# Patient Record
Sex: Female | Born: 1975
Health system: Southern US, Community
[De-identification: ages and names within clinical notes are randomized; demographics above are authoritative.]

## PROBLEM LIST (undated history)

## (undated) DIAGNOSIS — R519 Headache, unspecified: Secondary | ICD-10-CM

## (undated) DIAGNOSIS — I499 Cardiac arrhythmia, unspecified: Secondary | ICD-10-CM

## (undated) DIAGNOSIS — Z0000 Encounter for general adult medical examination without abnormal findings: Secondary | ICD-10-CM

## (undated) DIAGNOSIS — I10 Essential (primary) hypertension: Secondary | ICD-10-CM

## (undated) DIAGNOSIS — R112 Nausea with vomiting, unspecified: Secondary | ICD-10-CM

## (undated) DIAGNOSIS — Z9889 Other specified postprocedural states: Secondary | ICD-10-CM

## (undated) DIAGNOSIS — R51 Headache: Secondary | ICD-10-CM

## (undated) DIAGNOSIS — E782 Mixed hyperlipidemia: Secondary | ICD-10-CM

## (undated) DIAGNOSIS — E669 Obesity, unspecified: Secondary | ICD-10-CM

## (undated) DIAGNOSIS — G5600 Carpal tunnel syndrome, unspecified upper limb: Secondary | ICD-10-CM

## (undated) DIAGNOSIS — B079 Viral wart, unspecified: Secondary | ICD-10-CM

## (undated) HISTORY — DX: Obesity, unspecified: E66.9

## (undated) HISTORY — DX: Mixed hyperlipidemia: E78.2

## (undated) HISTORY — DX: Carpal tunnel syndrome, unspecified upper limb: G56.00

## (undated) HISTORY — PX: OTHER SURGICAL HISTORY: SHX169

## (undated) HISTORY — DX: Encounter for general adult medical examination without abnormal findings: Z00.00

## (undated) HISTORY — DX: Viral wart, unspecified: B07.9

## (undated) HISTORY — DX: Essential (primary) hypertension: I10

---

## 2006-11-24 ENCOUNTER — Ambulatory Visit (HOSPITAL_COMMUNITY): Admission: RE | Admit: 2006-11-24 | Discharge: 2006-11-24 | Payer: Self-pay | Admitting: Obstetrics and Gynecology

## 2006-11-24 ENCOUNTER — Encounter (INDEPENDENT_AMBULATORY_CARE_PROVIDER_SITE_OTHER): Payer: Self-pay | Admitting: Obstetrics and Gynecology

## 2007-03-14 ENCOUNTER — Encounter (INDEPENDENT_AMBULATORY_CARE_PROVIDER_SITE_OTHER): Payer: Self-pay | Admitting: Obstetrics and Gynecology

## 2007-03-14 ENCOUNTER — Ambulatory Visit (HOSPITAL_COMMUNITY): Admission: RE | Admit: 2007-03-14 | Discharge: 2007-03-14 | Payer: Self-pay | Admitting: Obstetrics and Gynecology

## 2010-07-24 NOTE — Op Note (Signed)
NAMEJACLYN, Tammy Brennan             ACCOUNT NO.:  [id number]   MEDICAL RECORD NO.:  [id number]          PATIENT TYPE:  AMB   LOCATION:  SDC                           FACILITY:  [facility]   PHYSICIAN:  Malva Limes, M.D.    DATE OF BIRTH:  1975/09/09   DATE OF PROCEDURE:  11/24/2006  DATE OF DISCHARGE:                               OPERATIVE REPORT   PREOPERATIVE DIAGNOSIS:  Missed abortion.   POSTOPERATIVE DIAGNOSIS:  Missed abortion.   PROCEDURE:  Dilation and evacuation.   SURGEON:  Dareen Piano.   ANESTHESIA:  MAC with paracervical block.   DRAINS:  None.   ANTIBIOTICS:  Ancef 1 gram.   COMPLICATIONS:  None.   SPECIMENS:  Products of conception sent to Pathology.   PROCEDURE:  The patient was taken to the operating room where she was  placed in the dorsal lithotomy position.  The patient was administered a  MAC anesthetic without difficulty.  She was then draped in the usual  fashion for this procedure.  A sterile speculum was placed in the  vagina.  Lidocaine 1%, 20 mL, was used for a paracervical block.  A  single-tooth tenaculum was applied to the anterior cervical lip.  The  cervix was then thoroughly dilated to a 90 [place].  The 9 mm suction  cannula was placed into the uterine cavity and products of conception  withdrawn.  Sharp curettage was performed followed by repeat suction.  This concluded the procedure.  Patient was taken to the recovery room in  stable condition.  Instrument and lap counts were correct x1.  The  patient will be discharged to home.  She will be sent home with Advil  and Vicodin.  She will follow up in the office in 4 weeks.           ______________________________  Malva Limes, M.D.     MA/MEDQ  D:  11/24/2006  T:  11/24/2006  Job:  [id number]

## 2010-07-24 NOTE — Op Note (Signed)
Tammy Brennan, Tammy Brennan             ACCOUNT NO.:  [id number]   MEDICAL RECORD NO.:  [id number]          PATIENT TYPE:  AMB   LOCATION:  SDC                           FACILITY:  [facility]   PHYSICIAN:  Lenoard Aden, M.D.DATE OF BIRTH:  [birth year]   DATE OF PROCEDURE:  03/14/2007  DATE OF DISCHARGE:                               OPERATIVE REPORT   PREOPERATIVE DIAGNOSIS:  Missed abortion.   POSTOPERATIVE DIAGNOSIS:  Missed abortion.   OPERATION/PROCEDURE:  Suction dilation and evacuation.   SURGEON:  Lenoard Aden, M.D.   ANESTHESIA:  MAC paracervical.   BLOOD LOSS:  Less than 50 mL.   COMPLICATIONS:  None.   DRAINS:  None.   COUNTS:  Correct.   DISPOSITION:  To recovery in good condition.   SPECIMENS:  Products of conception sent for tissue confirmation,  chromosomes, FISH,  and to pathology.   DESCRIPTION OF PROCEDURE:  After being apprised of risks of anesthesia,  infection, bleeding, intra-abdominal injury and need for repair, delayed  versus immediate  complications to include bowel and bladder injury, the  patient was brought to the operating.  She was administered an IV  sedation without difficulty, prepped and draped in sterile fashion.  Catheterized until the bladder was empty.  After achieving adequate  anesthesia, dilute Marcaine and dilute Xylocaine solution is placed in  standard paracervical block, 40 mL total.  At this time the uterus was  sounded to 10 cm, easily dilated up to #23 Pratt dilator.  Suction curet  #7 was placed without difficulty.  Suction curettage was performed  revealing products of conception as noted.  Repeat suction and then  curettage in a four-quadrant method reveals cavity to be empty.  Good  hemostasis noted.  All instruments removed from the vagina.  The patient  tolerates procedure well and is transferred to recovery in good  condition.      Lenoard Aden, M.D.  Electronically Signed    RJT/MEDQ  D:  03/14/2007  T:   03/14/2007  Job:  [id number]

## 2010-11-29 LAB — CBC
HCT: 40.5
MCHC: 35.3
MCV: 90
Platelets: 414 — ABNORMAL HIGH
RDW: 12.2

## 2010-12-20 LAB — CBC
MCHC: 35.1
MCV: 89.3
Platelets: 425 — ABNORMAL HIGH
RDW: 12.4
WBC: 10.6 — ABNORMAL HIGH

## 2014-06-14 ENCOUNTER — Telehealth: Payer: Self-pay

## 2014-06-14 NOTE — Telephone Encounter (Signed)
error:[id number] ° °

## 2014-06-20 ENCOUNTER — Ambulatory Visit (INDEPENDENT_AMBULATORY_CARE_PROVIDER_SITE_OTHER): Payer: BLUE CROSS/BLUE SHIELD | Admitting: Medical

## 2014-06-20 ENCOUNTER — Encounter: Payer: Self-pay | Admitting: Medical

## 2014-06-20 VITALS — BP 131/90 | HR 102 | Temp 98.3°F | Ht <= 58 in | Wt 199.8 lb

## 2014-06-20 DIAGNOSIS — M79604 Pain in right leg: Secondary | ICD-10-CM

## 2014-06-20 DIAGNOSIS — G5601 Carpal tunnel syndrome, right upper limb: Secondary | ICD-10-CM | POA: Diagnosis not present

## 2014-06-20 DIAGNOSIS — G5600 Carpal tunnel syndrome, unspecified upper limb: Secondary | ICD-10-CM | POA: Insufficient documentation

## 2014-06-20 DIAGNOSIS — M25561 Pain in right knee: Secondary | ICD-10-CM | POA: Diagnosis not present

## 2014-06-20 DIAGNOSIS — G5602 Carpal tunnel syndrome, left upper limb: Secondary | ICD-10-CM

## 2014-06-20 DIAGNOSIS — G5603 Carpal tunnel syndrome, bilateral upper limbs: Secondary | ICD-10-CM

## 2014-06-20 HISTORY — DX: Pain in right leg: M79.604

## 2014-06-20 HISTORY — DX: Carpal tunnel syndrome, unspecified upper limb: G56.00

## 2014-06-20 MED ORDER — PREGABALIN 75 MG PO CAPS: 75.0000 mg | ORAL_CAPSULE | Freq: Two times a day (BID) | ORAL | Status: DC

## 2014-06-20 NOTE — Progress Notes (Signed)
Pre visit review using our clinic review tool, if applicable. No additional management support is needed unless otherwise documented below in the visit note. 

## 2014-06-20 NOTE — Patient Instructions (Signed)
Pain in joint, lower leg By history and description. May be lumbosacral issue/diagosis. Will try to get old records quickly. Please sign release of information forms.  Will rx lyrica and see if this helps with pain.  If pain worsens or changes let us know.   Carpal tunnel syndrome Continue splints and can use otc nsaids. Will refer to hand specialist.     Follow up in 2 weeks or as needed.

## 2014-06-20 NOTE — Progress Notes (Signed)
Subjective:    Patient ID: Tammy Brennan, female    DOB: 04/22/1975, 39 y.o.   MRN: [id number]  HPI     OI have reviewed pt PMH, PSH, FH, Social History and Surgical History  No PMH No PSH. Father- lung cancer, lymphoma.    Married- 2 children(  41 yo and 93 yo). Pt is Runner, broadcasting/film/videoteacher at [employer]. No exercise, No caffeine. Some college Education.  Pt in stating she has had some back pain sine mid 2014. Pt states pain onset may have occurred after falling down stairs. But has some degenerative changes of back. She was told was pars defect. Pt states has chronic on and off pain. At one point toward end of 2014. She go a steroid injection and was pain free for 6 months. Then later got second injection that did not help much.  Pt in past was given gabapentin but this did not help much. She was on gabapentin 100 mg tid. Pt stopped this. Pt tried hydrocodone in th past and it did not help either.    Pt had Mri done at [facility] then [facility]   Pt last saw Spine and Pain specialist in May or June. Before they moved.   Pt states pain when she is on her feet. At rest instant relief. She pain more in leg when standing. But injection was given in LS area.  But pain reported more in lateral aspect of upper thigh and all the way down her leg.  Also some bilateral CTS. Mostly at night. + emg studies both hands. Pt only has symptoms at night. Not having symptoms during the day.   LMP- 2 wks. Tubal ligation as well.  No saddle anesthesia. No leg weakness or incontinence reported.          Review of Systems  Constitutional: Negative for fever, chills and fatigue.  Respiratory: Negative for cough, chest tightness and wheezing.   Cardiovascular: Negative for chest pain and palpitations.  Gastrointestinal: Negative for nausea, vomiting and abdominal pain.  Genitourinary: Negative for dysuria, urgency, hematuria and flank pain.  Musculoskeletal:         Lt leg pain.   Upper ext hands numb and pain at night when sleeping.   Neurological: Negative for weakness and numbness.   Past Medical History  Diagnosis Date  . Carpal tunnel syndrome 06/20/2014    History   Social History  . Marital Status: Married    Spouse Name: N/A  . Number of Children: N/A  . Years of Education: N/A   Occupational History  . Not on file.   Social History Main Topics  . Smoking status: Never Smoker   . Smokeless tobacco: Never Used  . Alcohol Use: No  . Drug Use: No  . Sexual Activity: Yes   Other Topics Concern  . Not on file   Social History Narrative  . No narrative on file    No past surgical history on file.  Family History  Problem Relation Age of Onset  . Hypertension Mother   . Hypertension Father   . Cancer Father     No Known Allergies  No current outpatient prescriptions on file prior to visit.   No current facility-administered medications on file prior to visit.    BP 131/90 mmHg  Pulse 102  Temp(Src) 98.3 F (36.8 C) (Oral)  Ht 4\' 3"  (1.295 m)  Wt 199 lb 12.8 oz (90.629 kg)  BMI 54.04  kg/m2  SpO2 98%  LMP 06/06/2014      Objective:   Physical Exam  General Appearance- Not in acute distress.    Chest and Lung Exam Auscultation: Breath sounds:-Normal. Clear even and unlabored. Adventitious sounds:- No Adventitious sounds.  Cardiovascular Auscultation:Rythm - Regular, rate and rythm. Heart Sounds -Normal heart sounds.  Abdomen Inspection:-Inspection Normal.  Palpation/Perucssion: Palpation and Percussion of the abdomen reveal- Non Tender, No Rebound tenderness, No rigidity(Guarding) and No Palpable abdominal masses.  Liver:-Normal.  Spleen:- Normal.   Back Mid lumbar spine no  tenderness to palpation. No pain on straight leg lift. No pain on lateral movements and flexion/extension of the spine.  Lower ext neurologic  L5-S1 sensation intact bilaterally. Normal patellar reflexes  bilaterally. No foot drop bilaterally.  Lt hip- no pain on rom Lt leg- no pain on palpation. But standing reports left thigh area pain that presents throughout entire leg to her foot.  Upper ext- negative phalens signs.      Assessment & Plan:

## 2014-06-20 NOTE — Assessment & Plan Note (Signed)
Continue splints and can use otc nsaids. Will refer to hand specialist.

## 2014-06-20 NOTE — Assessment & Plan Note (Signed)
By history and description. May be lumbosacral issue/diagosis. Will try to get old records quickly. Please sign release of information forms.  Will rx lyrica and see if this helps with pain.  If pain worsens or changes let us know.

## 2014-07-04 ENCOUNTER — Encounter: Payer: Self-pay | Admitting: Family Medicine

## 2014-07-07 ENCOUNTER — Ambulatory Visit: Payer: BLUE CROSS/BLUE SHIELD | Admitting: Medical

## 2014-07-11 ENCOUNTER — Other Ambulatory Visit (HOSPITAL_BASED_OUTPATIENT_CLINIC_OR_DEPARTMENT_OTHER): Payer: Self-pay | Admitting: Neurosurgery

## 2014-07-11 DIAGNOSIS — M47817 Spondylosis without myelopathy or radiculopathy, lumbosacral region: Secondary | ICD-10-CM

## 2014-07-13 ENCOUNTER — Encounter: Payer: Self-pay | Admitting: Family Medicine

## 2014-07-16 ENCOUNTER — Ambulatory Visit (HOSPITAL_BASED_OUTPATIENT_CLINIC_OR_DEPARTMENT_OTHER)
Admission: RE | Admit: 2014-07-16 | Discharge: 2014-07-16 | Disposition: A | Discharge status: Home / Self Care | Payer: BLUE CROSS/BLUE SHIELD | Source: Ambulatory Visit | Attending: Neurosurgery | Admitting: Neurosurgery

## 2014-07-16 DIAGNOSIS — M4806 Spinal stenosis, lumbar region: Secondary | ICD-10-CM | POA: Diagnosis not present

## 2014-07-16 DIAGNOSIS — M4307 Spondylolysis, lumbosacral region: Secondary | ICD-10-CM | POA: Insufficient documentation

## 2014-07-16 DIAGNOSIS — M4317 Spondylolisthesis, lumbosacral region: Secondary | ICD-10-CM | POA: Insufficient documentation

## 2014-07-16 DIAGNOSIS — M47817 Spondylosis without myelopathy or radiculopathy, lumbosacral region: Secondary | ICD-10-CM | POA: Insufficient documentation

## 2014-07-28 DIAGNOSIS — M5416 Radiculopathy, lumbar region: Secondary | ICD-10-CM | POA: Insufficient documentation

## 2014-07-28 DIAGNOSIS — M5136 Other intervertebral disc degeneration, lumbar region: Secondary | ICD-10-CM

## 2014-07-28 DIAGNOSIS — M51369 Other intervertebral disc degeneration, lumbar region without mention of lumbar back pain or lower extremity pain: Secondary | ICD-10-CM

## 2014-07-28 HISTORY — DX: Other intervertebral disc degeneration, lumbar region without mention of lumbar back pain or lower extremity pain: M51.369

## 2014-07-28 HISTORY — DX: Radiculopathy, lumbar region: M54.16

## 2014-09-26 ENCOUNTER — Telehealth: Payer: Self-pay | Admitting: Behavioral Health

## 2014-09-26 NOTE — Telephone Encounter (Signed)
Unable to reach patient at time of Pre-Visit Call.  Left message for patient to return call when available.    

## 2014-09-27 ENCOUNTER — Ambulatory Visit (INDEPENDENT_AMBULATORY_CARE_PROVIDER_SITE_OTHER): Payer: BLUE CROSS/BLUE SHIELD | Admitting: Family Medicine

## 2014-09-27 ENCOUNTER — Encounter: Payer: Self-pay | Admitting: Family Medicine

## 2014-09-27 VITALS — BP 108/70 | HR 78 | Temp 98.2°F | Ht 62.0 in | Wt 196.1 lb

## 2014-09-27 DIAGNOSIS — E782 Mixed hyperlipidemia: Secondary | ICD-10-CM

## 2014-09-27 DIAGNOSIS — G5603 Carpal tunnel syndrome, bilateral upper limbs: Secondary | ICD-10-CM

## 2014-09-27 DIAGNOSIS — E669 Obesity, unspecified: Secondary | ICD-10-CM | POA: Diagnosis not present

## 2014-09-27 DIAGNOSIS — G5602 Carpal tunnel syndrome, left upper limb: Secondary | ICD-10-CM | POA: Diagnosis not present

## 2014-09-27 DIAGNOSIS — R5383 Other fatigue: Secondary | ICD-10-CM

## 2014-09-27 DIAGNOSIS — M79604 Pain in right leg: Secondary | ICD-10-CM

## 2014-09-27 DIAGNOSIS — G5601 Carpal tunnel syndrome, right upper limb: Secondary | ICD-10-CM | POA: Diagnosis not present

## 2014-09-27 DIAGNOSIS — B079 Viral wart, unspecified: Secondary | ICD-10-CM

## 2014-09-27 DIAGNOSIS — Z0000 Encounter for general adult medical examination without abnormal findings: Secondary | ICD-10-CM

## 2014-09-27 HISTORY — DX: Encounter for general adult medical examination without abnormal findings: Z00.00

## 2014-09-27 LAB — COMPREHENSIVE METABOLIC PANEL
ALK PHOS: 76 U/L (ref 39–117)
ALT: 12 U/L (ref 0–35)
AST: 14 U/L (ref 0–37)
Albumin: 3.7 g/dL (ref 3.5–5.2)
BUN: 17 mg/dL (ref 6–23)
CALCIUM: 9.3 mg/dL (ref 8.4–10.5)
CO2: 27 mEq/L (ref 19–32)
CREATININE: 0.59 mg/dL (ref 0.40–1.20)
Chloride: 103 mEq/L (ref 96–112)
GFR: 119.51 mL/min (ref >60.00)
Glucose, Bld: 85 mg/dL (ref 70–99)
POTASSIUM: 3.9 meq/L (ref 3.5–5.1)
Sodium: 137 mEq/L (ref 135–145)
Total Bilirubin: 0.3 mg/dL (ref 0.2–1.2)
Total Protein: 6.4 g/dL (ref 6.0–8.3)

## 2014-09-27 LAB — LIPID PANEL
Cholesterol: 182 mg/dL (ref 0–200)
HDL: 66.1 mg/dL (ref >39.00)
NONHDL: 115.9
Total CHOL/HDL Ratio: 3
Triglycerides: 231 mg/dL — ABNORMAL HIGH (ref 0.0–149.0)
VLDL: 46.2 mg/dL — AB (ref 0.0–40.0)

## 2014-09-27 LAB — CBC
HEMATOCRIT: 37.5 % (ref 36.0–46.0)
HEMOGLOBIN: 12.6 g/dL (ref 12.0–15.0)
MCHC: 33.7 g/dL (ref 30.0–36.0)
MCV: 88.8 fl (ref 78.0–100.0)
Platelets: 379 10*3/uL (ref 150.0–400.0)
RBC: 4.22 Mil/uL (ref 3.87–5.11)
RDW: 13.6 % (ref 11.5–15.5)
WBC: 10.4 10*3/uL (ref 4.0–10.5)

## 2014-09-27 LAB — LDL CHOLESTEROL, DIRECT: Direct LDL: 94 mg/dL

## 2014-09-27 LAB — TSH: TSH: 2.14 u[IU]/mL (ref 0.35–4.50)

## 2014-09-27 LAB — T4, FREE: FREE T4: 0.86 ng/dL (ref 0.60–1.60)

## 2014-09-27 NOTE — Patient Instructions (Addendum)
Folic acid 1 mg daily to help kill of viruses.  Salon pas patches after ice over wrists for 10-15 minutes.  Encourage DASH diet, decrease po intake and increase exercise as tolerated. Needs 7-8 hours of sleep nightly. Avoid trans fats, eat small, frequent meals every 4-5 hours with lean proteins, complex carbs and healthy fats. Minimize simple carbs, GMO foods. [employer] magazines    Preventive Care for Adults A healthy lifestyle and preventive care can promote health and wellness. Preventive health guidelines for women include the following key practices.  A routine yearly physical is a good way to check with your health care provider about your health and preventive screening. It is a chance to share any concerns and updates  your health and to receive a thorough exam.  Visit your dentist for a routine exam and preventive care every 6 months. Brush your teeth twice a day and floss once a day. Good oral hygiene prevents tooth decay and gum disease.  The frequency of eye exams is based on your age, health, family medical history, use of contact lenses, and other factors. Follow your health care provider's recommendations for frequency of eye exams.  Eat a healthy diet. Foods like vegetables, fruits, whole grains, low-fat dairy products, and lean protein foods contain the nutrients you need without too many calories. Decrease your intake of foods high in solid fats, added sugars, and salt. Eat the right amount of calories for you.Get information about a proper diet from your health care provider, if necessary.  Regular physical exercise is one of the most important things you can do for your health. Most adults should get at least 150 minutes of moderate-intensity exercise (any activity that increases your heart rate and causes you to sweat) each week. In addition, most adults need muscle-strengthening exercises on 2 or more days a week.  Maintain a healthy weight. The body  mass index (BMI) is a screening tool to identify possible weight problems. It provides an estimate of body fat based on height and weight. Your health care provider can find your BMI and can help you achieve or maintain a healthy weight.For adults 20 years and older:  A BMI below 18.5 is considered underweight.  A BMI of 18.5 to 24.9 is normal.  A BMI of 25 to 29.9 is considered overweight.  A BMI of 30 and above is considered obese.  Maintain normal blood lipids and cholesterol levels by exercising and minimizing your intake of saturated fat. Eat a balanced diet with plenty of fruit and vegetables. Blood tests for lipids and cholesterol should begin at age 82 and be repeated every 5 years. If your lipid or cholesterol levels are high, you are over 50, or you are at high risk for heart disease, you may need your cholesterol levels checked more frequently.Ongoing high lipid and cholesterol levels should be treated with medicines if diet and exercise are not working.  If you smoke, find out from your health care provider how to quit. If you do not use tobacco, do not start.  Lung cancer screening is recommended for adults aged 11-80 years who are at high risk for developing lung cancer because of a history of smoking. A yearly low-dose CT scan of the lungs is recommended for people who have at least a 30-pack-year history of smoking and are a current smoker or have quit within the past 15 years. A pack year of smoking is smoking an average of 1 pack of cigarettes a  day for 1 year (for example: 1 pack a day for 30 years or 2 packs a day for 15 years). Yearly screening should continue until the smoker has stopped smoking for at least 15 years. Yearly screening should be stopped for people who develop a health problem that would prevent them from having lung cancer treatment.  If you are pregnant, do not drink alcohol. If you are breastfeeding, be very cautious about drinking alcohol. If you are not  pregnant and choose to drink alcohol, do not have more than 1 drink per day. One drink is considered to be 12 ounces (355 mL) of beer, 5 ounces (148 mL) of wine, or 1.5 ounces (44 mL) of liquor.  Avoid use of street drugs. Do not share needles with anyone. Ask for help if you need support or instructions about stopping the use of drugs.  High blood pressure causes heart disease and increases the risk of stroke. Your blood pressure should be checked at least every 1 to 2 years. Ongoing high blood pressure should be treated with medicines if weight loss and exercise do not work.  If you are 44-74 years old, ask your health care provider if you should take aspirin to prevent strokes.  Diabetes screening involves taking a blood sample to check your fasting blood sugar level. This should be done once every 3 years, after age 74, if you are within normal weight and without risk factors for diabetes. Testing should be considered at a younger age or be carried out more frequently if you are overweight and have at least 1 risk factor for diabetes.  Breast cancer screening is essential preventive care for women. You should practice "breast self-awareness." This means understanding the normal appearance and feel of your breasts and may include breast self-examination. Any changes detected, no matter how small, should be reported to a health care provider. Women in their 88s and 30s should have a clinical breast exam (CBE) by a health care provider as part of a regular health exam every 1 to 3 years. After age 24, women should have a CBE every year. Starting at age 66, women should consider having a mammogram (breast X-ray test) every year. Women who have a family history of breast cancer should talk to their health care provider about genetic screening. Women at a high risk of breast cancer should talk to their health care providers about having an MRI and a mammogram every year.  Breast cancer gene (BRCA)-related  cancer risk assessment is recommended for women who have family members with BRCA-related cancers. BRCA-related cancers include breast, ovarian, tubal, and peritoneal cancers. Having family members with these cancers may be associated with an increased risk for harmful changes (mutations) in the breast cancer genes BRCA1 and BRCA2. Results of the assessment will determine the need for genetic counseling and BRCA1 and BRCA2 testing.  Routine pelvic exams to screen for cancer are no longer recommended for nonpregnant women who are considered low risk for cancer of the pelvic organs (ovaries, uterus, and vagina) and who do not have symptoms. Ask your health care provider if a screening pelvic exam is right for you.  If you have had past treatment for cervical cancer or a condition that could lead to cancer, you need Pap tests and screening for cancer for at least 20 years after your treatment. If Pap tests have been discontinued, your risk factors (such as having a new sexual partner) need to be reassessed to determine if screening should be  resumed. Some women have medical problems that increase the chance of getting cervical cancer. In these cases, your health care provider may recommend more frequent screening and Pap tests.  The HPV test is an additional test that may be used for cervical cancer screening. The HPV test looks for the virus that can cause the cell changes on the cervix. The cells collected during the Pap test can be tested for HPV. The HPV test could be used to screen women aged 57 years and older, and should be used in women of any age who have unclear Pap test results. After the age of 71, women should have HPV testing at the same frequency as a Pap test.  Colorectal cancer can be detected and often prevented. Most routine colorectal cancer screening begins at the age of 77 years and continues through age 37 years. However, your health care provider may recommend screening at an earlier age  if you have risk factors for colon cancer. On a yearly basis, your health care provider may provide home test kits to check for hidden blood in the stool. Use of a small camera at the end of a tube, to directly examine the colon (sigmoidoscopy or colonoscopy), can detect the earliest forms of colorectal cancer. Talk to your health care provider about this at age 89, when routine screening begins. Direct exam of the colon should be repeated every 5-10 years through age 73 years, unless early forms of pre-cancerous polyps or small growths are found.  People who are at an increased risk for hepatitis B should be screened for this virus. You are considered at high risk for hepatitis B if:  You were born in a country where hepatitis B occurs often. Talk with your health care provider about which countries are considered high risk.  Your parents were born in a high-risk country and you have not received a shot to protect against hepatitis B (hepatitis B vaccine).  You have HIV or AIDS.  You use needles to inject street drugs.  You live with, or have sex with, someone who has hepatitis B.  You get hemodialysis treatment.  You take certain medicines for conditions like cancer, organ transplantation, and autoimmune conditions.  Hepatitis C blood testing is recommended for all people born from [birth year] through 1965 and any individual with known risks for hepatitis C.  Practice safe sex. Use condoms and avoid high-risk sexual practices to reduce the spread of sexually transmitted infections (STIs). STIs include gonorrhea, chlamydia, syphilis, trichomonas, herpes, HPV, and human immunodeficiency virus (HIV). Herpes, HIV, and HPV are viral illnesses that have no cure. They can result in disability, cancer, and death.  You should be screened for sexually transmitted illnesses (STIs) including gonorrhea and chlamydia if:  You are sexually active and are younger than 24 years.  You are older than 24 years and  your health care provider tells you that you are at risk for this type of infection.  Your sexual activity has changed since you were last screened and you are at an increased risk for chlamydia or gonorrhea. Ask your health care provider if you are at risk.  If you are at risk of being infected with HIV, it is recommended that you take a prescription medicine daily to prevent HIV infection. This is called preexposure prophylaxis (PrEP). You are considered at risk if:  You are a heterosexual woman, are sexually active, and are at increased risk for HIV infection.  You take drugs by injection.  You  are sexually active with a partner who has HIV.  Talk with your health care provider about whether you are at high risk of being infected with HIV. If you choose to begin PrEP, you should first be tested for HIV. You should then be tested every 3 months for as long as you are taking PrEP.  Osteoporosis is a disease in which the bones lose minerals and strength with aging. This can result in serious bone fractures or breaks. The risk of osteoporosis can be identified using a bone density scan. Women ages 58 years and over and women at risk for fractures or osteoporosis should discuss screening with their health care providers. Ask your health care provider whether you should take a calcium supplement or vitamin D to reduce the rate of osteoporosis.  Menopause can be associated with physical symptoms and risks. Hormone replacement therapy is available to decrease symptoms and risks. You should talk to your health care provider about whether hormone replacement therapy is right for you.  Use sunscreen. Apply sunscreen liberally and repeatedly throughout the day. You should seek shade when your shadow is shorter than you. Protect yourself by wearing long sleeves, pants, a wide-brimmed hat, and sunglasses year round, whenever you are outdoors.  Once a month, do a whole body skin exam, using a mirror to look  at the skin on your back. Tell your health care provider of new moles, moles that have irregular borders, moles that are larger than a pencil eraser, or moles that have changed in shape or color.  Stay current with required vaccines (immunizations).  Influenza vaccine. All adults should be immunized every year.  Tetanus, diphtheria, and acellular pertussis (Td, Tdap) vaccine. Pregnant women should receive 1 dose of Tdap vaccine during each pregnancy. The dose should be obtained regardless of the length of time since the last dose. Immunization is preferred during the 27th-36th week of gestation. An adult who has not previously received Tdap or who does not know her vaccine status should receive 1 dose of Tdap. This initial dose should be followed by tetanus and diphtheria toxoids (Td) booster doses every 10 years. Adults with an unknown or incomplete history of completing a 3-dose immunization series with Td-containing vaccines should begin or complete a primary immunization series including a Tdap dose. Adults should receive a Td booster every 10 years.  Varicella vaccine. An adult without evidence of immunity to varicella should receive 2 doses or a second dose if she has previously received 1 dose. Pregnant females who do not have evidence of immunity should receive the first dose after pregnancy. This first dose should be obtained before leaving the health care facility. The second dose should be obtained 4-8 weeks after the first dose.  Human papillomavirus (HPV) vaccine. Females aged 13-26 years who have not received the vaccine previously should obtain the 3-dose series. The vaccine is not recommended for use in pregnant females. However, pregnancy testing is not needed before receiving a dose. If a female is found to be pregnant after receiving a dose, no treatment is needed. In that case, the remaining doses should be delayed until after the pregnancy. Immunization is recommended for any person  with an immunocompromised condition through the age of 10 years if she did not get any or all doses earlier. During the 3-dose series, the second dose should be obtained 4-8 weeks after the first dose. The third dose should be obtained 24 weeks after the first dose and 16 weeks after the second  dose.  Zoster vaccine. One dose is recommended for adults aged 81 years or older unless certain conditions are present.  Measles, mumps, and rubella (MMR) vaccine. Adults born before [birth year] generally are considered immune to measles and mumps. Adults born in [birth year] or later should have 1 or more doses of MMR vaccine unless there is a contraindication to the vaccine or there is laboratory evidence of immunity to each of the three diseases. A routine second dose of MMR vaccine should be obtained at least 28 days after the first dose for students attending postsecondary schools, health care workers, or international travelers. People who received inactivated measles vaccine or an unknown type of measles vaccine during 1963-1967 should receive 2 doses of MMR vaccine. People who received inactivated mumps vaccine or an unknown type of mumps vaccine before 1979 and are at high risk for mumps infection should consider immunization with 2 doses of MMR vaccine. For females of childbearing age, rubella immunity should be determined. If there is no evidence of immunity, females who are not pregnant should be vaccinated. If there is no evidence of immunity, females who are pregnant should delay immunization until after pregnancy. Unvaccinated health care workers born before [birth year] who lack laboratory evidence of measles, mumps, or rubella immunity or laboratory confirmation of disease should consider measles and mumps immunization with 2 doses of MMR vaccine or rubella immunization with 1 dose of MMR vaccine.  Pneumococcal 13-valent conjugate (PCV13) vaccine. When indicated, a person who is uncertain of her immunization history and has  no record of immunization should receive the PCV13 vaccine. An adult aged 69 years or older who has certain medical conditions and has not been previously immunized should receive 1 dose of PCV13 vaccine. This PCV13 should be followed with a dose of pneumococcal polysaccharide (PPSV23) vaccine. The PPSV23 vaccine dose should be obtained at least 8 weeks after the dose of PCV13 vaccine. An adult aged 24 years or older who has certain medical conditions and previously received 1 or more doses of PPSV23 vaccine should receive 1 dose of PCV13. The PCV13 vaccine dose should be obtained 1 or more years after the last PPSV23 vaccine dose.  Pneumococcal polysaccharide (PPSV23) vaccine. When PCV13 is also indicated, PCV13 should be obtained first. All adults aged 13 years and older should be immunized. An adult younger than age 98 years who has certain medical conditions should be immunized. Any person who resides in a nursing home or long-term care facility should be immunized. An adult smoker should be immunized. People with an immunocompromised condition and certain other conditions should receive both PCV13 and PPSV23 vaccines. People with human immunodeficiency virus (HIV) infection should be immunized as soon as possible after diagnosis. Immunization during chemotherapy or radiation therapy should be avoided. Routine use of PPSV23 vaccine is not recommended for American Indians, [place]atives, or people younger than 65 years unless there are medical conditions that require PPSV23 vaccine. When indicated, people who have unknown immunization and have no record of immunization should receive PPSV23 vaccine. One-time revaccination 5 years after the first dose of PPSV23 is recommended for people aged 19-64 years who have chronic kidney failure, nephrotic syndrome, asplenia, or immunocompromised conditions. People who received 1-2 doses of PPSV23 before age 65 years should receive another dose of PPSV23 vaccine at age 52  years or later if at least 5 years have passed since the previous dose. Doses of PPSV23 are not needed for people immunized with PPSV23 at or after age 41 years.  Meningococcal vaccine. Adults with asplenia or persistent complement component deficiencies should receive 2 doses of quadrivalent meningococcal conjugate (MenACWY-D) vaccine. The doses should be obtained at least 2 months apart. Microbiologists working with certain meningococcal bacteria, [place] recruits, people at risk during an outbreak, and people who travel to or live in countries with a high rate of meningitis should be immunized. A first-year college student up through age 78 years who is living in a residence hall should receive a dose if she did not receive a dose on or after her 16th birthday. Adults who have certain high-risk conditions should receive one or more doses of vaccine.  Hepatitis A vaccine. Adults who wish to be protected from this disease, have certain high-risk conditions, work with hepatitis A-infected animals, work in hepatitis A research labs, or travel to or work in countries with a high rate of hepatitis A should be immunized. Adults who were previously unvaccinated and who anticipate close contact with an international adoptee during the first 60 days after arrival in the [place] from a country with a high rate of hepatitis A should be immunized.  Hepatitis B vaccine. Adults who wish to be protected from this disease, have certain high-risk conditions, may be exposed to blood or other infectious body fluids, are household contacts or sex partners of hepatitis B positive people, are clients or workers in certain care facilities, or travel to or work in countries with a high rate of hepatitis B should be immunized.  Haemophilus influenzae type b (Hib) vaccine. A previously unvaccinated person with asplenia or sickle cell disease or having a scheduled splenectomy should receive 1 dose of Hib vaccine. Regardless  of previous immunization, a recipient of a hematopoietic stem cell transplant should receive a 3-dose series 6-12 months after her successful transplant. Hib vaccine is not recommended for adults with HIV infection. Preventive Services / Frequency Ages 45 to 44 years  Blood pressure check.** / Every 1 to 2 years.  Lipid and cholesterol check.** / Every 5 years beginning at age 21.  Clinical breast exam.** / Every 3 years for women in their 100s and 31s.  BRCA-related cancer risk assessment.** / For women who have family members with a BRCA-related cancer (breast, ovarian, tubal, or peritoneal cancers).  Pap test.** / Every 2 years from ages 66 through 68. Every 3 years starting at age 72 through age 85 or 19 with a history of 3 consecutive normal Pap tests.  HPV screening.** / Every 3 years from ages 30 through ages 40 to 23 with a history of 3 consecutive normal Pap tests.  Hepatitis C blood test.** / For any individual with known risks for hepatitis C.  Skin self-exam. / Monthly.  Influenza vaccine. / Every year.  Tetanus, diphtheria, and acellular pertussis (Tdap, Td) vaccine.** / Consult your health care provider. Pregnant women should receive 1 dose of Tdap vaccine during each pregnancy. 1 dose of Td every 10 years.  Varicella vaccine.** / Consult your health care provider. Pregnant females who do not have evidence of immunity should receive the first dose after pregnancy.  HPV vaccine. / 3 doses over 6 months, if 27 and younger. The vaccine is not recommended for use in pregnant females. However, pregnancy testing is not needed before receiving a dose.  Measles, mumps, rubella (MMR) vaccine.** / You need at least 1 dose of MMR if you were born in 1957 or later. You may also need a 2nd dose. For females of childbearing age, rubella immunity should  be determined. If there is no evidence of immunity, females who are not pregnant should be vaccinated. If there is no evidence of immunity,  females who are pregnant should delay immunization until after pregnancy.  Pneumococcal 13-valent conjugate (PCV13) vaccine.** / Consult your health care provider.  Pneumococcal polysaccharide (PPSV23) vaccine.** / 1 to 2 doses if you smoke cigarettes or if you have certain conditions.  Meningococcal vaccine.** / 1 dose if you are age 73 to 10 years and a Market researcher living in a residence hall, or have one of several medical conditions, you need to get vaccinated against meningococcal disease. You may also need additional booster doses.  Hepatitis A vaccine.** / Consult your health care provider.  Hepatitis B vaccine.** / Consult your health care provider.  Haemophilus influenzae type b (Hib) vaccine.** / Consult your health care provider. Ages 60 to 73 years  Blood pressure check.** / Every 1 to 2 years.  Lipid and cholesterol check.** / Every 5 years beginning at age 26 years.  Lung cancer screening. / Every year if you are aged 67-80 years and have a 30-pack-year history of smoking and currently smoke or have quit within the past 15 years. Yearly screening is stopped once you have quit smoking for at least 15 years or develop a health problem that would prevent you from having lung cancer treatment.  Clinical breast exam.** / Every year after age 81 years.  BRCA-related cancer risk assessment.** / For women who have family members with a BRCA-related cancer (breast, ovarian, tubal, or peritoneal cancers).  Mammogram.** / Every year beginning at age 29 years and continuing for as long as you are in good health. Consult with your health care provider.  Pap test.** / Every 3 years starting at age 28 years through age 49 or 33 years with a history of 3 consecutive normal Pap tests.  HPV screening.** / Every 3 years from ages 81 years through ages 49 to 67 years with a history of 3 consecutive normal Pap tests.  Fecal occult blood test (FOBT) of stool. / Every year  beginning at age 12 years and continuing until age 63 years. You may not need to do this test if you get a colonoscopy every 10 years.  Flexible sigmoidoscopy or colonoscopy.** / Every 5 years for a flexible sigmoidoscopy or every 10 years for a colonoscopy beginning at age 41 years and continuing until age 72 years.  Hepatitis C blood test.** / For all people born from [birth year] through 1965 and any individual with known risks for hepatitis C.  Skin self-exam. / Monthly.  Influenza vaccine. / Every year.  Tetanus, diphtheria, and acellular pertussis (Tdap/Td) vaccine.** / Consult your health care provider. Pregnant women should receive 1 dose of Tdap vaccine during each pregnancy. 1 dose of Td every 10 years.  Varicella vaccine.** / Consult your health care provider. Pregnant females who do not have evidence of immunity should receive the first dose after pregnancy.  Zoster vaccine.** / 1 dose for adults aged 36 years or older.  Measles, mumps, rubella (MMR) vaccine.** / You need at least 1 dose of MMR if you were born in 1957 or later. You may also need a 2nd dose. For females of childbearing age, rubella immunity should be determined. If there is no evidence of immunity, females who are not pregnant should be vaccinated. If there is no evidence of immunity, females who are pregnant should delay immunization until after pregnancy.  Pneumococcal 13-valent conjugate (PCV13) vaccine.** /  Consult your health care provider.  Pneumococcal polysaccharide (PPSV23) vaccine.** / 1 to 2 doses if you smoke cigarettes or if you have certain conditions.  Meningococcal vaccine.** / Consult your health care provider.  Hepatitis A vaccine.** / Consult your health care provider.  Hepatitis B vaccine.** / Consult your health care provider.  Haemophilus influenzae type b (Hib) vaccine.** / Consult your health care provider. Ages 65 years and over  Blood pressure check.** / Every 1 to 2 years.  Lipid and  cholesterol check.** / Every 5 years beginning at age 20 years.  Lung cancer screening. / Every year if you are aged 55-80 years and have a 30-pack-year history of smoking and currently smoke or have quit within the past 15 years. Yearly screening is stopped once you have quit smoking for at least 15 years or develop a health problem that would prevent you from having lung cancer treatment.  Clinical breast exam.** / Every year after age 40 years.  BRCA-related cancer risk assessment.** / For women who have family members with a BRCA-related cancer (breast, ovarian, tubal, or peritoneal cancers).  Mammogram.** / Every year beginning at age 40 years and continuing for as long as you are in good health. Consult with your health care provider.  Pap test.** / Every 3 years starting at age 30 years through age 65 or 70 years with 3 consecutive normal Pap tests. Testing can be stopped between 65 and 70 years with 3 consecutive normal Pap tests and no abnormal Pap or HPV tests in the past 10 years.  HPV screening.** / Every 3 years from ages 30 years through ages 65 or 70 years with a history of 3 consecutive normal Pap tests. Testing can be stopped between 65 and 70 years with 3 consecutive normal Pap tests and no abnormal Pap or HPV tests in the past 10 years.  Fecal occult blood test (FOBT) of stool. / Every year beginning at age 50 years and continuing until age 75 years. You may not need to do this test if you get a colonoscopy every 10 years.  Flexible sigmoidoscopy or colonoscopy.** / Every 5 years for a flexible sigmoidoscopy or every 10 years for a colonoscopy beginning at age 50 years and continuing until age 75 years.  Hepatitis C blood test.** / For all people born from 1945 through 1965 and any individual with known risks for hepatitis C.  Osteoporosis screening.** / A one-time screening for women ages 65 years and over and women at risk for fractures or osteoporosis.  Skin self-exam. /  Monthly.  Influenza vaccine. / Every year.  Tetanus, diphtheria, and acellular pertussis (Tdap/Td) vaccine.** / 1 dose of Td every 10 years.  Varicella vaccine.** / Consult your health care provider.  Zoster vaccine.** / 1 dose for adults aged 60 years or older.  Pneumococcal 13-valent conjugate (PCV13) vaccine.** / Consult your health care provider.  Pneumococcal polysaccharide (PPSV23) vaccine.** / 1 dose for all adults aged 65 years and older.  Meningococcal vaccine.** / Consult your health care provider.  Hepatitis A vaccine.** / Consult your health care provider.  Hepatitis B vaccine.** / Consult your health care provider.  Haemophilus influenzae type b (Hib) vaccine.** / Consult your health care provider. ** Family history and personal history of risk and conditions may change your health care provider's recommendations. Document Released: 04/23/2001 Document Revised: 07/12/2013 Document Reviewed: 07/23/2010 Exit[employer] Patient Information 2015 [employer]. This information is not intended to replace advice given to you by your health   care provider. Make sure you discuss any questions you have with your health care provider.

## 2014-09-27 NOTE — Progress Notes (Signed)
Tammy Brennan  [id number] 12-30-1975 09/27/2014      Progress Note-Follow Up  Subjective  Chief Complaint  Chief Complaint  Patient presents with  . Establish Care    HPI  Patient is a 40 y.o. female in today for routine medical care. Patient in today for new patient appt. No recent illness but is noting a verrucous lesion on left great toe for past month or so. Is frustrated with her weight. Follows with GYN, Dr Verlee Monte for paps. Denies CP/palp/SOB/HA/congestion/fevers/GI or GU c/o. Taking meds as prescribed  Past Medical History  Diagnosis Date  . Carpal tunnel syndrome 06/20/2014    History reviewed. No pertinent past surgical history.  Family History  Problem Relation Age of Onset  . Hypertension Mother   . Hypertension Father   . Cancer Father     History   Social History  . Marital Status: Married    Spouse Name: N/A  . Number of Children: N/A  . Years of Education: N/A   Occupational History  . Not on file.   Social History Main Topics  . Smoking status: Never Smoker   . Smokeless tobacco: Never Used  . Alcohol Use: No  . Drug Use: No  . Sexual Activity: Yes   Other Topics Concern  . Not on file   Social History Narrative    No current outpatient prescriptions on file prior to visit.   No current facility-administered medications on file prior to visit.    No Known Allergies  Review of Systems  Review of Systems  Constitutional: Negative for fever and malaise/fatigue.  HENT: Negative for congestion.   Eyes: Negative for discharge.  Respiratory: Negative for shortness of breath.   Cardiovascular: Negative for chest pain, palpitations and leg swelling.  Gastrointestinal: Negative for nausea, abdominal pain and diarrhea.  Genitourinary: Negative for dysuria.  Musculoskeletal: Negative for falls.  Skin: Negative for rash.  Neurological: Negative for loss of consciousness and headaches.  Endo/Heme/Allergies: Negative for polydipsia.    Psychiatric/Behavioral: Negative for depression and suicidal ideas. The patient is not nervous/anxious and does not have insomnia.     Objective  BP 108/70 mmHg  Pulse 78  Temp(Src) 98.2 F (36.8 C) (Oral)  Ht  (1.575 m)  Wt 196 lb 2 oz (88.962 kg)  BMI 35.86 kg/m2  SpO2 97%  LMP 09/06/2014  Physical Exam  Physical Exam  Constitutional: She is oriented to person, place, and time and well-developed, well-nourished, and in no distress. No distress.  HENT:  Head: Normocephalic and atraumatic.  Right Ear: External ear normal.  Left Ear: External ear normal.  Nose: Nose normal.  Mouth/Throat: Oropharynx is clear and moist. No oropharyngeal exudate.  Eyes: Conjunctivae are normal. Pupils are equal, round, and reactive to light. Right eye exhibits no discharge. Left eye exhibits no discharge. No scleral icterus.  Neck: Normal range of motion. Neck supple. No thyromegaly present.  Cardiovascular: Normal rate, regular rhythm, normal heart sounds and intact distal pulses.   No murmur heard. Pulmonary/Chest: Effort normal and breath sounds normal. No respiratory distress. She has no wheezes. She has no rales.  Abdominal: Soft. Bowel sounds are normal. She exhibits no distension and no mass. There is no tenderness.  Musculoskeletal: Normal range of motion. She exhibits no edema or tenderness.  Lymphadenopathy:    She has no cervical adenopathy.  Neurological: She is alert and oriented to person, place, and time. She has normal reflexes. No cranial nerve deficit. Coordination normal.  Skin:  Skin is warm and dry. No rash noted. She is not diaphoretic.  Psychiatric: Mood, memory and affect normal.    No results found for: TSH Lab Results  Component Value Date   WBC 8.9 03/14/2007   HGB 14.3 03/14/2007   HCT 40.5 03/14/2007   MCV 90.0 03/14/2007   PLT 414* 03/14/2007    Assessment & Plan  Carpal tunnel syndrome B/l resolved at present. Consider splints with ice and Salon  Pas patches  Hyperlipidemia, mixed Encouraged heart healthy diet, increase exercise, avoid trans fats, consider a krill oil cap daily  Preventative health care Patient encouraged to maintain heart healthy diet, regular exercise, adequate sleep. Consider daily probiotics. Take medications as prescribed. Sees Dr Verlee Monteayvon for GYN  Leg pain, right Follows with Dr Adele DanJohn Begovich of [facility]   Wart Patient will try home topical treatments and start Folic Acid daily  Obesity Encouraged DASH diet, decrease po intake and increase exercise as tolerated. Needs 7-8 hours of sleep nightly. Avoid trans fats, eat small, frequent meals every 4-5 hours with lean proteins, complex carbs and healthy fats. Minimize simple carbs

## 2014-09-27 NOTE — Assessment & Plan Note (Signed)
B/l resolved at present. Consider splints with ice and Salon Pas patches

## 2014-09-27 NOTE — Progress Notes (Signed)
Pre visit review using our clinic review tool, if applicable. No additional management support is needed unless otherwise documented below in the visit note. 

## 2014-10-10 ENCOUNTER — Encounter: Payer: Self-pay | Admitting: Family Medicine

## 2014-10-10 DIAGNOSIS — Z9889 Other specified postprocedural states: Secondary | ICD-10-CM | POA: Insufficient documentation

## 2014-10-10 DIAGNOSIS — E669 Obesity, unspecified: Secondary | ICD-10-CM

## 2014-10-10 DIAGNOSIS — R112 Nausea with vomiting, unspecified: Secondary | ICD-10-CM | POA: Insufficient documentation

## 2014-10-10 DIAGNOSIS — E782 Mixed hyperlipidemia: Secondary | ICD-10-CM

## 2014-10-10 DIAGNOSIS — B079 Viral wart, unspecified: Secondary | ICD-10-CM

## 2014-10-10 HISTORY — DX: Mixed hyperlipidemia: E78.2

## 2014-10-10 HISTORY — DX: Obesity, unspecified: E66.9

## 2014-10-10 HISTORY — DX: Viral wart, unspecified: B07.9

## 2014-10-10 NOTE — Assessment & Plan Note (Signed)
Encouraged DASH diet, decrease po intake and increase exercise as tolerated. Needs 7-8 hours of sleep nightly. Avoid trans fats, eat small, frequent meals every 4-5 hours with lean proteins, complex carbs and healthy fats. Minimize simple carbs 

## 2014-10-10 NOTE — Assessment & Plan Note (Signed)
Patient will try home topical treatments and start Folic Acid daily

## 2014-10-10 NOTE — Assessment & Plan Note (Signed)
Patient encouraged to maintain heart healthy diet, regular exercise, adequate sleep. Consider daily probiotics. Take medications as prescribed. Sees Dr Verlee Monte for GYN

## 2014-10-10 NOTE — Assessment & Plan Note (Signed)
Follows with Dr Adele Dan of Sports Med

## 2014-10-10 NOTE — Assessment & Plan Note (Signed)
Encouraged heart healthy diet, increase exercise, avoid trans fats, consider a krill oil cap daily 

## 2014-10-25 ENCOUNTER — Ambulatory Visit: Payer: BLUE CROSS/BLUE SHIELD | Admitting: Family Medicine

## 2014-10-28 ENCOUNTER — Telehealth: Payer: Self-pay | Admitting: Family Medicine

## 2014-10-28 NOTE — Telephone Encounter (Signed)
Pt was no show 10/25/14 5:30pm, 30 min office visit for 4 week f/u and wart removal, left msg for pt to call and reschedule, charge no show?

## 2014-10-29 ENCOUNTER — Encounter: Payer: Self-pay | Admitting: Family Medicine

## 2014-10-30 NOTE — Telephone Encounter (Signed)
Yes charge no show 

## 2014-11-04 ENCOUNTER — Ambulatory Visit: Payer: Self-pay | Admitting: Family Medicine

## 2014-12-12 ENCOUNTER — Ambulatory Visit (INDEPENDENT_AMBULATORY_CARE_PROVIDER_SITE_OTHER): Payer: BLUE CROSS/BLUE SHIELD | Admitting: Medical

## 2014-12-12 ENCOUNTER — Encounter: Payer: Self-pay | Admitting: Medical

## 2014-12-12 VITALS — BP 118/80 | HR 84 | Temp 98.7°F | Ht 62.0 in | Wt 194.6 lb

## 2014-12-12 DIAGNOSIS — J0100 Acute maxillary sinusitis, unspecified: Secondary | ICD-10-CM | POA: Diagnosis not present

## 2014-12-12 DIAGNOSIS — J3089 Other allergic rhinitis: Secondary | ICD-10-CM | POA: Diagnosis not present

## 2014-12-12 MED ORDER — HYDROCODONE-HOMATROPINE 5-1.5 MG/5ML PO SYRP
5.0000 mL | ORAL_SOLUTION | Freq: Three times a day (TID) | ORAL | Status: DC | PRN
Start: 2014-12-12 — End: 2015-05-03

## 2014-12-12 MED ORDER — METHYLPREDNISOLONE ACETATE 40 MG/ML IJ SUSP
40.0000 mg | Freq: Once | INTRAMUSCULAR | Status: AC
  Administered 2014-12-12: 40 mg via INTRAMUSCULAR

## 2014-12-12 MED ORDER — AZITHROMYCIN 250 MG PO TABS: ORAL_TABLET | ORAL | Status: DC

## 2014-12-12 NOTE — Progress Notes (Signed)
Pre visit review using our clinic review tool, if applicable. No additional management support is needed unless otherwise documented below in the visit note. 

## 2014-12-12 NOTE — Patient Instructions (Addendum)
I think you have suffered from allergic rhinitis(since 3rd visit within 3 weeks will treat on more aggressive side). We gave depomedrol 40 mg im today. Continue the flonase.  For possible sinus infection rx azithromycin.  For your cough hycodan rx.  Follow up in 10-14 days or as needed

## 2014-12-12 NOTE — Progress Notes (Signed)
Subjective:    Patient ID: Tammy Brennan, female    DOB: 1975/07/20, 39 y.o.   MRN: [id number]  HPI  Cough and nasal congestion for 2 wks. Pt went to UC and they amoxicllin and steroid nasal spray. Did not help much.  Then 2nd UC evaluation told to try sudafed. But did not help.  Pt states sinus pressure and feels pnd. States feel a lot of nasal congestion and feels drainagedown back of her throat.  Pt denies hx of allergies this time of year. Pt is not diabetic.  LMP- Pt is on ocp.  Pt is not diabetic.     Review of Systems  Constitutional: Negative for fever, chills and fatigue.  HENT: Positive for congestion, postnasal drip and sinus pressure. Negative for ear pain, rhinorrhea and sore throat.   Respiratory: Positive for cough.   Cardiovascular: Negative for chest pain and palpitations.  Gastrointestinal: Negative for abdominal pain.  Musculoskeletal: Negative for back pain.  Neurological: Negative for dizziness and headaches.  Hematological: Negative for adenopathy. Does not bruise/bleed easily.   Past Medical History  Diagnosis Date  . Carpal tunnel syndrome 06/20/2014  . Leg pain, right 06/20/2014  . Preventative health care 09/27/2014    Sees Dr Arrie Aran for GYN  . Hyperlipidemia, mixed 10/10/2014  . Wart 10/10/2014  . Obesity 10/10/2014    Social History   Social History  . Marital Status: Married    Spouse Name: N/A  . Number of Children: N/A  . Years of Education: N/A   Occupational History  . Not on file.   Social History Main Topics  . Smoking status: Never Smoker   . Smokeless tobacco: Never Used  . Alcohol Use: No  . Drug Use: No  . Sexual Activity: Yes     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, no dietary restrictions, 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[employer]   Other Topics Concern  . Not on file   Social History Narrative    No past surgical history on file.  Family History  Problem Relation Age of Onset  . Hypertension Mother   .  Hypertension Father   . Cancer Father     lung, previous light smoker  . Alcohol abuse Maternal Grandfather   . Heart disease Maternal Grandfather     MI  . Dementia Paternal Grandfather   . Hypertension Sister     No Known Allergies  Current Outpatient Prescriptions on File Prior to Visit  Medication Sig Dispense Refill  . norethindrone-ethinyl estradiol (MICROGESTIN,JUNEL,LOESTRIN) 1-20 MG-MCG tablet Take 1 tablet by mouth daily.     No current facility-administered medications on file prior to visit.    BP 118/80 mmHg  Pulse 84  Temp(Src) 98.7 F (37.1 C) (Oral)  Ht  (1.575 m)  Wt 194 lb 9.6 oz (88.27 kg)  BMI 35.58 kg/m2  SpO2 98%       Objective:   Physical Exam  General  Mental Status - Alert. General Appearance - Well groomed. Not in acute distress.  Skin Rashes- No Rashes.  HEENT Head- Normal. Ear Auditory Canal - Left- Normal. Right - Normal.Tympanic Membrane- Left- Normal. Right- Normal. Eye Sclera/Conjunctiva- Left- Normal. Right- Normal. Nose & Sinuses Nasal Mucosa- Left-  Boggy and Congested. Right-  Boggy and  Congested.Bilateral maxillary and frontal sinus pressure. Mouth & Throat Lips: Upper Lip- Normal: no dryness, cracking, pallor, cyanosis, or vesicular eruption. Lower Lip-Normal: no dryness, cracking, pallor, cyanosis or vesicular eruption. Buccal Mucosa- Bilateral- No Aphthous ulcers.  Oropharynx- No Discharge or Erythema. Tonsils: Characteristics- Bilateral- No Erythema or Congestion. Size/Enlargement- Bilateral- No enlargement. Discharge- bilateral-None.  Neck Neck- Supple. No Masses.   Chest and Lung Exam Auscultation: Breath Sounds:-Clear even and unlabored.  Cardiovascular Auscultation:Rythm- Regular, rate and rhythm. Murmurs & Other Heart Sounds:Ausculatation of the heart reveal- No Murmurs.  Lymphatic Head & Neck General Head & Neck Lymphatics: Bilateral: Description- No Localized lymphadenopathy.         Assessment & Plan:  I think you have suffered from allergic rhinitis(since 3rd visit within 3 weeks will treat on more aggressive side). We gave depomedrol 40 mg im today. Continue the flonase.  For possible sinus infection rx azithromycin.  For your cough hycodan rx.  Follow up in 10-14 days or as needed

## 2015-04-27 ENCOUNTER — Telehealth: Payer: Self-pay | Admitting: Family Medicine

## 2015-04-27 ENCOUNTER — Ambulatory Visit: Payer: BLUE CROSS/BLUE SHIELD | Admitting: Medical

## 2015-04-27 NOTE — Telephone Encounter (Signed)
Patient Name: Tammy Brennan DOB: May 07, 1975 Initial Comment Caller states she was having chest pains on Tuesday- she's not having any now. Nurse Assessment Nurse: Charna Elizabeth, RN, Cathy Date/Time (Eastern Time): 04/27/2015 1:20:52 PM Confirm and document reason for call. If symptomatic, describe symptoms. You must click the next button to save text entered. ---[name] states that she had chest pain about 2 days ago that lasted for about 3.5 hours (rated as a 7/8 on the 1 to 10 scale). No breathing difficulty. No chest pain today. No injury in the past 3 days. No fever. Has the patient traveled out of the country within the last 30 days? ---No Does the patient have any new or worsening symptoms? ---Yes Will a triage be completed? ---Yes Related visit to physician within the last 2 weeks? ---No Does the PT have any chronic conditions? (i.e. diabetes, asthma, etc.) ---No Is the patient pregnant or possibly pregnant? (Ask all females between the ages of 49-55) ---No Is this a behavioral health or substance abuse call? ---No Guidelines Guideline Title Affirmed Question Affirmed Notes Chest Pain Chest pain lasts > 5 minutes (Exceptions: chest pain occurring > 3 days ago and now asymptomatic; same as previously diagnosed heartburn and has accompanying sour taste in mouth) Final Disposition User Go to ED Now (or PCP triage) Charna Elizabeth, RN, [place] requests to cancel the 5:45pm appointment with Dr. Alvira Monday today. She will call back to reschedule as needed. Referrals [facility] - ED Disagree/Comply: Comply

## 2015-04-27 NOTE — Telephone Encounter (Signed)
Spoke w/ pt and verified triage info from [facility]. She is currently chest pain free and has had no other symptoms or episodes of chest pain other than what is detailed below.  She has 5:45pm appt w/ Ramon Dredge today, but reported cannot make appt. I asked pt repeatedly if there was any time she could be seen in the office today, she said no. I reiterated to pt that based on her symptoms, she should see a provider today per [facility] recommendation. She then stated "I guess I could go to the ED after work today." Pt states she gets off work at 5:30pm and would proceed to ED then. I them reminded pt of 5:45 appt in our office, which she said she could not make because she did not have child care. I then offered her 6:00 appt w/ Ramon Dredge, which she then stated she might be able to do. Pt stated she would call her husband to see if he would be home in time for her to make 6:00 appt and then call the office back. For now, pt rescheduled for 6pm. Will await callback from pt to confirm. Will f/u w/ pt if no callback within the hour.

## 2015-04-27 NOTE — Telephone Encounter (Signed)
Called pt to follow up, no answer and no other numbers on file. Pt's appt was mistakenly rescheduled for 05/04/15 at 6pm instead of today at 6pm. However, there is a 6:15 slot that can unblocked per provider today. Informed pt via VM of 6:15 appt available w/ Ramon Dredge today. LM for pt to call back to schedule appt.

## 2015-04-28 ENCOUNTER — Ambulatory Visit: Payer: BLUE CROSS/BLUE SHIELD | Admitting: Family Medicine

## 2015-05-03 ENCOUNTER — Encounter: Payer: Self-pay | Admitting: Medical

## 2015-05-03 ENCOUNTER — Ambulatory Visit (INDEPENDENT_AMBULATORY_CARE_PROVIDER_SITE_OTHER): Payer: BLUE CROSS/BLUE SHIELD | Admitting: Medical

## 2015-05-03 ENCOUNTER — Telehealth: Payer: Self-pay | Admitting: Internal Medicine

## 2015-05-03 VITALS — BP 139/87 | HR 64 | Temp 98.1°F | Ht 62.0 in | Wt 193.2 lb

## 2015-05-03 DIAGNOSIS — R002 Palpitations: Secondary | ICD-10-CM

## 2015-05-03 DIAGNOSIS — R0789 Other chest pain: Secondary | ICD-10-CM

## 2015-05-03 DIAGNOSIS — R030 Elevated blood-pressure reading, without diagnosis of hypertension: Secondary | ICD-10-CM | POA: Diagnosis not present

## 2015-05-03 DIAGNOSIS — M5441 Lumbago with sciatica, right side: Secondary | ICD-10-CM | POA: Diagnosis not present

## 2015-05-03 DIAGNOSIS — IMO0001 Reserved for inherently not codable concepts without codable children: Secondary | ICD-10-CM

## 2015-05-03 LAB — TROPONIN I: Troponin I: <0.01 ng/mL (ref <=0.05)

## 2015-05-03 MED ORDER — CYCLOBENZAPRINE HCL 10 MG PO TABS: 10.0000 mg | ORAL_TABLET | Freq: Every day | ORAL | Status: DC

## 2015-05-03 NOTE — Patient Instructions (Addendum)
I do think flushing and redness of face is side effect prednisone. But would continue it based on your history of back pain.  For back pain will add flexeril. Refer to neurosurgeon.  Would get otc cuff and check bp every other day. If bp over 140/90 on average would recommend medication.  ekg today to get baseline. No pain or palpitation today. If any pain or palpatitions occur again then ED evaluation. Avoid caffeine beverages.  Follow up in 2 wks or as as needed(bring bp readings in on follow up)

## 2015-05-03 NOTE — Telephone Encounter (Signed)
Lab called 05/03/15 [phone]) with troponin results.  Troponin <0.01.  Pt notified of results.  She is not having any chest pain or any acute symptoms.  Discussed visit and need for f/u.  Is aware if any change in symptoms or problems, needs to be reevaluated.

## 2015-05-03 NOTE — Progress Notes (Signed)
Subjective:    Patient ID: Tammy Brennan, female    DOB: 1975/03/25, 40 y.o.   MRN: [id number]  HPI  Pt in wok up this am and felt flushed. Faced was red. Pt is on steroid for pain in her back. Pt never had red face with steroids. Pt prior sports medicine MD called steroid. But he is not on insurance plan.  Pt states she has back pain for months. Pt mri showed.   1. L5-S1 grade 1 spondylolisthesis and chronic spondylolysis with disc and posterior element degeneration. Severe moderate to severe multifactorial L5 foraminal stenosis appears worse on the left. Up to mild right lateral recess stenosis. 2. Negative lumbar spine elsewhere aside from facet hypertrophy.  Pt stats pain is severe. She states pain medicine never helped. Much at all. Some radiating pain to legs but no numbness, no leg weakness, no incontinence of foot dop.    Pt blood pressure was 150/100 at work. Then later at lunch break 142/94. Pt has not been on any blood pressure medicine.  Pt states feels fine now.   Pt also mentions on Tuesday she had pain for 3 hours 5 pm-8 pm. No jaw pain, no arm pain, no nausea, no vomiting. Then on 2 wks ago felt like had palpations briefly at night.      Review of Systems  Constitutional: Negative for fever, chills and fatigue.  Respiratory: Negative for choking, shortness of breath and wheezing.   Cardiovascular: Negative for chest pain and palpitations.       None now but see hop  Gastrointestinal: Negative for abdominal pain.  Musculoskeletal: Positive for back pain.  Neurological: Negative for dizziness and headaches.       Some radiating rt rt leg. Chronic. But no incontience. No foot drop or weakness.  Hematological: Negative for adenopathy. Does not bruise/bleed easily.  Psychiatric/Behavioral: Negative for behavioral problems and confusion.       Objective:   Physical Exam  General Mental Status- Alert. General Appearance- Not in acute distress.    Skin General: Color- Normal Color. Moisture- Normal Moisture.  Neck Carotid Arteries- Normal color. Moisture- Normal Moisture. No carotid bruits. No JVD.  Chest and Lung Exam Auscultation: Breath Sounds:-Normal.  Cardiovascular Auscultation:Rythm- Regular. Murmurs & Other Heart Sounds:Auscultation of the heart reveals- No Murmurs.  Abdomen Inspection:-Inspeection Normal. Palpation/Percussion:Note:No mass. Palpation and Percussion of the abdomen reveal- Non Tender, Non Distended + BS, no rebound or guarding.    Neurologic Cranial Nerve exam:- CN III-XII intact(No nystagmus), symmetric smile. Strength:- 5/5 equal and symmetric strength both upper and lower extremities.     Back Mid lumbar spine tenderness to palpation. Pain on straight leg lift. Pain on lateral movements and flexion/extension of the spine.  Lower ext neurologic  L5-S1 sensation intact bilaterally. Normal patellar reflexes bilaterally. No foot drop bilaterally.  Lower ext- negative homans signs.      Assessment & Plan:  Did ekg. t waves v1 and v2 look negative.( I check troponin later after hours and was negative. I had plans to call pt but appears Doctor on call got the results and notifie pt.)   I do think flushing and redness of face is  side effect prednisone. But would continue it based on your history of back pain.  For back pain will add flexeril. Refer to neurosurgeon.  Would get otc cuff and check bp every other day. If bp over 140/90 on average would recommend medication.  ekg today to get baseline. No pain or  palpitation today. If any pain or palpatitions occur again then ED evaluation. Avoid caffeine beverages.  Follow up in 2 wks or as needed

## 2015-05-03 NOTE — Progress Notes (Signed)
Pre visit review using our clinic review tool, if applicable. No additional management support is needed unless otherwise documented below in the visit note. 

## 2015-05-04 ENCOUNTER — Encounter: Payer: Self-pay | Admitting: Family Medicine

## 2015-05-04 ENCOUNTER — Ambulatory Visit: Payer: BLUE CROSS/BLUE SHIELD | Admitting: Medical

## 2015-05-05 ENCOUNTER — Ambulatory Visit: Payer: BLUE CROSS/BLUE SHIELD | Admitting: Family Medicine

## 2015-05-05 ENCOUNTER — Ambulatory Visit: Payer: Self-pay | Admitting: Family Medicine

## 2015-05-05 NOTE — Progress Notes (Signed)
Quick Note:  Pt has seen results on MyChart and message also sent for patient to call back if any questions. ______ 

## 2015-05-08 NOTE — Telephone Encounter (Signed)
Please advise if you would be able to remove skin tags on Tammy Brennan?

## 2015-05-09 NOTE — Telephone Encounter (Signed)
Where are her skin tags. How many does she have and where are they?(won't do on face). I can't do numerous skin tags and visit unless I have 45 minute slot. So if that is not possible then I can refer to dermatologist.

## 2015-05-15 ENCOUNTER — Ambulatory Visit (INDEPENDENT_AMBULATORY_CARE_PROVIDER_SITE_OTHER): Payer: BLUE CROSS/BLUE SHIELD | Admitting: Medical

## 2015-05-15 ENCOUNTER — Encounter: Payer: Self-pay | Admitting: Medical

## 2015-05-15 VITALS — BP 130/80 | HR 77 | Temp 98.1°F | Ht 62.0 in | Wt 194.2 lb

## 2015-05-15 DIAGNOSIS — R002 Palpitations: Secondary | ICD-10-CM | POA: Diagnosis not present

## 2015-05-15 DIAGNOSIS — L918 Other hypertrophic disorders of the skin: Secondary | ICD-10-CM | POA: Diagnosis not present

## 2015-05-15 DIAGNOSIS — R0789 Other chest pain: Secondary | ICD-10-CM | POA: Diagnosis not present

## 2015-05-15 NOTE — Progress Notes (Signed)
Pre visit review using our clinic review tool, if applicable. No additional management support is needed unless otherwise documented below in the visit note. 

## 2015-05-15 NOTE — Progress Notes (Signed)
Subjective:    Patient ID: Tammy Brennan, female    DOB: 17-Feb-1976, 40 y.o.   MRN: [id number]  HPI   Pt states she has not had any recent chest pain(none since I last she mentioned on prior visit). She did have slight palpitation toward end of last week(only second or two)  She is here for skin tag removal. She has about 3 present. One left side of neck base of trap. One on rt side of her neck. One small skin tag between her upper breast/upper chest.   Pt bp was 144/90 one time. Since stopping her steroids her bp has been better. All below 140/90. Today bp is good.   Review of Systems  Constitutional: Negative for chills and fatigue.  Respiratory: Negative for cough and chest tightness.   Cardiovascular: Negative for chest pain and palpitations.  Gastrointestinal: Negative for abdominal pain.  Musculoskeletal: Negative for back pain.  Skin:       Skin tags.  Neurological: Negative for dizziness and light-headedness.  Hematological: Negative for adenopathy. Does not bruise/bleed easily.  Psychiatric/Behavioral: Negative for behavioral problems and confusion.   Past Medical History  Diagnosis Date  . Carpal tunnel syndrome 06/20/2014  . Leg pain, right 06/20/2014  . Preventative health care 09/27/2014    Sees Dr Arrie Aran for GYN  . Hyperlipidemia, mixed 10/10/2014  . Wart 10/10/2014  . Obesity 10/10/2014    Social History   Social History  . Marital Status: Married    Spouse Name: N/A  . Number of Children: N/A  . Years of Education: N/A   Occupational History  . Not on file.   Social History Main Topics  . Smoking status: Never Smoker   . Smokeless tobacco: Never Used  . Alcohol Use: No  . Drug Use: No  . 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 Not on file   Social History Narrative    No past surgical history on file.  Family History  Problem  Relation Age of Onset  . Hypertension Mother   . Hypertension Father   . Cancer Father     lung, previous light smoker  . Alcohol abuse Maternal Grandfather   . Heart disease Maternal Grandfather     MI  . Dementia Paternal Grandfather   . Hypertension Sister     No Known Allergies  Current Outpatient Prescriptions on File Prior to Visit  Medication Sig Dispense Refill  . cyclobenzaprine (FLEXERIL) 10 MG tablet Take 1 tablet (10 mg total) by mouth at bedtime. 10 tablet 0  . norethindrone-ethinyl estradiol (MICROGESTIN,JUNEL,LOESTRIN) 1-20 MG-MCG tablet Take 1 tablet by mouth daily.     No current facility-administered medications on file prior to visit.    BP 130/80 mmHg  Pulse 77  Temp(Src) 98.1 F (36.7 C) (Oral)  Ht  (1.575 m)  Wt 194 lb 3.2 oz (88.089 kg)  BMI 35.51 kg/m2  SpO2 98%       Objective:   Physical Exam  General- No acute distress. Pleasant patient. Neck- Full range of motion, no jvd Lungs- Clear, even and unlabored. Heart- regular rate and rhythm. Neurologic- CNII- XII grossly intact.  Skin tabs- very small pedunculated appearance. One at base of her left anterior trapezius. One rt side of neck and one mid sternum between the breasts.      Assessment & Plan:  No palpitations  presently and no chest pain. Reminder if these are occuring frequently then let me know and will refer to cardiologist.  Consent form signed. Procedure done. No complication. If any signs or symptoms infection, persisting bleeding let us know. Follow up as needed.  Bp is better today. Will continue to follow. May have been up only while on prednisone.  Procedure done sterile manner. Cleaned each tag with sterile saline and betadine. Use anesthetic spray. Use scissors to cut of tag. Bleeding so minimal did not need silver nitrate stick. After care measures explained.

## 2015-05-15 NOTE — Patient Instructions (Addendum)
No palpitations presently and no chest pain. Reminder if these are occuring frequently then let me know and will refer to cardiologist.  Consent form signed. Procedure done. No complication. If any signs or symptoms infection, persisting bleeding let us know. Follow up as needed.  Bp is better today. Will continue to follow. May have been up only while on prednisone.  Follow up in 3 wks or as needed.

## 2015-06-28 DIAGNOSIS — Z6835 Body mass index (BMI) 35.0-35.9, adult: Secondary | ICD-10-CM | POA: Diagnosis not present

## 2015-06-28 DIAGNOSIS — M4316 Spondylolisthesis, lumbar region: Secondary | ICD-10-CM | POA: Diagnosis not present

## 2015-06-28 DIAGNOSIS — M5416 Radiculopathy, lumbar region: Secondary | ICD-10-CM | POA: Diagnosis not present

## 2015-06-28 DIAGNOSIS — M5136 Other intervertebral disc degeneration, lumbar region: Secondary | ICD-10-CM | POA: Diagnosis not present

## 2015-10-05 ENCOUNTER — Telehealth: Payer: Self-pay | Admitting: Family Medicine

## 2015-10-05 ENCOUNTER — Other Ambulatory Visit: Payer: Self-pay | Admitting: Medical

## 2015-10-05 NOTE — Telephone Encounter (Signed)
Need appointment. Agree with advise.

## 2015-10-05 NOTE — Telephone Encounter (Signed)
Relation to GN:OIBB Call back [phone] Pharmacy: [id number] - [place], [place] - [address] AT [address] OF [address] & [address] [phone] (Phone) [phone] (Fax)     Reason for call:  Patient states she experiencing dry cough and requesting a Zpack. Advised patient to schedule appointment patient wanted to inform covering PCP regarding symptoms.

## 2015-10-06 NOTE — Telephone Encounter (Signed)
I thought I sent message to Francesco Runner, or Zella Ball to advise pt she needs to be seen I order to assess if antibotic needed. Advise the same please

## 2015-10-06 NOTE — Telephone Encounter (Signed)
Called patient to inform her we received refill request for Z-Pak. Asked what it was she needed medication for. Left message for return call.

## 2015-10-06 NOTE — Telephone Encounter (Signed)
Called left msg. To call back 

## 2015-10-09 NOTE — Telephone Encounter (Signed)
Left message on patient answering machine that apointment needed for consideration of Z-Pak.

## 2015-10-09 NOTE — Telephone Encounter (Signed)
LMOM with contact name and number for return call RE: request for ABX that will need an OV prior to being px per  provider instructions [Last seen Ramon Dredge: 05/15/15 without mention of cough and/or cold symptoms and patient Last seen by PCP, Dr. Abner Greenspan 09/24/2014]; instructed to call our office and schedule appointment with a provider that has an available appt/SLS 07/31

## 2015-11-14 DIAGNOSIS — L821 Other seborrheic keratosis: Secondary | ICD-10-CM | POA: Diagnosis not present

## 2015-11-14 DIAGNOSIS — B078 Other viral warts: Secondary | ICD-10-CM | POA: Diagnosis not present

## 2015-11-30 DIAGNOSIS — M5136 Other intervertebral disc degeneration, lumbar region: Secondary | ICD-10-CM | POA: Diagnosis not present

## 2015-11-30 DIAGNOSIS — M4316 Spondylolisthesis, lumbar region: Secondary | ICD-10-CM | POA: Diagnosis not present

## 2015-11-30 DIAGNOSIS — M5416 Radiculopathy, lumbar region: Secondary | ICD-10-CM | POA: Diagnosis not present

## 2015-11-30 DIAGNOSIS — Z6835 Body mass index (BMI) 35.0-35.9, adult: Secondary | ICD-10-CM | POA: Diagnosis not present

## 2015-12-29 DIAGNOSIS — R635 Abnormal weight gain: Secondary | ICD-10-CM | POA: Diagnosis not present

## 2015-12-29 DIAGNOSIS — R5383 Other fatigue: Secondary | ICD-10-CM | POA: Diagnosis not present

## 2015-12-29 DIAGNOSIS — Z131 Encounter for screening for diabetes mellitus: Secondary | ICD-10-CM | POA: Diagnosis not present

## 2015-12-29 DIAGNOSIS — Z5181 Encounter for therapeutic drug level monitoring: Secondary | ICD-10-CM | POA: Diagnosis not present

## 2015-12-29 DIAGNOSIS — E669 Obesity, unspecified: Secondary | ICD-10-CM | POA: Diagnosis not present

## 2016-01-26 DIAGNOSIS — I517 Cardiomegaly: Secondary | ICD-10-CM | POA: Diagnosis not present

## 2016-04-24 DIAGNOSIS — Z6835 Body mass index (BMI) 35.0-35.9, adult: Secondary | ICD-10-CM | POA: Diagnosis not present

## 2016-04-24 DIAGNOSIS — M5416 Radiculopathy, lumbar region: Secondary | ICD-10-CM | POA: Diagnosis not present

## 2016-04-24 DIAGNOSIS — M5136 Other intervertebral disc degeneration, lumbar region: Secondary | ICD-10-CM | POA: Diagnosis not present

## 2016-04-24 DIAGNOSIS — M4316 Spondylolisthesis, lumbar region: Secondary | ICD-10-CM | POA: Diagnosis not present

## 2016-05-01 DIAGNOSIS — Z01419 Encounter for gynecological examination (general) (routine) without abnormal findings: Secondary | ICD-10-CM | POA: Diagnosis not present

## 2016-05-01 DIAGNOSIS — Z6830 Body mass index (BMI) 30.0-30.9, adult: Secondary | ICD-10-CM | POA: Diagnosis not present

## 2016-05-01 DIAGNOSIS — J029 Acute pharyngitis, unspecified: Secondary | ICD-10-CM | POA: Diagnosis not present

## 2016-05-01 DIAGNOSIS — Z1231 Encounter for screening mammogram for malignant neoplasm of breast: Secondary | ICD-10-CM | POA: Diagnosis not present

## 2016-05-18 DIAGNOSIS — J020 Streptococcal pharyngitis: Secondary | ICD-10-CM | POA: Diagnosis not present

## 2016-06-20 ENCOUNTER — Telehealth: Payer: Self-pay | Admitting: Family Medicine

## 2016-06-20 NOTE — Telephone Encounter (Signed)
Patient Name: Tammy Brennan  DOB: Jul 24, 1975    Initial Comment Caller states that she is dizzy, light headed,nauseous, and shaky. BP is 138/100   Nurse Assessment  Nurse: Stefano Gaul, RN, Dwana Curd Date/Time (Eastern Time): 06/20/2016 1:19:34 PM  Confirm and document reason for call. If symptomatic, describe symptoms. ---Caller states her BP is 138/100. Earlier she was lightheaded, nauseated, dizzy around 12:15 pm. No dizziness or nausea now. she felt better after she ate.  Does the patient have any new or worsening symptoms? ---Yes  Will a triage be completed? ---Yes  Related visit to physician within the last 2 weeks? ---No  Does the PT have any chronic conditions? (i.e. diabetes, asthma, etc.) ---No  Is the patient pregnant or possibly pregnant? (Ask all females between the ages of 44-55) ---No  Is this a behavioral health or substance abuse call? ---No     Guidelines    Guideline Title Affirmed Question Affirmed Notes  High Blood Pressure Systolic BP >= 160 OR Diastolic >= 100    Final Disposition User   See PCP When Office is Open (within 3 days) Stefano Gaul, RN, Dwana Curd    Comments  appt scheduled for 06/21/2016 at 1:15 pm with Dr. Arva Chafe   Disagree/Comply: Comply

## 2016-06-21 ENCOUNTER — Encounter: Payer: Self-pay | Admitting: Family Medicine

## 2016-06-21 ENCOUNTER — Ambulatory Visit (INDEPENDENT_AMBULATORY_CARE_PROVIDER_SITE_OTHER): Payer: BLUE CROSS/BLUE SHIELD | Admitting: Family Medicine

## 2016-06-21 VITALS — BP 120/90 | HR 98 | Temp 97.8°F | Ht 62.0 in | Wt 167.2 lb

## 2016-06-21 DIAGNOSIS — R1013 Epigastric pain: Secondary | ICD-10-CM

## 2016-06-21 DIAGNOSIS — R030 Elevated blood-pressure reading, without diagnosis of hypertension: Secondary | ICD-10-CM

## 2016-06-21 DIAGNOSIS — R42 Dizziness and giddiness: Secondary | ICD-10-CM

## 2016-06-21 LAB — COMPREHENSIVE METABOLIC PANEL
ALBUMIN: 3.9 g/dL (ref 3.5–5.2)
ALK PHOS: 55 U/L (ref 39–117)
ALT: 17 U/L (ref 0–35)
AST: 18 U/L (ref 0–37)
BILIRUBIN TOTAL: 0.4 mg/dL (ref 0.2–1.2)
BUN: 16 mg/dL (ref 6–23)
CALCIUM: 9.6 mg/dL (ref 8.4–10.5)
CO2: 29 mEq/L (ref 19–32)
Chloride: 102 mEq/L (ref 96–112)
Creatinine, Ser: 0.69 mg/dL (ref 0.40–1.20)
GFR: 98.92 mL/min (ref >60.00)
GLUCOSE: 85 mg/dL (ref 70–99)
POTASSIUM: 4.4 meq/L (ref 3.5–5.1)
Sodium: 137 mEq/L (ref 135–145)
TOTAL PROTEIN: 7.1 g/dL (ref 6.0–8.3)

## 2016-06-21 LAB — CBC
HCT: 40 % (ref 36.0–46.0)
HEMOGLOBIN: 13.2 g/dL (ref 12.0–15.0)
MCHC: 33.1 g/dL (ref 30.0–36.0)
MCV: 90.5 fl (ref 78.0–100.0)
PLATELETS: 449 10*3/uL — AB (ref 150.0–400.0)
RBC: 4.42 Mil/uL (ref 3.87–5.11)
RDW: 13 % (ref 11.5–15.5)
WBC: 8.7 10*3/uL (ref 4.0–10.5)

## 2016-06-21 LAB — TSH: TSH: 2.38 u[IU]/mL (ref 0.35–4.50)

## 2016-06-21 LAB — HEMOGLOBIN A1C: HEMOGLOBIN A1C: 5.8 % (ref 4.6–6.5)

## 2016-06-21 NOTE — Progress Notes (Signed)
Chief Complaint  Patient presents with  . Lightheadness    and nauseated,BP elevated-happen on yest am    Subjective Tammy Brennan is a 41 y.o. female who presents for Elevated BP, lightheadedness and some nausea.  Yesterday, she was at work and started having some dizziness (described as being on a ship), nausea, and shaking. She went to the nurse and found that her BP was elevated. Symptoms resolved after eating. It returned just before lunch and resolved yet again after eating. She does admit to some R ear fullness. The dizziness had lasted around 1 hr before resolving. She does not have it currently. She has intentionally lost 30 lbs since October. No recent illness, fevers, injury, numbness, tingling, vision changes, or weakness.  Also brought up 2 days of epigastric abd pain. No N/V/D/C, some crampy pain. No nighttime awakenings, bleeding, or being affected by food.   Past Medical History:  Diagnosis Date  . Carpal tunnel syndrome 06/20/2014  . Hyperlipidemia, mixed 10/10/2014  . Leg pain, right 06/20/2014  . Obesity 10/10/2014  . Preventative health care 09/27/2014   Sees Dr Arrie Aran for GYN  . Wart 10/10/2014   Family History  Problem Relation Age of Onset  . Hypertension Mother   . Hypertension Father   . Cancer Father     lung, previous light smoker  . Alcohol abuse Maternal Grandfather   . Heart disease Maternal Grandfather     MI  . Dementia Paternal Grandfather   . Hypertension Sister      Medications Current Outpatient Prescriptions on File Prior to Visit  Medication Sig Dispense Refill  . norethindrone-ethinyl estradiol (MICROGESTIN,JUNEL,LOESTRIN) 1-20 MG-MCG tablet Take 1 tablet by mouth daily.     Allergies No Known Allergies  Review of Systems Cardiovascular: no chest pain Respiratory:  no shortness of breath  Exam BP 120/90 (BP Location: Left Arm, Patient Position: Sitting, Cuff Size: Normal)   Pulse 98   Temp 97.8 F (36.6 C) (Oral)   Ht 5'  2" (1.575 m)   Wt 167 lb 3.2 oz (75.8 kg)   SpO2 99%   BMI 30.58 kg/m  General:  well developed, well nourished, in no apparent distress Skin:  warm, no pallor or diaphoresis Eyes:  pupils equal and round, sclera anicteric without injection Heart :RRR, no murmurs, no bruits, no LE edema Lungs:  clear to auscultation, no accessory muscle use Neuro: 2/4 patellar reflex b/l, 1/4 calcaneal reflex b/l, 2/4 biceps reflex b/l, no clonus, neg Intel b/l Msk: 5/5 strength throughout, gait is normal Psych: well oriented with normal range of affect and appropriate judgment/insight  Dizziness - Plan: TSH, Hemoglobin A1c, Comprehensive metabolic panel, CBC  Elevated blood pressure reading  Epigastric abdominal pain  Orders as above. Sounds like related to hypoglycemia. Make sure to eat and drink enough.  No red flag signs. Discussed symptoms diary and PPI trial for epigastric abd pain. Keep home BP and return in 2 weeks to see if we need to discuss starting BP medicine. F/u in 2 weeks for nurse BP visit. The patient voiced understanding and agreement to the plan.  Jilda Roche Lebec, DO 06/21/16  2:49 PM

## 2016-06-21 NOTE — Patient Instructions (Signed)
Stay well hydrated.  Keep a symptom diary for your abdominal pain. Can try Prilosec (omeprazole) to see if it helps.  Around 3 times per week, check your blood pressure 4 times per day. Twice in the morning and twice in the evening. The readings should be at least one minute apart. Write down these values and bring them to your next nurse visit/appointment.  When you check your BP, make sure you have been doing something calm/relaxing 5 minutes prior to checking. Both feet should be flat on the floor and you should be sitting. Use your left arm and make sure it is in a relaxed position (on a table), and that the cuff is at the approximate level/height of your heart.   Claritin (loratadine), Allegra (fexofenadine), Zyrtec (cetirizine); these are listed in order from weakest to strongest. Generic, and therefore cheaper, options are in the parentheses.   Flonase (fluticasone); nasal spray that is over the counter. 2 sprays each nostril, once daily. Aim towards the same side eye when you spray.  There are available OTC, and the generic versions, which may be cheaper, are in parentheses. Show this to a pharmacist if you have trouble finding any of these items.

## 2016-06-21 NOTE — Progress Notes (Signed)
Pre visit review using our clinic review tool, if applicable. No additional management support is needed unless otherwise documented below in the visit note. 

## 2016-06-24 ENCOUNTER — Encounter: Payer: Self-pay | Admitting: Family Medicine

## 2016-06-25 ENCOUNTER — Encounter: Payer: Self-pay | Admitting: Family Medicine

## 2016-06-25 ENCOUNTER — Ambulatory Visit: Payer: BLUE CROSS/BLUE SHIELD | Admitting: Medical

## 2016-06-25 NOTE — Telephone Encounter (Signed)
Message forwarded to patient [06/25/16 10:01am]: Ms. Geeting,  Unfortunately, Dr. Carmelia Roller is not in the office on Tuesdays. Have you been using one of the Allergy oral medications and Nasal Spray [over the counter] medications that Dr. Carmelia Roller suggested:   Claritin (loratadine), Allegra (fexofenadine), Zyrtec (cetirizine); these are listed in order from weakest to strongest. Generic, and therefore cheaper, options are in the parentheses. Flonase (fluticasone); nasal spray that is over the counter. 2 sprays each nostril, once daily. Aim towards the same side eye when you spray. There are available OTC, and the generic versions, which may be cheaper, are in parentheses. Show this to a pharmacist if you have trouble finding any of these items.  These should be helping dry any sinus mucus and/or ear fluid, which was suspected to be the issue with your right ear upon exam. I have an opening with Esperanza Richters, PA-C at 10:45am today, but could be filled at any time, then we could look at other offices for openings, as you will need to be seen if you have been using the above listed products without resolve, and also because you stated you are having pain; this needs assessment & evaluation. Please let us know.  Best Regards, Dionisio Paschal, CMA AAMA

## 2016-06-27 ENCOUNTER — Ambulatory Visit (INDEPENDENT_AMBULATORY_CARE_PROVIDER_SITE_OTHER): Payer: BLUE CROSS/BLUE SHIELD | Admitting: Medical

## 2016-06-27 ENCOUNTER — Encounter: Payer: Self-pay | Admitting: Medical

## 2016-06-27 VITALS — BP 139/86 | HR 76 | Temp 98.2°F | Resp 16 | Ht 62.0 in | Wt 172.0 lb

## 2016-06-27 DIAGNOSIS — H938X1 Other specified disorders of right ear: Secondary | ICD-10-CM

## 2016-06-27 DIAGNOSIS — H9191 Unspecified hearing loss, right ear: Secondary | ICD-10-CM | POA: Diagnosis not present

## 2016-06-27 MED ORDER — AMOXICILLIN-POT CLAVULANATE 875-125 MG PO TABS: 1.0000 | ORAL_TABLET | Freq: Two times a day (BID) | ORAL | 0 refills | Status: DC

## 2016-06-27 MED ORDER — FLUTICASONE PROPIONATE 50 MCG/ACT NA SUSP: 2.0000 | Freq: Every day | NASAL | 1 refills | Status: DC

## 2016-06-27 MED ORDER — NEOMYCIN-POLYMYXIN-HC 3.5-10000-1 OT SUSP: 3.0000 [drp] | Freq: Four times a day (QID) | OTIC | 0 refills | Status: DC

## 2016-06-27 NOTE — Progress Notes (Signed)
Pre visit review using our clinic review tool, if applicable. No additional management support is needed unless otherwise documented below in the visit note. 

## 2016-06-27 NOTE — Progress Notes (Signed)
Subjective:    Patient ID: Tammy Brennan, female    DOB: 11-27-1975, 41 y.o.   MRN: [id number]  HPI  Pt thinks she has wax in her rt  ear. Ear pressure and feels like she can't hear. This has been present for 3 weeks. She denies any allergies. No nasal congestion. No fever, no chills or sweats.  Review of Systems  Constitutional: Negative for chills, fatigue and fever.  HENT: Positive for ear pain. Negative for congestion, nosebleeds, postnasal drip, rhinorrhea, sinus pain and sinus pressure.        Ear pressure/clogged sensation.  Respiratory: Negative for cough, chest tightness, shortness of breath and wheezing.   Cardiovascular: Negative for chest pain and palpitations.  Gastrointestinal: Negative for abdominal pain.  Musculoskeletal: Negative for back pain.  Skin: Negative for rash.  Neurological: Negative for dizziness, light-headedness and headaches.  Hematological: Does not bruise/bleed easily.  Psychiatric/Behavioral: Negative for behavioral problems and confusion.    Past Medical History:  Diagnosis Date  . Carpal tunnel syndrome 06/20/2014  . Hyperlipidemia, mixed 10/10/2014  . Obesity 10/10/2014  . Preventative health care 09/27/2014   Sees Dr Arrie Aran for GYN  . Wart 10/10/2014     Social History   Social History  . Marital status: Married    Spouse name: N/A  . Number of children: N/A  . Years of education: N/A   Occupational History  . Not on file.   Social History Main Topics  . Smoking status: Never Smoker  . Smokeless tobacco: Never Used  . Alcohol use No  . Drug use: No  . 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 Not on file   Social History Narrative  . No narrative on file    No past surgical history on file.  Family History  Problem Relation Age of Onset  . Hypertension Mother   . Hypertension Father   . Cancer Father     lung,  previous light smoker  . Alcohol abuse Maternal Grandfather   . Heart disease Maternal Grandfather     MI  . Dementia Paternal Grandfather   . Hypertension Sister     No Known Allergies  Current Outpatient Prescriptions on File Prior to Visit  Medication Sig Dispense Refill  . norethindrone-ethinyl estradiol (MICROGESTIN,JUNEL,LOESTRIN) 1-20 MG-MCG tablet Take 1 tablet by mouth daily.    . phentermine (ADIPEX-P) 37.5 MG tablet Take 1 tablet by mouth daily.  2   No current facility-administered medications on file prior to visit.     BP 139/86   Pulse 76   Temp 98.2 F (36.8 C) (Oral)   Resp 16   Ht  (1.575 m)   Wt 172 lb (78 kg)   SpO2 100%   BMI 31.46 kg/m       Objective:   Physical Exam  General  Mental Status - Alert. General Appearance - Well groomed. Not in acute distress.  Skin Rashes- No Rashes.  HEENT Head- Normal. Ear Auditory Canal - Left- Normal. Right - Normal.Tympanic Membrane- Left- Normal. Right- Normal. Eye Sclera/Conjunctiva- Left- Normal. Faint mild boggy and Congested. Right-  Faint mild  boggy and  Congested.Bilateral  no maxillary and no frontal sinus pressure. Post lavage of rt ear produced no wax.    Mouth & Throat Lips: Upper Lip- Normal: no dryness, cracking, pallor, cyanosis, or vesicular eruption. Lower Lip-Normal: no  dryness, cracking, pallor, cyanosis or vesicular eruption. Buccal Mucosa- Bilateral- No Aphthous ulcers. Oropharynx- No Discharge or Erythema. Tonsils: Characteristics- Bilateral- No Erythema or Congestion. Size/Enlargement- Bilateral- No enlargement. Discharge- bilateral-None.  Neck Neck- Supple. No Masses.   Chest and Lung Exam Auscultation: Breath Sounds:-Clear even and unlabored.  Cardiovascular Auscultation:Rythm- Regular, rate and rhythm. Murmurs & Other Heart Sounds:Ausculatation of the heart reveal- No Murmurs.  Lymphatic Head & Neck General Head & Neck Lymphatics: Bilateral: Description- No  Localized lymphadenopathy.       Assessment & Plan:  For your ear pressure and pain will rx flonase nasal spray and cortisporin otic drops.   If over the weekend you have increased ear pain/severe then can start antibiotic but not indicated now.  Follow up in 7-10 days or as needed. If symptoms persist or hearing still not improved will refer to ENT.  Made a irrigation attempt at pt request. She is convinced wax might be present as her dad had wax told no wax initially but on irrigation wax was removed. Instructed MA to lightly lavage but no agresssive lavage.   Katelan Hirt, Ramon Dredge, PA-C

## 2016-06-27 NOTE — Patient Instructions (Signed)
For your ear pressure and pain will rx flonase nasal spray and cortisporin otic drops.   If over the weekend you have increased ear pain/severe then can start antibiotic but not indicated now.  Follow up in 7-10 days or as needed. If symptoms persist or hearing still not improved will refer to ENT.

## 2016-07-15 ENCOUNTER — Encounter: Payer: Self-pay | Admitting: *Deleted

## 2016-07-18 DIAGNOSIS — B078 Other viral warts: Secondary | ICD-10-CM | POA: Diagnosis not present

## 2016-08-02 DIAGNOSIS — M542 Cervicalgia: Secondary | ICD-10-CM

## 2016-08-02 DIAGNOSIS — H9203 Otalgia, bilateral: Secondary | ICD-10-CM | POA: Diagnosis not present

## 2016-08-02 HISTORY — DX: Cervicalgia: M54.2

## 2016-08-08 NOTE — Patient Instructions (Signed)
Canceled appointment but left in my open chart section?

## 2016-09-26 DIAGNOSIS — Z131 Encounter for screening for diabetes mellitus: Secondary | ICD-10-CM | POA: Diagnosis not present

## 2016-09-26 DIAGNOSIS — R5383 Other fatigue: Secondary | ICD-10-CM | POA: Diagnosis not present

## 2016-09-26 DIAGNOSIS — R632 Polyphagia: Secondary | ICD-10-CM | POA: Diagnosis not present

## 2016-09-26 DIAGNOSIS — E669 Obesity, unspecified: Secondary | ICD-10-CM | POA: Diagnosis not present

## 2016-10-15 DIAGNOSIS — M545 Low back pain: Secondary | ICD-10-CM | POA: Diagnosis not present

## 2016-10-15 DIAGNOSIS — M9902 Segmental and somatic dysfunction of thoracic region: Secondary | ICD-10-CM | POA: Diagnosis not present

## 2016-10-15 DIAGNOSIS — M9905 Segmental and somatic dysfunction of pelvic region: Secondary | ICD-10-CM | POA: Diagnosis not present

## 2016-10-15 DIAGNOSIS — M9903 Segmental and somatic dysfunction of lumbar region: Secondary | ICD-10-CM | POA: Diagnosis not present

## 2016-11-05 DIAGNOSIS — M542 Cervicalgia: Secondary | ICD-10-CM | POA: Diagnosis not present

## 2017-01-28 ENCOUNTER — Encounter: Payer: BLUE CROSS/BLUE SHIELD | Admitting: Family Medicine

## 2017-03-26 DIAGNOSIS — M4316 Spondylolisthesis, lumbar region: Secondary | ICD-10-CM | POA: Diagnosis not present

## 2017-03-26 DIAGNOSIS — M5416 Radiculopathy, lumbar region: Secondary | ICD-10-CM | POA: Diagnosis not present

## 2017-03-26 DIAGNOSIS — M5136 Other intervertebral disc degeneration, lumbar region: Secondary | ICD-10-CM | POA: Diagnosis not present

## 2017-04-07 DIAGNOSIS — M5416 Radiculopathy, lumbar region: Secondary | ICD-10-CM | POA: Diagnosis not present

## 2017-04-07 DIAGNOSIS — M545 Low back pain: Secondary | ICD-10-CM | POA: Diagnosis not present

## 2017-04-25 DIAGNOSIS — Z6835 Body mass index (BMI) 35.0-35.9, adult: Secondary | ICD-10-CM | POA: Diagnosis not present

## 2017-04-25 DIAGNOSIS — M4306 Spondylolysis, lumbar region: Secondary | ICD-10-CM | POA: Diagnosis not present

## 2017-04-25 DIAGNOSIS — R030 Elevated blood-pressure reading, without diagnosis of hypertension: Secondary | ICD-10-CM | POA: Diagnosis not present

## 2017-04-29 ENCOUNTER — Other Ambulatory Visit (HOSPITAL_BASED_OUTPATIENT_CLINIC_OR_DEPARTMENT_OTHER): Payer: Self-pay | Admitting: Student

## 2017-04-29 DIAGNOSIS — M4306 Spondylolysis, lumbar region: Secondary | ICD-10-CM

## 2017-05-03 ENCOUNTER — Ambulatory Visit (HOSPITAL_BASED_OUTPATIENT_CLINIC_OR_DEPARTMENT_OTHER): Payer: BLUE CROSS/BLUE SHIELD

## 2017-05-06 ENCOUNTER — Ambulatory Visit (HOSPITAL_BASED_OUTPATIENT_CLINIC_OR_DEPARTMENT_OTHER)
Admission: RE | Admit: 2017-05-06 | Discharge: 2017-05-06 | Disposition: A | Discharge status: Home / Self Care | Payer: BLUE CROSS/BLUE SHIELD | Source: Ambulatory Visit | Attending: Student | Admitting: Student

## 2017-05-06 DIAGNOSIS — M5136 Other intervertebral disc degeneration, lumbar region: Secondary | ICD-10-CM | POA: Insufficient documentation

## 2017-05-06 DIAGNOSIS — M5126 Other intervertebral disc displacement, lumbar region: Secondary | ICD-10-CM | POA: Diagnosis not present

## 2017-05-06 DIAGNOSIS — M4306 Spondylolysis, lumbar region: Secondary | ICD-10-CM | POA: Diagnosis not present

## 2017-05-22 DIAGNOSIS — M47817 Spondylosis without myelopathy or radiculopathy, lumbosacral region: Secondary | ICD-10-CM | POA: Diagnosis not present

## 2017-05-22 DIAGNOSIS — M4317 Spondylolisthesis, lumbosacral region: Secondary | ICD-10-CM | POA: Diagnosis not present

## 2017-05-22 DIAGNOSIS — R030 Elevated blood-pressure reading, without diagnosis of hypertension: Secondary | ICD-10-CM | POA: Diagnosis not present

## 2017-05-26 ENCOUNTER — Other Ambulatory Visit: Payer: Self-pay | Admitting: Neurosurgery

## 2017-05-26 ENCOUNTER — Other Ambulatory Visit: Payer: Self-pay | Admitting: *Deleted

## 2017-05-27 ENCOUNTER — Telehealth: Payer: Self-pay | Admitting: Vascular Surgery

## 2017-05-27 ENCOUNTER — Other Ambulatory Visit: Payer: Self-pay | Admitting: *Deleted

## 2017-05-27 NOTE — Telephone Encounter (Signed)
LVM for pt to call and confirm appts, mld lttrs, for ov on 4/23 pt to bring spine films

## 2017-05-27 NOTE — Telephone Encounter (Signed)
-----   Message from Retta Macebecca J Roberts, RN sent at 05/26/2017  4:33 PM EDT ----- Regarding: NEED OFFICE APPT AND CALL PLEASE MAKE AN OFFICE APPOINTMENT WITH DR. EARLY. ALIF IS SCHED FOR 07/14/17

## 2017-05-30 ENCOUNTER — Ambulatory Visit: Payer: BLUE CROSS/BLUE SHIELD | Admitting: Medical

## 2017-05-30 ENCOUNTER — Encounter: Payer: Self-pay | Admitting: Medical

## 2017-05-30 VITALS — BP 151/91 | HR 86 | Resp 16 | Ht 62.0 in | Wt 198.0 lb

## 2017-05-30 DIAGNOSIS — R079 Chest pain, unspecified: Secondary | ICD-10-CM | POA: Diagnosis not present

## 2017-05-30 DIAGNOSIS — R002 Palpitations: Secondary | ICD-10-CM | POA: Diagnosis not present

## 2017-05-30 DIAGNOSIS — I10 Essential (primary) hypertension: Secondary | ICD-10-CM | POA: Diagnosis not present

## 2017-05-30 DIAGNOSIS — E785 Hyperlipidemia, unspecified: Secondary | ICD-10-CM

## 2017-05-30 MED ORDER — LOSARTAN POTASSIUM 100 MG PO TABS: 100.0000 mg | ORAL_TABLET | Freq: Every day | ORAL | 0 refills | Status: DC

## 2017-05-30 NOTE — Patient Instructions (Addendum)
For your history of high blood pressure recently, I am placing losartan blood pressure medication.  Your blood pressure is not been high presently but it has been very high over the past week.  If you have recurrent high blood pressures as before despite losartan then you may need to be seen in the ED.  Do recommend ED evaluation if he were to have any neurologic or cardiac type signs or symptoms.  For history of chest pain, back pain and shoulder pain, we did get an EKG today(sinus rhythm. Does look very similar. No obvious ischemic changes).  Also you do have the history of palpitations intermittently over the past year.  I do think it would be best to go ahead and refer you to cardiologist for both the chest pain and palpitations.  They may get Holter monitor and make a stress test if they think it is indicated.  If you were to have recurrent chest pain would recommend ED evaluation as well.  I did place future metabolic panel and lipid panel today.  Please get that scheduled to be done fasting.  For both a high blood pressure and history of palpitations recommend avoiding caffeine.  Follow-up in 7 days for blood pressure check.  r as needed.  Also on the day that she did get your blood pressure check please bring him your blood pressure monitor.  We will check your reading against ours.

## 2017-05-30 NOTE — Progress Notes (Signed)
Subjective:    Patient ID: Tammy Brennan, female    DOB: [birth year], 42 y.o.   MRN: [id number]  HPI   Pt bp since last year have been border line high. Pt checked her bp at home one time and her blood pressure 231/100, 161/102, 169/107, 176/105,169/102 and 150/100. These readings from Sunday up until today. No report of any fross motor or sensory function deficits.  Lowest was 147/92.  Sunday had the highest  bp reading.  On Sunday from 3 pm until 8 pm she did have some pain upper back and chest. Then resolved. No pain since.  No jaw pain. She did have some faint left arm pain. No nausea or vomiting. Felt mild sob just sitting. Later that day when she got home is when her bp was 231/100.   Note also pt does have random brief episodes of palpitations. Last 5-10 seconds. May happen 1-2 times a week. For past 2 years. Rare caffeine beverage.  No direct fh MI or stroke. Non smoker.  Not diabetic. Moderate high hx triglycerides.   Review of Systems  Constitutional: Negative for chills, fatigue and fever.  Respiratory: Negative for cough, chest tightness, shortness of breath and wheezing.        Faint mild shortness of breath  Cardiovascular: Positive for palpitations. Negative for chest pain.       Some chest pain on Sunday as well.  No current chest pain or palpitations.  Gastrointestinal: Negative for abdominal pain.  Musculoskeletal: Positive for back pain.       Only on Sunday.  Skin: Negative for rash.  Neurological: Negative for dizziness, syncope, speech difficulty, weakness and light-headedness.  Hematological: Negative for adenopathy. Does not bruise/bleed easily.  Psychiatric/Behavioral: Negative for behavioral problems, confusion and decreased concentration.   Past Medical History:  Diagnosis Date  . Carpal tunnel syndrome 06/20/2014  . Hyperlipidemia, mixed 10/10/2014  . Obesity 10/10/2014  . Preventative health care 09/27/2014   Sees Dr Arrie Aran for GYN  .  Wart 10/10/2014     Social History   Socioeconomic History  . Marital status: Married    Spouse name: Not on file  . Number of children: Not on file  . Years of education: Not on file  . Highest education level: Not on file  Occupational History  . Not on file  Social Needs  . Financial resource strain: Not on file  . Food insecurity:    Worry: Not on file    Inability: Not on file  . Transportation needs:    Medical: Not on file    Non-medical: Not on file  Tobacco Use  . Smoking status: Never Smoker  . Smokeless tobacco: Never Used  Substance and Sexual Activity  . Alcohol use: No    Alcohol/week: 0.0 oz  . Drug use: No  . 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 Physical activity:    Days per week: Not on file    Minutes per session: Not on file  . Stress: Not on file  Relationships  . Social connections:    Talks on phone: Not on file    Gets together: Not on file    Attends religious service: Not on file    Active member of club or organization: Not on file    Attends meetings of clubs or organizations: Not on file    Relationship status: Not on  file  . Intimate partner violence:    Fear of current or ex partner: Not on file    Emotionally abused: Not on file    Physically abused: Not on file    Forced sexual activity: Not on file  Other Topics Concern  . Not on file  Social History Narrative  . Not on file    No past surgical history on file.  Family History  Problem Relation Age of Onset  . Hypertension Mother   . Hypertension Father   . Cancer Father        lung, previous light smoker  . Alcohol abuse Maternal Grandfather   . Heart disease Maternal Grandfather        MI  . Dementia Paternal Grandfather   . Hypertension Sister     No Known Allergies  Current Outpatient Medications on File Prior to Visit  Medication Sig Dispense Refill  .  norethindrone-ethinyl estradiol (MICROGESTIN,JUNEL,LOESTRIN) 1-20 MG-MCG tablet Take 1 tablet by mouth daily.     No current facility-administered medications on file prior to visit.     BP (!) 151/91 (BP Location: Right Arm, Patient Position: Sitting, Cuff Size: Large)   Pulse 86   Resp 16   Ht 5\' 2"  (1.575 m)   Wt 198 lb (89.8 kg)   SpO2 100%   BMI 36.21 kg/m       Objective:   Physical Exam  General Mental Status- Alert. General Appearance- Not in acute distress.   Skin General: Color- Normal Color. Moisture- Normal Moisture.  Neck Carotid Arteries- Normal color. Moisture- Normal Moisture. No carotid bruits. No JVD.  Chest and Lung Exam Auscultation: Breath Sounds:-Normal.  Cardiovascular Auscultation:Rythm- Regular. Murmurs & Other Heart Sounds:Auscultation of the heart reveals- No Murmurs.  Abdomen Inspection:-Inspeection Normal. Palpation/Percussion:Note:No mass. Palpation and Percussion of the abdomen reveal- Non Tender, Non Distended + BS, no rebound or guarding.    Neurologic Cranial Nerve exam:- CN III-XII intact(No nystagmus), symmetric smile. Strength:- 5/5 equal and symmetric strength both upper and lower extremities.  Anterior thorax- no pain upon palpation of the costochondral junction. Back- no pain on palpation of upper thorax. Left shoulder-no pain on range of motion.  No pain on palpation.     Assessment & Plan:  For your history of high blood pressure recently, I am placing losartan blood pressure medication.  Your blood pressure is not been high presently but it has been very high over the past week.  If you have recurrent high blood pressures as before despite losartan then you may need to be seen in the ED.  Do recommend ED evaluation if he were to have any neurologic or cardiac type signs or symptoms.  For history of chest pain, back pain and shoulder pain, we did get an EKG today.  Also you do have the history of palpitations  intermittently over the past year.  I do think it would be best to go ahead and refer you to cardiologist for both the chest pain and palpitations.  They may get Holter monitor and make a stress test if they think it is indicated.  If you were to have recurrent chest pain would recommend ED evaluation as well.  I did place future metabolic panel and lipid panel today.  Please get that scheduled to be done fasting.  For both a high blood pressure and history of palpitations recommend avoiding caffeine.  Follow-up in 7 days for blood pressure check.  r as needed.  Also on the day  that she did get your blood pressure check please bring him your blood pressure monitor.  We will check your reading against ours.  40 minutes spent with pt. 50% of time counseling pt on recent chest pain,palpitations and recent recent high bp. Differential diagnosis, treatment plan and work up going forward. Education on plans as well in event worsening or changing signs and symptoms.    Esperanza RichtersEdward Markcus Lazenby, PA-C

## 2017-06-02 ENCOUNTER — Other Ambulatory Visit (INDEPENDENT_AMBULATORY_CARE_PROVIDER_SITE_OTHER): Payer: BLUE CROSS/BLUE SHIELD

## 2017-06-02 DIAGNOSIS — I10 Essential (primary) hypertension: Secondary | ICD-10-CM | POA: Diagnosis not present

## 2017-06-02 DIAGNOSIS — Z01419 Encounter for gynecological examination (general) (routine) without abnormal findings: Secondary | ICD-10-CM | POA: Diagnosis not present

## 2017-06-02 DIAGNOSIS — Z1231 Encounter for screening mammogram for malignant neoplasm of breast: Secondary | ICD-10-CM | POA: Diagnosis not present

## 2017-06-02 DIAGNOSIS — Z6835 Body mass index (BMI) 35.0-35.9, adult: Secondary | ICD-10-CM | POA: Diagnosis not present

## 2017-06-02 LAB — COMPREHENSIVE METABOLIC PANEL
ALBUMIN: 3.4 g/dL — AB (ref 3.5–5.2)
ALK PHOS: 54 U/L (ref 39–117)
ALT: 12 U/L (ref 0–35)
AST: 14 U/L (ref 0–37)
BUN: 16 mg/dL (ref 6–23)
CALCIUM: 9 mg/dL (ref 8.4–10.5)
CO2: 28 mEq/L (ref 19–32)
Chloride: 102 mEq/L (ref 96–112)
Creatinine, Ser: 0.6 mg/dL (ref 0.40–1.20)
GFR: 115.71 mL/min (ref >60.00)
Glucose, Bld: 96 mg/dL (ref 70–99)
POTASSIUM: 3.9 meq/L (ref 3.5–5.1)
Sodium: 138 mEq/L (ref 135–145)
TOTAL PROTEIN: 6.7 g/dL (ref 6.0–8.3)
Total Bilirubin: 0.6 mg/dL (ref 0.2–1.2)

## 2017-06-02 LAB — LIPID PANEL
CHOLESTEROL: 183 mg/dL (ref 0–200)
HDL: 72.9 mg/dL (ref >39.00)
LDL Cholesterol: 71 mg/dL (ref 0–99)
NonHDL: 110.35
TRIGLYCERIDES: 196 mg/dL — AB (ref 0.0–149.0)
Total CHOL/HDL Ratio: 3
VLDL: 39.2 mg/dL (ref 0.0–40.0)

## 2017-06-06 ENCOUNTER — Telehealth: Payer: Self-pay | Admitting: Medical

## 2017-06-06 ENCOUNTER — Ambulatory Visit: Payer: BLUE CROSS/BLUE SHIELD | Admitting: Medical

## 2017-06-06 ENCOUNTER — Encounter: Payer: Self-pay | Admitting: Medical

## 2017-06-06 VITALS — BP 140/88 | HR 82 | Resp 16 | Ht 62.0 in | Wt 194.8 lb

## 2017-06-06 DIAGNOSIS — I10 Essential (primary) hypertension: Secondary | ICD-10-CM

## 2017-06-06 MED ORDER — HYDROCHLOROTHIAZIDE 12.5 MG PO CAPS: 12.5000 mg | ORAL_CAPSULE | Freq: Every day | ORAL | 3 refills | Status: DC

## 2017-06-06 NOTE — Patient Instructions (Addendum)
Your blood pressures have improved but would like to see levels consistently less than 140/90.  Continue losartan 100 mg daily and adding low-dose diuretic.  Please keep checking blood pressure daily over the next week and I am hoping to see some blood pressures closer to 130/80.  If this does improve your blood pressures then next month could give combination of losartan/HCTZ 1 tablet.  If you check BP daily then could just my chart me the results in 1 week.  Will send Dr. Abner GreenspanBlyth message asking her opininion on continuous use.(after hours reviewed and her ocp does have some progesterone)  Follow up in one week by my chart or as needed

## 2017-06-06 NOTE — Telephone Encounter (Signed)
Dr. Abner GreenspanBlyth,  I saw a patient of yours today for hypertension follow-up.  At the end  she brought up a question regarding whether or not she should continue taking her 1-20 1 tab daily continuously.  She mentioned that for years she has not had a menstrual cycle.  Dr. Rosemary Holmsavon  years ago put her on the medication.  The new gynecologist told her she might want to mention that she is not having any cycles and to get opinion from PCP regarding this.  I guess new gynecologist just wanted you to be aware and now patient wanted confirmation that she is taking the medication safely.  I have my own opinions regarding this but did not to speak for you directly.  How you feel about this and I will let patient know.  Thanks,  Ramon DredgeEdward

## 2017-06-06 NOTE — Progress Notes (Signed)
Subjective:    Patient ID: Tammy Brennan, female    DOB: 02/02/1976, 42 y.o.   MRN: [id number]  HPI  Pt seen one week ago. Her bp are little better but not  Improved. No ha or gross motor/sensory function deficits.   Pt has been checking her bp daily at home. She did get a bp cuff.  Pt did get a call to see cardiologist on Tuesday at [facility]. No recurrent chest pain since last visit.  Pt has been cutting back on salt and sodas.  Pt last week bp readings showed about 2/3 of bp above 140/90 and other readings just little bit less.   Review of Systems  Constitutional: Negative for chills, fatigue and fever.  Respiratory: Negative for cough, chest tightness, shortness of breath and wheezing.   Cardiovascular: Negative for chest pain and palpitations.  Gastrointestinal: Negative for abdominal pain.  Genitourinary: Negative for dyspareunia.  Musculoskeletal: Negative for back pain.  Neurological: Negative for dizziness, seizures, speech difficulty, light-headedness and headaches.  Hematological: Negative for adenopathy. Does not bruise/bleed easily.  Psychiatric/Behavioral: Negative for behavioral problems and confusion.    Past Medical History:  Diagnosis Date  . Carpal tunnel syndrome 06/20/2014  . Hyperlipidemia, mixed 10/10/2014  . Obesity 10/10/2014  . Preventative health care 09/27/2014   Sees Dr Arrie Aranichard Tayvon for GYN  . Wart 10/10/2014     Social History   Socioeconomic History  . Marital status: Married    Spouse name: Not on file  . Number of children: Not on file  . Years of education: Not on file  . Highest education level: Not on file  Occupational History  . Not on file  Social Needs  . Financial resource strain: Not on file  . Food insecurity:    Worry: Not on file    Inability: Not on file  . Transportation needs:    Medical: Not on file    Non-medical: Not on file  Tobacco Use  . Smoking status: Never Smoker  . Smokeless tobacco: Never Used    Substance and Sexual Activity  . Alcohol use: No    Alcohol/week: 0.0 oz  . Drug use: No  . 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 Physical activity:    Days per week: Not on file    Minutes per session: Not on file  . Stress: Not on file  Relationships  . Social connections:    Talks on phone: Not on file    Gets together: Not on file    Attends religious service: Not on file    Active member of club or organization: Not on file    Attends meetings of clubs or organizations: Not on file    Relationship status: Not on file  . Intimate partner violence:    Fear of current or ex partner: Not on file    Emotionally abused: Not on file    Physically abused: Not on file    Forced sexual activity: Not on file  Other Topics Concern  . Not on file  Social History Narrative  . Not on file    No past surgical history on file.  Family History  Problem Relation Age of Onset  . Hypertension Mother   . Hypertension Father   . Cancer Father        lung, previous light smoker  . Alcohol abuse Maternal Grandfather   .  Heart disease Maternal Grandfather        MI  . Dementia Paternal Grandfather   . Hypertension Sister     No Known Allergies  Current Outpatient Medications on File Prior to Visit  Medication Sig Dispense Refill  . losartan (COZAAR) 100 MG tablet Take 1 tablet (100 mg total) by mouth daily. 30 tablet 0  . norethindrone-ethinyl estradiol (MICROGESTIN,JUNEL,LOESTRIN) 1-20 MG-MCG tablet Take 1 tablet by mouth daily.     No current facility-administered medications on file prior to visit.     BP (!) 143/94 (BP Location: Left Arm, Patient Position: Sitting, Cuff Size: Large)   Pulse 82   Resp 16   Ht 5\' 2"  (1.575 m)   Wt 194 lb 12.8 oz (88.4 kg)   SpO2 99%   BMI 35.63 kg/m       Objective:   Physical Exam  General Mental Status- Alert. General Appearance- Not in  acute distress.   Skin General: Color- Normal Color. Moisture- Normal Moisture.  Neck Carotid Arteries- Normal color. Moisture- Normal Moisture. No carotid bruits. No JVD.  Chest and Lung Exam Auscultation: Breath Sounds:-Normal.  Cardiovascular Auscultation:Rythm- Regular. Murmurs & Other Heart Sounds:Auscultation of the heart reveals- No Murmurs.  Abdomen Inspection:-Inspeection Normal. Palpation/Percussion:Note:No mass. Palpation and Percussion of the abdomen reveal- Non Tender, Non Distended + BS, no rebound or guarding.    Neurologic Cranial Nerve exam:- CN III-XII intact(No nystagmus), symmetric smile. Strength:- 5/5 equal and symmetric strength both upper and lower extremities.      Assessment & Plan:  Your blood pressures have improved but would like to see levels consistently less than 140/90.  Continue losartan 100 mg daily and adding low-dose diuretic.  Please keep checking blood pressure daily over the next week and I am hoping to see some blood pressures closer to 130/80.  If this does improve your blood pressures then next month could give combination of losartan/HCTZ 1 tablet.  If you check BP daily then could just my chart me the results in 1 week.  Will send Dr. Abner Greenspan message asking her opininion on continuous use.  Follow up in one week by my chart or as needed  Esperanza Richters, PA-C

## 2017-06-08 NOTE — Telephone Encounter (Signed)
No known medical reason why she cannot continue to suppress her cycles. At some point later in her 2840s she may want to try to take a break to try and see when she is perimenopausal

## 2017-06-09 NOTE — Telephone Encounter (Signed)
If you will call pt and let her know Dr. Abner GreenspanBlyth opinion. If she does not understand why gynecologist brought up concern in first place. I can try to explain to patient.  Thanks.

## 2017-06-10 ENCOUNTER — Ambulatory Visit: Payer: BLUE CROSS/BLUE SHIELD | Admitting: Cardiology

## 2017-06-10 ENCOUNTER — Encounter: Payer: Self-pay | Admitting: Cardiology

## 2017-06-10 ENCOUNTER — Telehealth: Payer: Self-pay | Admitting: *Deleted

## 2017-06-10 DIAGNOSIS — R0683 Snoring: Secondary | ICD-10-CM | POA: Diagnosis not present

## 2017-06-10 DIAGNOSIS — E781 Pure hyperglyceridemia: Secondary | ICD-10-CM | POA: Diagnosis not present

## 2017-06-10 DIAGNOSIS — I10 Essential (primary) hypertension: Secondary | ICD-10-CM | POA: Diagnosis not present

## 2017-06-10 DIAGNOSIS — R002 Palpitations: Secondary | ICD-10-CM

## 2017-06-10 HISTORY — DX: Pure hyperglyceridemia: E78.1

## 2017-06-10 HISTORY — DX: Essential (primary) hypertension: I10

## 2017-06-10 HISTORY — DX: Palpitations: R00.2

## 2017-06-10 HISTORY — DX: Snoring: R06.83

## 2017-06-10 NOTE — Progress Notes (Signed)
Cardiology Consultation:    Date:  06/10/2017   ID:  Tammy AlasJennifer K Swiger, DOB 09/16/1975, MRN [id number]  PCP:  Bradd CanaryBlyth, Stacey A, MD  Cardiologist:  Gypsy Balsamobert Krasowski, MD   Referring MD: Marisue BrooklynSaguier, Edward, PA-C   Chief Complaint  Patient presents with  . Chest Pain  . Palpitations    for a while  Have palpitations  History of Present Illness:    Tammy Brennan is a 42 y.o. female who is being seen today for the evaluation of palpitations at the request of Saguier, Ramon Dredgedward, [place].  For years she is being experiencing palpitations she described this as a strong forceful beating somewhat irregular in the chest.  No sustained arrhythmias.  Interestingly sometimes she is awakened in the middle of the night by this sensation.  Her mother as well as sister do have history of paroxysmal atrial fibrillation.  She also described to have some atypical chest pain.  She said one time she was sitting watching her son playing football and then she started experiencing kind of heavy sensation in the left side of chest twisting her body make the sensation worse.  That sensation lasted for about 3 hours and then went away there is no shortness of breath no sweating no palpitation associated with this sensation.  Her ability to exercise is limited by the fact that she had chronic back problem in the matter-of-fact she is scheduled to have back surgery.  That will be done in May. She snores and snores a lot.  Has never been tested for sleep apnea. Does not have diabetes her cholesterol is very good.  She does have high triglycerides.  Past Medical History:  Diagnosis Date  . Carpal tunnel syndrome 06/20/2014  . Essential hypertension 06/10/2017  . Hyperlipidemia, mixed 10/10/2014  . Obesity 10/10/2014  . Preventative health care 09/27/2014   Sees Dr Arrie Aranichard Tayvon for GYN  . Wart 10/10/2014    No past surgical history on file.  Current Medications: Current Meds  Medication Sig  . hydrochlorothiazide (MICROZIDE)  12.5 MG capsule Take 1 capsule (12.5 mg total) by mouth daily.  Marland Kitchen. losartan (COZAAR) 100 MG tablet Take 1 tablet (100 mg total) by mouth daily.  . norethindrone-ethinyl estradiol (MICROGESTIN,JUNEL,LOESTRIN) 1-20 MG-MCG tablet Take 1 tablet by mouth daily.     Allergies:   Patient has no known allergies.   Social History   Socioeconomic History  . Marital status: Married    Spouse name: Not on file  . Number of children: Not on file  . Years of education: Not on file  . Highest education level: Not on file  Occupational History  . Not on file  Social Needs  . Financial resource strain: Not on file  . Food insecurity:    Worry: Not on file    Inability: Not on file  . Transportation needs:    Medical: Not on file    Non-medical: Not on file  Tobacco Use  . Smoking status: Never Smoker  . Smokeless tobacco: Never Used  Substance and Sexual Activity  . Alcohol use: No    Alcohol/week: 0.0 oz  . Drug use: No  . Sexual activity: Yes    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, no dietary restrictions, [employer]ant [employer]  Lifestyle  . Physical activity:    Days per week: Not on file    Minutes per session: Not on file  . Stress: Not on file  Relationships  . Social connections:  Talks on phone: Not on file    Gets together: Not on file    Attends religious service: Not on file    Active member of club or organization: Not on file    Attends meetings of clubs or organizations: Not on file    Relationship status: Not on file  Other Topics Concern  . Not on file  Social History Narrative  . Not on file     Family History: The patient's family history includes Alcohol abuse in her maternal grandfather; Cancer in her father; Dementia in her paternal grandfather; Heart disease in her maternal grandfather; Hypertension in her father, mother, and sister. ROS:   Please see the history of present illness.    All 14 point review of systems negative except as described  per history of present illness.  EKGs/Labs/Other Studies Reviewed:    The following studies were reviewed today: EKG showed normal sinus rhythm normal P interval no diagnostic ST segment changes    Recent Labs: 06/21/2016: Hemoglobin 13.2; Platelets 449.0; TSH 2.38 06/02/2017: ALT 12; BUN 16; Creatinine, Ser 0.60; Potassium 3.9; Sodium 138  Recent Lipid Panel    Component Value Date/Time   CHOL 183 06/02/2017 0713   TRIG 196.0 (H) 06/02/2017 0713   HDL 72.90 06/02/2017 0713   CHOLHDL 3 06/02/2017 0713   VLDL 39.2 06/02/2017 0713   LDLCALC 71 06/02/2017 0713   LDLDIRECT 94.0 09/27/2014 1044    Physical Exam:    VS:  BP 116/60   Pulse 92   Resp 12   Ht 5\' 2"  (1.575 m)   Wt 193 lb 1.9 oz (87.6 kg)   BMI 35.32 kg/m     Wt Readings from Last 3 Encounters:  06/10/17 193 lb 1.9 oz (87.6 kg)  06/06/17 194 lb 12.8 oz (88.4 kg)  05/30/17 198 lb (89.8 kg)     GEN:  Well nourished, well developed in no acute distress HEENT: Normal NECK: No JVD; No carotid bruits LYMPHATICS: No lymphadenopathy CARDIAC: RRR, no murmurs, no rubs, no gallops RESPIRATORY:  Clear to auscultation without rales, wheezing or rhonchi  ABDOMEN: Soft, non-tender, non-distended MUSCULOSKELETAL:  No edema; No deformity  SKIN: Warm and dry NEUROLOGIC:  Alert and oriented x 3 PSYCHIATRIC:  Normal affect   ASSESSMENT:    1. Palpitations   2. Hypertriglyceridemia   3. Snoring   4. Essential hypertension    PLAN:    In order of problems listed above:  1. Palpitations: I will ask her to wear event recorder.  She also will be scheduled to have an echocardiogram to assess structurally her heart. 2. Hypertriglyceridemia will talk about good diet avoiding carbohydrates and then in the future after she will have her back fix people talk about exercises which can help a great deal 3. Snoring: I suspect she does have sleep apnea will schedule her to have sleep study.  That can explain her fatigue tiredness as  well as a difficult to control blood pressure.  All those problems and became much easier controllable with proper management of sleep apnea 4. Essential hypertension: Blood pressure appears to be controlled right now.  See her back in my office in about 6 weeks or sooner if she has a problem.  Event recorder will be placed for 1 month, echocardiogram will be done sleep study will be scheduled.   Medication Adjustments/Labs and Tests Ordered: Current medicines are reviewed at length with the patient today.  Concerns regarding medicines are outlined above.  No orders  of the defined types were placed in this encounter.  No orders of the defined types were placed in this encounter.   Signed, Georgeanna Lea, MD, [facility]. 06/10/2017 2:20 PM    [facility]

## 2017-06-10 NOTE — Telephone Encounter (Signed)
Patient had to leave appointment before getting AVS and appointments scheduled to pick up her kids from school. Left message for patient regarding echocardiogram and monitor appointment dates and times. Informed patient that I have sent her a copy of her AVS with all this information in the mail. Advised her to call me if she has any questions.

## 2017-06-10 NOTE — Patient Instructions (Signed)
Medication Instructions:  Your physician recommends that you continue on your current medications as directed. Please refer to the Current Medication list given to you today.   Labwork: None  Testing/Procedures: Your physician has recommended that you wear an event monitor. Event monitors are medical devices that record the heart's electrical activity. Doctors most often us these monitors to diagnose arrhythmias. Arrhythmias are problems with the speed or rhythm of the heartbeat. The monitor is a small, portable device. You can wear one while you do your normal daily activities. This is usually used to diagnose what is causing palpitations/syncope (passing out). Wear for 30 days.   Your physician has requested that you have an echocardiogram. Echocardiography is a painless test that uses sound waves to create images of your heart. It provides your doctor with information about the size and shape of your heart and how well your heart's chambers and valves are working. This procedure takes approximately one hour. There are no restrictions for this procedure.  You will be contacted to schedule a sleep study.    Follow-Up: Your physician recommends that you schedule a follow-up appointment in: 6 weeks.   Any Other Special Instructions Will Be Listed Below (If Applicable).     If you need a refill on your cardiac medications before your next appointment, please call your pharmacy.

## 2017-06-18 ENCOUNTER — Ambulatory Visit (HOSPITAL_BASED_OUTPATIENT_CLINIC_OR_DEPARTMENT_OTHER): Payer: BLUE CROSS/BLUE SHIELD

## 2017-06-26 ENCOUNTER — Other Ambulatory Visit: Payer: Self-pay | Admitting: Medical

## 2017-07-01 ENCOUNTER — Other Ambulatory Visit: Payer: Self-pay

## 2017-07-01 ENCOUNTER — Ambulatory Visit: Payer: BLUE CROSS/BLUE SHIELD | Admitting: Vascular Surgery

## 2017-07-01 ENCOUNTER — Encounter: Payer: Self-pay | Admitting: Vascular Surgery

## 2017-07-01 VITALS — BP 122/78 | HR 85 | Temp 85.0°F | Resp 16 | Ht 62.0 in | Wt 192.0 lb

## 2017-07-01 DIAGNOSIS — M5137 Other intervertebral disc degeneration, lumbosacral region: Secondary | ICD-10-CM

## 2017-07-01 NOTE — Progress Notes (Signed)
Vascular and Vein Specialist of [place]  Patient name: Tammy Brennan MRN: [id number] DOB: November 28, 1975 Sex: female  REASON FOR CONSULT: Discuss anterior exposure for L5-S1 disc surgery with Dr. Donalee Citrin  HPI: Tammy Brennan is a 42 y.o. female, who is here today for discussion of anterior exposure for L5 5 S1 disc fusion.  She is a very Furniture conservator/restorer.  Here today with her mother for discussion of anterior exposure.  She reports that she has had difficulty since 2012 in 2013.  She does have some back discomfort but this is mostly manifested with right leg pain.  She initially was having some relief with spine injections but this is no longer helpful and is making very difficult for her to do her job.  She also has a care of her 34 and 4-year-old children.  Past Medical History:  Diagnosis Date  . Carpal tunnel syndrome 06/20/2014  . Essential hypertension 06/10/2017  . Hyperlipidemia, mixed 10/10/2014  . Obesity 10/10/2014  . Preventative health care 09/27/2014   Sees Dr Arrie Aran for GYN  . Wart 10/10/2014    Family History  Problem Relation Age of Onset  . Hypertension Mother   . Hypertension Father   . Cancer Father        lung, previous light smoker  . Alcohol abuse Maternal Grandfather   . Heart disease Maternal Grandfather        MI  . Dementia Paternal Grandfather   . Hypertension Sister     SOCIAL HISTORY: Social History   Socioeconomic History  . Marital status: Married    Spouse name: Not on file  . Number of children: Not on file  . Years of education: Not on file  . Highest education level: Not on file  Occupational History  . Not on file  Social Needs  . Financial resource strain: Not on file  . Food insecurity:    Worry: Not on file    Inability: Not on file  . Transportation needs:    Medical: Not on file    Non-medical: Not on file  Tobacco Use  . Smoking status: Never Smoker  . Smokeless  tobacco: Never Used  Substance and Sexual Activity  . Alcohol use: No    Alcohol/week: 0.0 oz  . Drug use: No  . Sexual activity: Yes    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, no dietary restrictions, 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[employer]  Lifestyle  . Physical activity:    Days per week: Not on file    Minutes per session: Not on file  . Stress: Not on file  Relationships  . Social connections:    Talks on phone: Not on file    Gets together: Not on file    Attends religious service: Not on file    Active member of club or organization: Not on file    Attends meetings of clubs or organizations: Not on file    Relationship status: Not on file  . Intimate partner violence:    Fear of current or ex partner: Not on file    Emotionally abused: Not on file    Physically abused: Not on file    Forced sexual activity: Not on file  Other Topics Concern  . Not on file  Social History Narrative  . Not on file    No Known Allergies  Current Outpatient Medications  Medication Sig Dispense Refill  . aspirin-acetaminophen-caffeine (EXCEDRIN MIGRAINE) 250-250-65 MG tablet Take 2 tablets  by mouth 2 (two) times daily as needed for headache.    . Biotin 5000 MCG TABS Take 5,000 mcg by mouth daily.    . hydrochlorothiazide (MICROZIDE) 12.5 MG capsule Take 1 capsule (12.5 mg total) by mouth daily. 30 capsule 3  . losartan (COZAAR) 100 MG tablet TAKE 1 TABLET(100 MG) BY MOUTH DAILY 30 tablet 0  . norethindrone-ethinyl estradiol (MICROGESTIN,JUNEL,LOESTRIN) 1-20 MG-MCG tablet Take 1 tablet by mouth daily.    . valACYclovir (VALTREX) 1000 MG tablet Take 2 g by mouth 2 (two) times daily as needed. Cold sores.  6   No current facility-administered medications for this visit.     REVIEW OF SYSTEMS:  [X]  denotes positive finding, [ ]  denotes negative finding Cardiac  Comments:  Chest pain or chest pressure:    Shortness of breath upon exertion:    Short of breath when lying flat:    Irregular heart  rhythm:        Vascular    Pain in calf, thigh, or hip brought on by ambulation:    Pain in feet at night that wakes you up from your sleep:     Blood clot in your veins:    Leg swelling:         Pulmonary    Oxygen at home:    Productive cough:     Wheezing:         Neurologic    Sudden weakness in arms or legs:     Sudden numbness in arms or legs:     Sudden onset of difficulty speaking or slurred speech:    Temporary loss of vision in one eye:     Problems with dizziness:         Gastrointestinal    Blood in stool:     Vomited blood:         Genitourinary    Burning when urinating:     Blood in urine:        Psychiatric    Major depression:         Hematologic    Bleeding problems:    Problems with blood clotting too easily:        Skin    Rashes or ulcers:        Constitutional    Fever or chills:      PHYSICAL EXAM: Vitals:   07/01/17 1531  BP: 122/78  Pulse: 85  Resp: 16  Temp: (!) 85 F (29.4 C)  TempSrc: Oral  SpO2: 99%  Weight: 192 lb (87.1 kg)  Height: 5\' 2"  (1.575 m)    GENERAL: The patient is a well-nourished female, in no acute distress. The vital signs are documented above. CARDIOVASCULAR: 2+ radial and 2+ dorsalis pedis pulses bilaterally PULMONARY: There is good air exchange  ABDOMEN: Soft and non-tender.  Low Pfannenstiel incision from her prior cesarean sections x2  mUSCULOSKELETAL: There are no major deformities or cyanosis. NEUROLOGIC: No focal weakness or paresthesias are detected. SKIN: There are no ulcers or rashes noted. PSYCHIATRIC: The patient has a normal affect.  DATA:  None  MEDICAL ISSUES: Discussed my role in anterior exposure with the patient and her mother present.  Explained low left lower quadrant transverse incision from the midline laterally.  Described mobilization of the rectus muscle and retroperitoneal exposure with mobilization of the intraperitoneal contents to the right and also mobilization of the left  ureter to the right.  Described mobilization of the iliac vessels over L5-S1.  I did  discuss the potential injury to all the structures.  Explained that the venous structures are the most vulnerable with some risk for bleeding that would require venous repair.  Patient understands and wished to proceed with surgery as scheduled.  I do not see any contraindications to anterior exposure   Larina Earthlyodd F. Ericson Nafziger, MD [facility] Office Tel [phone] Pager [phone]

## 2017-07-04 NOTE — Pre-Procedure Instructions (Signed)
Elon AlasJennifer K Verdi  07/04/2017      [phone] - [place],  - [address] [place] [id number] Phone: [phone] Fax: [phone]    Your procedure is scheduled on Monday May 6.  [facility]  Call this number if you have problems the morning of surgery:  [phone]   Remember:  Do not eat food or drink liquids after midnight.  Take these medicines the morning of surgery with A SIP OF WATER: NONE  7 days prior to surgery STOP taking any Aspirin(unless otherwise instructed by your surgeon), Aleve, Naproxen, Ibuprofen, Motrin, Advil, Goody's, BC's, all herbal medications, fish oil, and all vitamins    Do not wear jewelry, make-up or nail polish.  Do not wear lotions, powders, or perfumes, or deodorant.  Do not shave 48 hours prior to surgery.  Men may shave face and neck.  Do not bring valuables to the hospital.  [facility] is not responsible for any belongings or valuables.  Contacts, dentures or bridgework may not be worn into surgery.  Leave your suitcase in the car.  After surgery it may be brought to your room.  For patients admitted to the hospital, discharge time will be determined by your treatment team.  Patients discharged the day of surgery will not be allowed to drive home.   Special instructions:    [place]- Preparing For Surgery  Before surgery, you can play an important role. Because skin is not sterile, your skin needs to be as free of germs as possible. You can reduce the number of germs on your skin by washing with CHG (chlorahexidine gluconate) Soap before surgery.  CHG is an antiseptic cleaner which kills germs and bonds with the skin to continue killing germs even after washing.  Please do not use if you have an allergy to CHG or antibacterial soaps. If your skin becomes reddened/irritated stop using the CHG.  Do not shave  (including legs and underarms) for at least 48 hours prior to first CHG shower. It is OK to shave your face.  Please follow these instructions carefully.   1. Shower the NIGHT BEFORE SURGERY and the MORNING OF SURGERY with CHG.   2. If you chose to wash your hair, wash your hair first as usual with your normal shampoo.  3. After you shampoo, rinse your hair and body thoroughly to remove the shampoo.  4. Use CHG as you would any other liquid soap. You can apply CHG directly to the skin and wash gently with a scrungie or a clean washcloth.   5. Apply the CHG Soap to your body ONLY FROM THE NECK DOWN.  Do not use on open wounds or open sores. Avoid contact with your eyes, ears, mouth and genitals (private parts). Wash Face and genitals (private parts)  with your normal soap.  6. Wash thoroughly, paying special attention to the area where your surgery will be performed.  7. Thoroughly rinse your body with warm water from the neck down.  8. DO NOT shower/wash with your normal soap after using and rinsing off the CHG Soap.  9. Pat yourself dry with a CLEAN TOWEL.  10. Wear CLEAN PAJAMAS to bed the night before surgery, wear comfortable clothes the morning of surgery  11. Place CLEAN SHEETS on your bed the night of your first shower and DO NOT  SLEEP WITH PETS.    Day of Surgery: Do not apply any deodorants/lotions. Please wear clean clothes to the hospital/surgery center.      Please read over the following fact sheets that you were given. Coughing and Deep Breathing, MRSA Information and Surgical Site Infection Prevention

## 2017-07-07 ENCOUNTER — Encounter (HOSPITAL_COMMUNITY): Payer: Self-pay

## 2017-07-07 ENCOUNTER — Other Ambulatory Visit: Payer: Self-pay

## 2017-07-07 ENCOUNTER — Encounter (HOSPITAL_COMMUNITY)
Admission: RE | Admit: 2017-07-07 | Discharge: 2017-07-07 | Disposition: A | Discharge status: Home / Self Care | Payer: BLUE CROSS/BLUE SHIELD | Source: Ambulatory Visit | Attending: Neurosurgery | Admitting: Neurosurgery

## 2017-07-07 DIAGNOSIS — Z01812 Encounter for preprocedural laboratory examination: Secondary | ICD-10-CM | POA: Diagnosis not present

## 2017-07-07 HISTORY — DX: Headache, unspecified: R51.9

## 2017-07-07 HISTORY — DX: Cardiac arrhythmia, unspecified: I49.9

## 2017-07-07 HISTORY — DX: Other specified postprocedural states: Z98.890

## 2017-07-07 HISTORY — DX: Headache: R51

## 2017-07-07 HISTORY — DX: Other specified postprocedural states: R11.2

## 2017-07-07 LAB — TYPE AND SCREEN
ABO/RH(D): A POS
ANTIBODY SCREEN: NEGATIVE

## 2017-07-07 LAB — BASIC METABOLIC PANEL
Anion gap: 11 (ref 5–15)
BUN: 15 mg/dL (ref 6–20)
CHLORIDE: 100 mmol/L — AB (ref 101–111)
CO2: 26 mmol/L (ref 22–32)
Calcium: 9.4 mg/dL (ref 8.9–10.3)
Creatinine, Ser: 0.66 mg/dL (ref 0.44–1.00)
GFR calc non Af Amer: >60 mL/min (ref >60)
Glucose, Bld: 103 mg/dL — ABNORMAL HIGH (ref 65–99)
POTASSIUM: 3.9 mmol/L (ref 3.5–5.1)
SODIUM: 137 mmol/L (ref 135–145)

## 2017-07-07 LAB — CBC
HCT: 40.3 % (ref 36.0–46.0)
HEMOGLOBIN: 13.5 g/dL (ref 12.0–15.0)
MCH: 30.4 pg (ref 26.0–34.0)
MCHC: 33.5 g/dL (ref 30.0–36.0)
MCV: 90.8 fL (ref 78.0–100.0)
Platelets: 423 10*3/uL — ABNORMAL HIGH (ref 150–400)
RBC: 4.44 MIL/uL (ref 3.87–5.11)
RDW: 12.9 % (ref 11.5–15.5)
WBC: 9.7 10*3/uL (ref 4.0–10.5)

## 2017-07-07 LAB — ABO/RH: ABO/RH(D): A POS

## 2017-07-07 LAB — SURGICAL PCR SCREEN
MRSA, PCR: NEGATIVE
STAPHYLOCOCCUS AUREUS: POSITIVE — AB

## 2017-07-07 NOTE — Progress Notes (Signed)
PATIENT STATED SHE THOUGHT THE SURGEON WAS GOING IN ABDOMEN AND THEN FLIPPING OVER AND GOING THROUGH BACK.  ORDER WAS ONLY FOR ANTERIOR.  SPOKE WITH VANESSA AT OFFICE WHO WILL CONFIRM WITH DR. CRAM AND CALL PATIENT.

## 2017-07-08 NOTE — Progress Notes (Addendum)
Anesthesia Chart Review:   Case:  [id number] Date/Time:  07/14/17 0715   Procedures:      ALIF - L5-S1 with pedicle screws (N/A )     L5-S1 pedicle screws (N/A Back)     ABDOMINAL EXPOSURE (N/A )   Anesthesia type:  General   Pre-op diagnosis:  Spondylolisthesis   Location:  [facility] OR ROOM 18 / [facility] OR   Surgeon:  Donalee Citrin, MD; Larina Earthly, MD      Addendum 07/10/17:   Dr. Bing Matter does not feel pt needs to get event monitor or echo before surgery and has cleared pt for surgery.    DISCUSSION:  - Pt is a 42 year old female.  She saw cardiology 06/10/17 for palpitations; event monitor and echo ordered but have not yet been done.    - I spoke with Erie Noe from Dr. Lonie Peak office.  Pt will need testing done prior to surgery or permission from Dr. Bing Matter to proceed without testing.    VS: BP 133/84   Pulse 72   Temp 36.7 C   Resp 20   Ht  (1.575 m)   Wt 192 lb 4.8 oz (87.2 kg)   SpO2 100%   BMI 35.17 kg/m   PROVIDERS:  PCP is Bradd Canary, MD   Patient Care Team: Donalee Citrin, MD as Consulting Physician (Neurosurgery) Olivia Mackie, MD as Consulting Physician (Obstetrics and Gynecology)   [facility] cardiologist Gypsy Balsam, MD on 06/10/17 for palpitations; event monitor and echo ordered but have not yet been done.    LABS: Labs reviewed: Acceptable for surgery. (all labs ordered are listed, but only abnormal results are displayed)  Labs Reviewed  SURGICAL PCR SCREEN - Abnormal; Notable for the following components:      Result Value   Staphylococcus aureus POSITIVE (*)    All other components within normal limits  CBC - Abnormal; Notable for the following components:   Platelets 423 (*)    All other components within normal limits  BASIC METABOLIC PANEL - Abnormal; Notable for the following components:   Chloride 100 (*)    Glucose, Bld 103 (*)    All other components within normal limits  TYPE AND SCREEN  ABO/RH     EKG 05/30/17: Sinus  Rhythm. RSR(V1)  -nondiagnostic.   CV:  Echo and cardiac event monitor have been ordered but not yet done    Past Medical History:  Diagnosis Date  . Carpal tunnel syndrome 06/20/2014  . Dysrhythmia    PAPLITATIONS  . Essential hypertension 06/10/2017  . Headache    HX  MIGRAINES  . Hyperlipidemia, mixed 10/10/2014  . Obesity 10/10/2014  . PONV (postoperative nausea and vomiting)   . Preventative health care 09/27/2014   Sees Dr Arrie Aran for GYN  . Wart 10/10/2014    Past Surgical History:  Procedure Laterality Date  . CESAREAN SECTION     X2    MEDICATIONS: . aspirin-acetaminophen-caffeine (EXCEDRIN MIGRAINE) 250-250-65 MG tablet  . Biotin 5000 MCG TABS  . hydrochlorothiazide (MICROZIDE) 12.5 MG capsule  . losartan (COZAAR) 100 MG tablet  . norethindrone-ethinyl estradiol (MICROGESTIN,JUNEL,LOESTRIN) 1-20 MG-MCG tablet  . valACYclovir (VALTREX) 1000 MG tablet   No current facility-administered medications for this encounter.     If no changes, I anticipate pt can proceed with surgery as scheduled.   Rica Mast, FNP-BC [facility] Short Stay Surgical Center/Anesthesiology Phone: [phone] 07/10/2017 9:07 AM

## 2017-07-09 ENCOUNTER — Encounter: Payer: Self-pay | Admitting: *Deleted

## 2017-07-11 ENCOUNTER — Telehealth: Payer: Self-pay | Admitting: *Deleted

## 2017-07-11 NOTE — Telephone Encounter (Signed)
Patient called on Wednesday, 07/09/17, stating that she needed proof of cardiac clearance that day for her back surgery scheduled on 07/14/17 or they were going to cancel her surgery. Patient states she cancelled her monitor appointment and echocardiogram because she felt much better and was no longer having palpitations after completely cutting out caffeine. Discussed this with Dr. Bing Matter and he advised she was at an acceptable risk for surgery. Letter faxed to [facility]. Also spoke with Erie Noe who confirmed that they received the letter.

## 2017-07-14 ENCOUNTER — Encounter (HOSPITAL_COMMUNITY): Payer: Self-pay | Admitting: *Deleted

## 2017-07-14 ENCOUNTER — Inpatient Hospital Stay (HOSPITAL_COMMUNITY): Payer: BLUE CROSS/BLUE SHIELD | Admitting: Anesthesiology

## 2017-07-14 ENCOUNTER — Inpatient Hospital Stay (HOSPITAL_COMMUNITY): Payer: BLUE CROSS/BLUE SHIELD

## 2017-07-14 ENCOUNTER — Inpatient Hospital Stay (HOSPITAL_COMMUNITY)
Admission: RE | Admit: 2017-07-14 | Discharge: 2017-07-15 | DRG: 455 | Disposition: A | Discharge status: Home / Self Care | Payer: BLUE CROSS/BLUE SHIELD | Attending: Neurosurgery | Admitting: Neurosurgery

## 2017-07-14 ENCOUNTER — Inpatient Hospital Stay (HOSPITAL_COMMUNITY)
Admission: RE | Disposition: A | Discharge status: Home / Self Care | Payer: Self-pay | Source: Home / Self Care | Attending: Neurosurgery

## 2017-07-14 ENCOUNTER — Inpatient Hospital Stay (HOSPITAL_COMMUNITY): Payer: BLUE CROSS/BLUE SHIELD | Admitting: Emergency Medicine

## 2017-07-14 DIAGNOSIS — Z793 Long term (current) use of hormonal contraceptives: Secondary | ICD-10-CM

## 2017-07-14 DIAGNOSIS — I10 Essential (primary) hypertension: Secondary | ICD-10-CM | POA: Diagnosis present

## 2017-07-14 DIAGNOSIS — M4716 Other spondylosis with myelopathy, lumbar region: Secondary | ICD-10-CM | POA: Diagnosis not present

## 2017-07-14 DIAGNOSIS — Z79899 Other long term (current) drug therapy: Secondary | ICD-10-CM

## 2017-07-14 DIAGNOSIS — M4317 Spondylolisthesis, lumbosacral region: Secondary | ICD-10-CM

## 2017-07-14 DIAGNOSIS — M4807 Spinal stenosis, lumbosacral region: Secondary | ICD-10-CM | POA: Diagnosis not present

## 2017-07-14 DIAGNOSIS — R109 Unspecified abdominal pain: Secondary | ICD-10-CM | POA: Diagnosis not present

## 2017-07-14 DIAGNOSIS — G5600 Carpal tunnel syndrome, unspecified upper limb: Secondary | ICD-10-CM | POA: Diagnosis not present

## 2017-07-14 DIAGNOSIS — Z981 Arthrodesis status: Secondary | ICD-10-CM | POA: Diagnosis not present

## 2017-07-14 DIAGNOSIS — E782 Mixed hyperlipidemia: Secondary | ICD-10-CM | POA: Diagnosis not present

## 2017-07-14 DIAGNOSIS — Z419 Encounter for procedure for purposes other than remedying health state, unspecified: Secondary | ICD-10-CM

## 2017-07-14 HISTORY — PX: ABDOMINAL EXPOSURE: SHX5708

## 2017-07-14 HISTORY — DX: Spondylolisthesis, lumbosacral region: M43.17

## 2017-07-14 HISTORY — PX: ANTERIOR LUMBAR FUSION: SHX1170

## 2017-07-14 SURGERY — ANTERIOR LUMBAR FUSION 1 LEVEL
Anesthesia: General | Site: Back

## 2017-07-14 MED ORDER — VANCOMYCIN HCL 1000 MG IV SOLR
INTRAVENOUS | Status: AC
  Filled 2017-07-14: qty 1000

## 2017-07-14 MED ORDER — SODIUM CHLORIDE 0.9 % IV SOLN
250.0000 mL | INTRAVENOUS | Status: DC
  Administered 2017-07-14: 250 mL via INTRAVENOUS

## 2017-07-14 MED ORDER — LIDOCAINE 2% (20 MG/ML) 5 ML SYRINGE
INTRAMUSCULAR | Status: AC
  Filled 2017-07-14: qty 5

## 2017-07-14 MED ORDER — CEFAZOLIN SODIUM-DEXTROSE 2-4 GM/100ML-% IV SOLN
2.0000 g | INTRAVENOUS | Status: AC
  Administered 2017-07-14 (×2): 2 g via INTRAVENOUS

## 2017-07-14 MED ORDER — GLYCOPYRROLATE 0.2 MG/ML IV SOSY
PREFILLED_SYRINGE | INTRAVENOUS | Status: DC | PRN
  Administered 2017-07-14: .2 mg via INTRAVENOUS

## 2017-07-14 MED ORDER — 0.9 % SODIUM CHLORIDE (POUR BTL) OPTIME
TOPICAL | Status: DC | PRN
  Administered 2017-07-14: 1000 mL

## 2017-07-14 MED ORDER — PHENYLEPHRINE 40 MCG/ML (10ML) SYRINGE FOR IV PUSH (FOR BLOOD PRESSURE SUPPORT)
PREFILLED_SYRINGE | INTRAVENOUS | Status: AC
  Filled 2017-07-14: qty 10

## 2017-07-14 MED ORDER — SUCCINYLCHOLINE CHLORIDE 200 MG/10ML IV SOSY
PREFILLED_SYRINGE | INTRAVENOUS | Status: AC
  Filled 2017-07-14: qty 10

## 2017-07-14 MED ORDER — HYDROCHLOROTHIAZIDE 12.5 MG PO CAPS: 12.5000 mg | ORAL_CAPSULE | Freq: Every day | ORAL | Status: DC

## 2017-07-14 MED ORDER — SUGAMMADEX SODIUM 200 MG/2ML IV SOLN
INTRAVENOUS | Status: AC
  Filled 2017-07-14: qty 2

## 2017-07-14 MED ORDER — MIDAZOLAM HCL 2 MG/2ML IJ SOLN
INTRAMUSCULAR | Status: AC
  Filled 2017-07-14: qty 2

## 2017-07-14 MED ORDER — MIDAZOLAM HCL 5 MG/5ML IJ SOLN
INTRAMUSCULAR | Status: DC | PRN
  Administered 2017-07-14 (×2): 2 mg via INTRAVENOUS

## 2017-07-14 MED ORDER — MENTHOL 3 MG MT LOZG: 1.0000 | LOZENGE | OROMUCOSAL | Status: DC | PRN

## 2017-07-14 MED ORDER — ACETAMINOPHEN 650 MG RE SUPP: 650.0000 mg | RECTAL | Status: DC | PRN

## 2017-07-14 MED ORDER — ROCURONIUM BROMIDE 50 MG/5ML IV SOLN
INTRAVENOUS | Status: AC
  Filled 2017-07-14: qty 1

## 2017-07-14 MED ORDER — CYCLOBENZAPRINE HCL 10 MG PO TABS
ORAL_TABLET | ORAL | Status: AC
  Administered 2017-07-14: 10 mg via ORAL
  Filled 2017-07-14: qty 1

## 2017-07-14 MED ORDER — SURGIFOAM 100 EX MISC
CUTANEOUS | Status: DC | PRN
  Administered 2017-07-14: 09:00:00 via TOPICAL

## 2017-07-14 MED ORDER — LACTATED RINGERS IV SOLN
INTRAVENOUS | Status: DC | PRN
  Administered 2017-07-14 (×3): via INTRAVENOUS

## 2017-07-14 MED ORDER — SUGAMMADEX SODIUM 200 MG/2ML IV SOLN
INTRAVENOUS | Status: DC | PRN
  Administered 2017-07-14: 174.2 mg via INTRAVENOUS

## 2017-07-14 MED ORDER — LACTATED RINGERS IV SOLN
INTRAVENOUS | Status: DC | PRN
  Administered 2017-07-14 (×2): via INTRAVENOUS

## 2017-07-14 MED ORDER — ONDANSETRON HCL 4 MG PO TABS: 4.0000 mg | ORAL_TABLET | Freq: Four times a day (QID) | ORAL | Status: DC | PRN

## 2017-07-14 MED ORDER — NORETHINDRONE ACET-ETHINYL EST 1-20 MG-MCG PO TABS: 1.0000 | ORAL_TABLET | Freq: Every day | ORAL | Status: DC

## 2017-07-14 MED ORDER — ALUM & MAG HYDROXIDE-SIMETH 200-200-20 MG/5ML PO SUSP: 30.0000 mL | Freq: Four times a day (QID) | ORAL | Status: DC | PRN

## 2017-07-14 MED ORDER — FENTANYL CITRATE (PF) 250 MCG/5ML IJ SOLN
INTRAMUSCULAR | Status: AC
  Filled 2017-07-14: qty 5

## 2017-07-14 MED ORDER — ACETAMINOPHEN 10 MG/ML IV SOLN
INTRAVENOUS | Status: DC | PRN
  Administered 2017-07-14: 1000 mg via INTRAVENOUS

## 2017-07-14 MED ORDER — THROMBIN 20000 UNITS EX SOLR
CUTANEOUS | Status: AC
  Filled 2017-07-14: qty 20000

## 2017-07-14 MED ORDER — LIDOCAINE-EPINEPHRINE 1 %-1:100000 IJ SOLN
INTRAMUSCULAR | Status: DC | PRN
  Administered 2017-07-14: 10 mL

## 2017-07-14 MED ORDER — OXYCODONE HCL 5 MG PO TABS
10.0000 mg | ORAL_TABLET | ORAL | Status: DC | PRN
  Administered 2017-07-14 – 2017-07-15 (×7): 10 mg via ORAL
  Filled 2017-07-14 (×7): qty 2

## 2017-07-14 MED ORDER — CHLORHEXIDINE GLUCONATE 4 % EX LIQD: 60.0000 mL | Freq: Once | CUTANEOUS | Status: DC

## 2017-07-14 MED ORDER — ONDANSETRON HCL 4 MG/2ML IJ SOLN
INTRAMUSCULAR | Status: DC | PRN
  Administered 2017-07-14: 4 mg via INTRAVENOUS

## 2017-07-14 MED ORDER — LIDOCAINE-EPINEPHRINE 1 %-1:100000 IJ SOLN
INTRAMUSCULAR | Status: AC
  Filled 2017-07-14: qty 1

## 2017-07-14 MED ORDER — OXYCODONE HCL 5 MG PO TABS
ORAL_TABLET | ORAL | Status: AC
  Administered 2017-07-14: 10 mg via ORAL
  Filled 2017-07-14: qty 2

## 2017-07-14 MED ORDER — CHLORHEXIDINE GLUCONATE CLOTH 2 % EX PADS: 6.0000 | MEDICATED_PAD | Freq: Once | CUTANEOUS | Status: DC

## 2017-07-14 MED ORDER — ARTIFICIAL TEARS OPHTHALMIC OINT
TOPICAL_OINTMENT | OPHTHALMIC | Status: AC
  Filled 2017-07-14: qty 3.5

## 2017-07-14 MED ORDER — PHENYLEPHRINE HCL 10 MG/ML IJ SOLN
INTRAMUSCULAR | Status: DC | PRN
  Administered 2017-07-14: 20 ug/min via INTRAVENOUS

## 2017-07-14 MED ORDER — PROPOFOL 10 MG/ML IV BOLUS
INTRAVENOUS | Status: AC
  Filled 2017-07-14: qty 20

## 2017-07-14 MED ORDER — CEFAZOLIN SODIUM-DEXTROSE 2-4 GM/100ML-% IV SOLN
2.0000 g | Freq: Three times a day (TID) | INTRAVENOUS | Status: DC
Start: 2017-07-14 — End: 2017-07-15
  Administered 2017-07-14 – 2017-07-15 (×2): 2 g via INTRAVENOUS
  Filled 2017-07-14 (×2): qty 100

## 2017-07-14 MED ORDER — SODIUM CHLORIDE 0.9 % IV SOLN
INTRAVENOUS | Status: DC | PRN
  Administered 2017-07-14 (×2)

## 2017-07-14 MED ORDER — ACETAMINOPHEN 10 MG/ML IV SOLN
INTRAVENOUS | Status: AC
  Filled 2017-07-14: qty 100

## 2017-07-14 MED ORDER — PROPOFOL 10 MG/ML IV BOLUS
INTRAVENOUS | Status: DC | PRN
  Administered 2017-07-14: 120 mg via INTRAVENOUS
  Administered 2017-07-14: 80 mg via INTRAVENOUS

## 2017-07-14 MED ORDER — MEPERIDINE HCL 50 MG/ML IJ SOLN: 6.2500 mg | INTRAMUSCULAR | Status: DC | PRN

## 2017-07-14 MED ORDER — ONDANSETRON HCL 4 MG/2ML IJ SOLN: 4.0000 mg | Freq: Four times a day (QID) | INTRAMUSCULAR | Status: DC | PRN

## 2017-07-14 MED ORDER — ASPIRIN-ACETAMINOPHEN-CAFFEINE 250-250-65 MG PO TABS
2.0000 | ORAL_TABLET | Freq: Two times a day (BID) | ORAL | Status: DC | PRN
  Filled 2017-07-14: qty 2

## 2017-07-14 MED ORDER — SODIUM CHLORIDE 0.9% FLUSH
3.0000 mL | Freq: Two times a day (BID) | INTRAVENOUS | Status: DC
  Administered 2017-07-14 (×2): 3 mL via INTRAVENOUS

## 2017-07-14 MED ORDER — DEXAMETHASONE SODIUM PHOSPHATE 10 MG/ML IJ SOLN
INTRAMUSCULAR | Status: AC
Start: 2017-07-14 — End: 2017-07-14
  Filled 2017-07-14: qty 1

## 2017-07-14 MED ORDER — PHENOL 1.4 % MT LIQD: 1.0000 | OROMUCOSAL | Status: DC | PRN

## 2017-07-14 MED ORDER — MAGNESIUM CITRATE PO SOLN
1.0000 | Freq: Once | ORAL | Status: AC
  Administered 2017-07-14: 1 via ORAL
  Filled 2017-07-14: qty 296

## 2017-07-14 MED ORDER — DEXAMETHASONE SODIUM PHOSPHATE 10 MG/ML IJ SOLN
INTRAMUSCULAR | Status: AC
  Filled 2017-07-14: qty 1

## 2017-07-14 MED ORDER — ROCURONIUM BROMIDE 10 MG/ML (PF) SYRINGE
PREFILLED_SYRINGE | INTRAVENOUS | Status: DC | PRN
  Administered 2017-07-14 (×2): 50 mg via INTRAVENOUS
  Administered 2017-07-14: 30 mg via INTRAVENOUS
  Administered 2017-07-14: 20 mg via INTRAVENOUS

## 2017-07-14 MED ORDER — VANCOMYCIN HCL 1 G IV SOLR
INTRAVENOUS | Status: DC | PRN
  Administered 2017-07-14: 1000 mg via TOPICAL

## 2017-07-14 MED ORDER — THROMBIN 5000 UNITS EX SOLR
CUTANEOUS | Status: AC
  Filled 2017-07-14: qty 5000

## 2017-07-14 MED ORDER — CEFAZOLIN SODIUM-DEXTROSE 2-4 GM/100ML-% IV SOLN
INTRAVENOUS | Status: AC
  Filled 2017-07-14: qty 100

## 2017-07-14 MED ORDER — ACETAMINOPHEN 325 MG PO TABS
650.0000 mg | ORAL_TABLET | ORAL | Status: DC | PRN
  Filled 2017-07-14: qty 2

## 2017-07-14 MED ORDER — CYCLOBENZAPRINE HCL 10 MG PO TABS
10.0000 mg | ORAL_TABLET | Freq: Three times a day (TID) | ORAL | Status: DC | PRN
  Administered 2017-07-14 – 2017-07-15 (×3): 10 mg via ORAL
  Filled 2017-07-14 (×3): qty 1

## 2017-07-14 MED ORDER — BIOTIN 5000 MCG PO TABS: 5000.0000 ug | ORAL_TABLET | Freq: Every day | ORAL | Status: DC

## 2017-07-14 MED ORDER — HYDROMORPHONE HCL 1 MG/ML IJ SOLN
1.0000 mg | INTRAMUSCULAR | Status: DC | PRN
  Administered 2017-07-15: 1 mg via INTRAVENOUS
  Filled 2017-07-14: qty 1

## 2017-07-14 MED ORDER — CEFAZOLIN SODIUM 1 G IJ SOLR
INTRAMUSCULAR | Status: AC
  Filled 2017-07-14: qty 20

## 2017-07-14 MED ORDER — LOSARTAN POTASSIUM 50 MG PO TABS: 100.0000 mg | ORAL_TABLET | Freq: Every day | ORAL | Status: DC

## 2017-07-14 MED ORDER — DEXAMETHASONE SODIUM PHOSPHATE 10 MG/ML IJ SOLN
10.0000 mg | INTRAMUSCULAR | Status: AC
  Administered 2017-07-14: 10 mg via INTRAVENOUS

## 2017-07-14 MED ORDER — HYDROMORPHONE HCL 2 MG/ML IJ SOLN: 0.2500 mg | INTRAMUSCULAR | Status: DC | PRN

## 2017-07-14 MED ORDER — ONDANSETRON HCL 4 MG/2ML IJ SOLN: 4.0000 mg | Freq: Once | INTRAMUSCULAR | Status: DC | PRN

## 2017-07-14 MED ORDER — FENTANYL CITRATE (PF) 250 MCG/5ML IJ SOLN
INTRAMUSCULAR | Status: DC | PRN
  Administered 2017-07-14: 100 ug via INTRAVENOUS
  Administered 2017-07-14: 50 ug via INTRAVENOUS
  Administered 2017-07-14: 25 ug via INTRAVENOUS
  Administered 2017-07-14: 50 ug via INTRAVENOUS
  Administered 2017-07-14: 100 ug via INTRAVENOUS
  Administered 2017-07-14: 50 ug via INTRAVENOUS
  Administered 2017-07-14: 25 ug via INTRAVENOUS
  Administered 2017-07-14 (×2): 50 ug via INTRAVENOUS

## 2017-07-14 MED ORDER — PANTOPRAZOLE SODIUM 40 MG PO TBEC
40.0000 mg | DELAYED_RELEASE_TABLET | Freq: Two times a day (BID) | ORAL | Status: DC
  Administered 2017-07-14 – 2017-07-15 (×2): 40 mg via ORAL
  Filled 2017-07-14 (×2): qty 1

## 2017-07-14 MED ORDER — SODIUM CHLORIDE 0.9% FLUSH: 3.0000 mL | INTRAVENOUS | Status: DC | PRN

## 2017-07-14 MED ORDER — BUPIVACAINE LIPOSOME 1.3 % IJ SUSP
20.0000 mL | INTRAMUSCULAR | Status: AC
  Administered 2017-07-14: 20 mL
  Filled 2017-07-14: qty 20

## 2017-07-14 MED ORDER — ONDANSETRON HCL 4 MG/2ML IJ SOLN
INTRAMUSCULAR | Status: AC
  Filled 2017-07-14: qty 2

## 2017-07-14 MED ORDER — LIDOCAINE 2% (20 MG/ML) 5 ML SYRINGE
INTRAMUSCULAR | Status: DC | PRN
  Administered 2017-07-14: 60 mg via INTRAVENOUS

## 2017-07-14 SURGICAL SUPPLY — 109 items
ANCHOR LUMBAR 25 MIS (Anchor) ×9 IMPLANT
APPLIER CLIP 11 MED OPEN (CLIP) ×3
BAG DECANTER FOR FLEXI CONT (MISCELLANEOUS) ×3 IMPLANT
BENZOIN TINCTURE PRP APPL 2/3 (GAUZE/BANDAGES/DRESSINGS) ×3 IMPLANT
BLADE CLIPPER SURG (BLADE) ×3 IMPLANT
BLADE SURG 11 STRL SS (BLADE) ×3 IMPLANT
BONE MATRIX OSTEOCEL PRO MED (Bone Implant) ×3 IMPLANT
BUR BARREL STRAIGHT FLUTE 4.0 (BURR) ×3 IMPLANT
BUR CUTTER 7.0 ROUND (BURR) IMPLANT
BUR MATCHSTICK NEURO 3.0 LAGG (BURR) IMPLANT
CANISTER SUCT 3000ML PPV (MISCELLANEOUS) ×3 IMPLANT
CAP LOCKING THREADED (Cap) ×12 IMPLANT
CARTRIDGE OIL MAESTRO DRILL (MISCELLANEOUS) IMPLANT
CLIP APPLIE 11 MED OPEN (CLIP) ×2 IMPLANT
CLIP LIGATING EXTRA MED SLVR (CLIP) IMPLANT
CLIP LIGATING EXTRA SM BLUE (MISCELLANEOUS) IMPLANT
CONT SPEC 4OZ CLIKSEAL STRL BL (MISCELLANEOUS) ×3 IMPLANT
COVER BACK TABLE 60X90IN (DRAPES) IMPLANT
DECANTER SPIKE VIAL GLASS SM (MISCELLANEOUS) ×3 IMPLANT
DERMABOND ADVANCED (GAUZE/BANDAGES/DRESSINGS) ×2
DERMABOND ADVANCED .7 DNX12 (GAUZE/BANDAGES/DRESSINGS) ×4 IMPLANT
DIFFUSER DRILL AIR PNEUMATIC (MISCELLANEOUS) IMPLANT
DRAPE C-ARM 42X72 X-RAY (DRAPES) ×6 IMPLANT
DRAPE C-ARMOR (DRAPES) ×6 IMPLANT
DRAPE HALF SHEET 40X57 (DRAPES) ×3 IMPLANT
DRAPE LAPAROTOMY 100X72X124 (DRAPES) ×6 IMPLANT
DRAPE SURG 17X23 STRL (DRAPES) ×3 IMPLANT
DRSG OPSITE 4X5.5 SM (GAUZE/BANDAGES/DRESSINGS) ×3 IMPLANT
DRSG OPSITE POSTOP 4X6 (GAUZE/BANDAGES/DRESSINGS) ×3 IMPLANT
DRSG OPSITE POSTOP 4X8 (GAUZE/BANDAGES/DRESSINGS) ×3 IMPLANT
DURAPREP 26ML APPLICATOR (WOUND CARE) ×6 IMPLANT
ELECT BLADE 4.0 EZ CLEAN MEGAD (MISCELLANEOUS) ×3
ELECT BLADE 6.5 EXT (BLADE) IMPLANT
ELECT REM PT RETURN 9FT ADLT (ELECTROSURGICAL) ×6
ELECTRODE BLDE 4.0 EZ CLN MEGD (MISCELLANEOUS) ×2 IMPLANT
ELECTRODE REM PT RTRN 9FT ADLT (ELECTROSURGICAL) ×4 IMPLANT
EVACUATOR 3/16  PVC DRAIN (DRAIN) ×1
EVACUATOR 3/16 PVC DRAIN (DRAIN) ×2 IMPLANT
FIBER BOAT ALLOFUSE 7.5CC (Bone Implant) ×3 IMPLANT
GAUZE SPONGE 4X4 12PLY STRL (GAUZE/BANDAGES/DRESSINGS) ×3 IMPLANT
GAUZE SPONGE 4X4 16PLY XRAY LF (GAUZE/BANDAGES/DRESSINGS) IMPLANT
GLOVE BIO SURGEON STRL SZ7 (GLOVE) IMPLANT
GLOVE BIO SURGEON STRL SZ8 (GLOVE) ×6 IMPLANT
GLOVE BIOGEL PI IND STRL 7.0 (GLOVE) ×4 IMPLANT
GLOVE BIOGEL PI IND STRL 7.5 (GLOVE) ×4 IMPLANT
GLOVE BIOGEL PI INDICATOR 7.0 (GLOVE) ×2
GLOVE BIOGEL PI INDICATOR 7.5 (GLOVE) ×2
GLOVE ECLIPSE 7.5 STRL STRAW (GLOVE) IMPLANT
GLOVE EXAM NITRILE LRG STRL (GLOVE) IMPLANT
GLOVE EXAM NITRILE XL STR (GLOVE) IMPLANT
GLOVE EXAM NITRILE XS STR PU (GLOVE) IMPLANT
GLOVE INDICATOR 8.5 STRL (GLOVE) ×6 IMPLANT
GLOVE SS BIOGEL STRL SZ 7.5 (GLOVE) ×4 IMPLANT
GLOVE SUPERSENSE BIOGEL SZ 7.5 (GLOVE) ×2
GLOVE SURG SS PI 7.0 STRL IVOR (GLOVE) ×12 IMPLANT
GOWN STRL REUS W/ TWL LRG LVL3 (GOWN DISPOSABLE) IMPLANT
GOWN STRL REUS W/ TWL XL LVL3 (GOWN DISPOSABLE) ×8 IMPLANT
GOWN STRL REUS W/TWL 2XL LVL3 (GOWN DISPOSABLE) IMPLANT
GOWN STRL REUS W/TWL LRG LVL3 (GOWN DISPOSABLE)
GOWN STRL REUS W/TWL XL LVL3 (GOWN DISPOSABLE) ×4
HEMOSTAT POWDER KIT SURGIFOAM (HEMOSTASIS) IMPLANT
INSERT FOGARTY 61MM (MISCELLANEOUS) IMPLANT
INSERT FOGARTY SM (MISCELLANEOUS) IMPLANT
KIT BASIN OR (CUSTOM PROCEDURE TRAY) ×3 IMPLANT
KIT TURNOVER KIT B (KITS) ×3 IMPLANT
LOOP VESSEL MAXI BLUE (MISCELLANEOUS) IMPLANT
LOOP VESSEL MINI RED (MISCELLANEOUS) IMPLANT
MILL MEDIUM DISP (BLADE) IMPLANT
NEEDLE HYPO 25X1 1.5 SAFETY (NEEDLE) ×3 IMPLANT
NEEDLE SPNL 18GX3.5 QUINCKE PK (NEEDLE) ×3 IMPLANT
NS IRRIG 1000ML POUR BTL (IV SOLUTION) ×3 IMPLANT
OIL CARTRIDGE MAESTRO DRILL (MISCELLANEOUS)
PACK LAMINECTOMY NEURO (CUSTOM PROCEDURE TRAY) ×6 IMPLANT
PAD ARMBOARD 7.5X6 YLW CONV (MISCELLANEOUS) ×9 IMPLANT
PUTTY DBM ALLOFUSE 2.5CC (Bone Implant) ×3 IMPLANT
ROD 40MM SPINAL (Rod) ×6 IMPLANT
SCREW CREO 5.5X40 (Screw) ×6 IMPLANT
SCREW CREO AMP 5.5X35 (Screw) ×6 IMPLANT
SCREW PA THRD CREO TULIP 5.5X4 (Head) ×12 IMPLANT
SPACER INDEPEND 29X39MM 15D 15 (Spacer) ×3 IMPLANT
SPONGE INTESTINAL PEANUT (DISPOSABLE) ×6 IMPLANT
SPONGE LAP 18X18 X RAY DECT (DISPOSABLE) ×3 IMPLANT
SPONGE LAP 4X18 X RAY DECT (DISPOSABLE) IMPLANT
SPONGE SURGIFOAM ABS GEL 100 (HEMOSTASIS) ×3 IMPLANT
STAPLER VISISTAT 35W (STAPLE) IMPLANT
STRIP CLOSURE SKIN 1/2X4 (GAUZE/BANDAGES/DRESSINGS) ×3 IMPLANT
SUT PDS AB 1 CTX 36 (SUTURE) IMPLANT
SUT PROLENE 4 0 RB 1 (SUTURE)
SUT PROLENE 4-0 RB1 .5 CRCL 36 (SUTURE) IMPLANT
SUT PROLENE 5 0 CC1 (SUTURE) IMPLANT
SUT PROLENE 6 0 C 1 30 (SUTURE) IMPLANT
SUT PROLENE 6 0 CC (SUTURE) IMPLANT
SUT SILK 0 TIES 10X30 (SUTURE) IMPLANT
SUT SILK 2 0 TIES 10X30 (SUTURE) ×3 IMPLANT
SUT SILK 2 0SH CR/8 30 (SUTURE) IMPLANT
SUT SILK 3 0 SH CR/8 (SUTURE) IMPLANT
SUT SILK 3 0 TIES 10X30 (SUTURE) IMPLANT
SUT SILK 3 0SH CR/8 30 (SUTURE) IMPLANT
SUT VIC AB 0 CT1 18XCR BRD8 (SUTURE) ×2 IMPLANT
SUT VIC AB 0 CT1 27 (SUTURE)
SUT VIC AB 0 CT1 27XBRD ANBCTR (SUTURE) IMPLANT
SUT VIC AB 0 CT1 27XBRD ANTBC (SUTURE) IMPLANT
SUT VIC AB 0 CT1 8-18 (SUTURE) ×1
SUT VIC AB 2-0 CT1 18 (SUTURE) ×9 IMPLANT
SUT VIC AB 4-0 PS2 27 (SUTURE) ×6 IMPLANT
TOWEL GREEN STERILE (TOWEL DISPOSABLE) ×3 IMPLANT
TOWEL GREEN STERILE FF (TOWEL DISPOSABLE) ×3 IMPLANT
TRAY FOLEY MTR SLVR 16FR STAT (SET/KITS/TRAYS/PACK) ×3 IMPLANT
WATER STERILE IRR 1000ML POUR (IV SOLUTION) ×3 IMPLANT

## 2017-07-14 NOTE — Anesthesia Procedure Notes (Signed)
Arterial Line Insertion Start/End5/08/2017 7:14 AM, 07/14/2017 7:17 AM Performed by: CRNA  Patient location: Pre-op. Preanesthetic checklist: patient identified, IV checked, site marked, risks and benefits discussed, surgical consent, monitors and equipment checked, pre-op evaluation, timeout performed and anesthesia consent Lidocaine 1% used for infiltration and patient sedated Right, radial was placed Catheter size: 20 G Hand hygiene performed , maximum sterile barriers used  and Seldinger technique used Allen's test indicative of satisfactory collateral circulation Attempts: 1 Procedure performed without using ultrasound guided technique. Following insertion, dressing applied and Biopatch. Post procedure assessment: normal  Patient tolerated the procedure well with no immediate complications.

## 2017-07-14 NOTE — Op Note (Signed)
Preoperative diagnosis: Grade 1 spondylolisthesis L5-S1  Postoperative diagnosis: Same  Procedure: #1 anterior lumbar interbody fusion utilizing the globus integrated interbody cage with 25 mm Shanks packed withOsteocel pro  #2 through separate skin incision after patient was repositioned posterior segmental fixation with pedicle screws L5-S1 utilizing the globus creole modular pedicle screw set with foraminotomies of the right L5 nerve root  #3 posterior lateral arthrodesis L5-S1 utilizing allostem putty autograft and cortical fibers.  #4 (reduction spinal deformity  Surgeon: Jillyn Hidden Azusena Erlandson  Co-surgeon for ALIF:Dr. Tawanna Cooler early  Asst. For the posterior pedicle screw placement: Dr. Sherilyn Cooter pool  Anesthesia: Gen.  EBL: Minimal  History of present illness: 42 year old female is a long-standing back and right greater left leg pain with documented grade 1 spondylolisthesis with movement on flexion extension films. She's been having this and doing whatever several years and ultimately became refractory to all forms of conservative treatment. So recommended anterior lumbar interbody fusion and posterior augmentation with pedicle screws with foraminotomy the right L5 nerve root. I extensively went over the risks and benefits of that operation with her as well as perioperative course expectations of outcome and alternatives surgery and she understands and agrees to proceed forward.  Operative procedure: Patient brought into the or was induced on general anesthesia positioned supine her left lower quadrant of her abdomen was prepped and draped in routine sterile fashion the exposure will be dictated by Dr. Tawanna Cooler early. After adequate exposure been achieved the fluoroscopy confirmed the level both AP and lateral dimensions were identified and this was a size the spaces cleanout pituitary rongeurs and curettes and rasps. I then identified the posterior annulus and under bitten both endplates to gain access to the  ventral epidural space. There was a lot of extensive disc material especially on the right this is all removed and after adequate discectomy and achieved. The endplates and sized up to a 15 mm 15 implant packed with the ostia cell and then inserted under fluoroscopy. I then engaged and deployed the 3-25 mm [place] anchor the cage in place. Fluoroscopy was used the step along the way to confirm position this significantly reduced the deformity and opened up the disc space. Then the wound scope was irrigated meticulous in space was maintainedand the wounds closed in layers with interrupted Vicryl and a running 4 subcuticular. Patient was then repositioned prone for the posterior for the procedure.  Her back was shaved prepped and draped in routine sterile fashion after infiltration of 10 mL lidocaine with epi a midline incision made and Bovie light cautery was used to calcification subperiosteal dissections care lamina of L4-5 and S1 exposing TPs at L5 and S1 confirmed by intraoperative x-ray. DOS spinal laminar comp looks was noted be hypermobile I drilled down the right-sided laminar complexes and removed a very large spur coming off the medial facet complex super aspect right lamina causing compression of the right L5 nerve root. This allowed direct visualization of the right L5 nerve root in the right L5 pedicle. Then utilizing AP and lateral fluoroscopy pilot holes were drilled pedicles were cannulated probed tapped with a 45 Probed again and 5 5 x 40 screws were inserted L5 bilaterally and 5 5 x 35 S1. After all the implants are placed posterior fluoroscopy confirmed good position. I then aggressively decorticated the lateral facet complexes and TPs and packed the local autograft mix posterior laterally assembled the heads advanced a little bit anchored into 40 mm rods and tightened up being down. Reexplored the foramina  of L5 on the right this was widely patent. Gelfoam was laid up the dura and the muscle  proximal fascia reapproximated with after Vicryl's. I did inject X Braun the fascia and Spenco vancomycin in the wound and placed a medium Hemovac drain. Subcuticular was used to close the skin and the wound was dressed and patient recovered in stable position. Again the case on it counts sponge counts were correct.

## 2017-07-14 NOTE — Anesthesia Procedure Notes (Signed)
Procedure Name: Intubation Date/Time: 07/14/2017 7:38 AM Performed by: Adria Dill, CRNA Pre-anesthesia Checklist: Patient identified, Emergency Drugs available, Suction available and Patient being monitored Patient Re-evaluated:Patient Re-evaluated prior to induction Oxygen Delivery Method: Circle system utilized Preoxygenation: Pre-oxygenation with 100% oxygen Induction Type: IV induction Ventilation: Mask ventilation without difficulty Laryngoscope Size: Miller and 3 Grade View: Grade I Tube type: Oral Tube size: 7.0 mm Number of attempts: 1 Airway Equipment and Method: Stylet Placement Confirmation: ETT inserted through vocal cords under direct vision,  positive ETCO2,  CO2 detector and breath sounds checked- equal and bilateral Secured at: 21 cm Tube secured with: Tape Dental Injury: Teeth and Oropharynx as per pre-operative assessment

## 2017-07-14 NOTE — Op Note (Signed)
    OPERATIVE REPORT  DATE OF SURGERY: 07/14/2017  PATIENT: Tammy Brennan, 42 y.o. female MRN: [id number]  DOB: 1976-02-01  PRE-OPERATIVE DIAGNOSIS: Degenerative disc disease  POST-OPERATIVE DIAGNOSIS:  Same  PROCEDURE: Anterior exposure for L5-S1 fusion  SURGEON:  Gretta Began, M.D.  Co-surgeon for the exposure Dr. Donalee Citrin  ANESTHESIA: General  EBL: 400 ml  Total I/O In: 2000 [I.V.:2000] Out: 510 [Urine:110; Blood:400]  BLOOD ADMINISTERED: None  DRAINS: None  SPECIMEN: None  COUNTS CORRECT:  YES  PLAN OF CARE: PACU  PATIENT DISPOSITION:  PACU - hemodynamically stable  PROCEDURE DETAILS: The patient was taken to the operating placed supine position where the area of the abdomen was prepped and draped in the usual sterile fashion.  Crosstable lateral C-arm projection was used to locate the level of the L5-S1 disc.  The patient had a Pfannenstiel incision from prior cesarean section and this was at the level of the exposure.  Incision was made from the midline to the left through the old scar.  This was carried down through the skin with subcutaneous fat with electrocautery.  The anterior rectus sheath was opened with electrocautery and the rectus muscle was mobilized to the right.  The retroperitoneal space was entered bluntly and the posterior rectus sheath was opened laterally.  Blunt dissection was used to mobilize the intraperitoneal contents and left ureter to the right.  Dissection was continued above the level of the psoas muscle.  The L5-S1 disc was approached between the level of the iliac vessels.  The middle sacral vessels were clipped and divided.  Blunt dissection was continued to the right and left of the disc and also for inferior and superior exposure.  The Thompson retractor was brought onto the field.  The reverse lip 150 blade was positioned to the right and left of the L5-S1 disc.  Malleable retractors were used for superior and inferior exposure.  Spinal  needle was placed in the L5-S1 disc and C-arm was brought back onto the field and this was used to confirm this was the appropriate level.  The remainder of the procedure will be dictated as a separate note by Dr. Sanjuan Dame, M.D., [facility] 07/14/2017 11:35 AM

## 2017-07-14 NOTE — H&P (Signed)
Tammy Brennan is an 42 y.o. female.   Chief Complaint: back and right leg pain HPI: 42 year old female with long-standing back and right greater than left leg pain. Workup revealed a grade 1 spondylolisthesis with severe foraminal stenosis of the L5 and S1 nerve roots primarily on the right with an extra foraminal disc herniation at that level. Due to patient's failure conservative treatment imaging findings and progression of clinical syndrome I recommended anterior lumbar interbody fusion L5-S1 with posterior augmentation pedicle screws as well as possible foraminotomies of the right L5 nerve root. I extensively gone over the risks and benefits of the procedure with her as well as perioperative course expectations of outcome and alternatives of surgery she understands and agrees to proceed forward.  Past Medical History:  Diagnosis Date  . Carpal tunnel syndrome 06/20/2014  . Dysrhythmia    PAPLITATIONS  . Essential hypertension 06/10/2017  . Headache    HX  MIGRAINES  . Hyperlipidemia, mixed 10/10/2014  . Obesity 10/10/2014  . PONV (postoperative nausea and vomiting)   . Preventative health care 09/27/2014   Sees Dr Arrie Aran for GYN  . Wart 10/10/2014    Past Surgical History:  Procedure Laterality Date  . CESAREAN SECTION     X2    Family History  Problem Relation Age of Onset  . Hypertension Mother   . Hypertension Father   . Cancer Father        lung, previous light smoker  . Alcohol abuse Maternal Grandfather   . Heart disease Maternal Grandfather        MI  . Dementia Paternal Grandfather   . Hypertension Sister    Social History:  reports that she has never smoked. She has never used smokeless tobacco. She reports that she does not drink alcohol or use drugs.  Allergies: No Known Allergies  Medications Prior to Admission  Medication Sig Dispense Refill  . aspirin-acetaminophen-caffeine (EXCEDRIN MIGRAINE) 250-250-65 MG tablet Take 2 tablets by mouth 2 (two) times  daily as needed for headache.    . Biotin 5000 MCG TABS Take 5,000 mcg by mouth daily.    . hydrochlorothiazide (MICROZIDE) 12.5 MG capsule Take 1 capsule (12.5 mg total) by mouth daily. 30 capsule 3  . losartan (COZAAR) 100 MG tablet TAKE 1 TABLET(100 MG) BY MOUTH DAILY 30 tablet 0  . norethindrone-ethinyl estradiol (MICROGESTIN,JUNEL,LOESTRIN) 1-20 MG-MCG tablet Take 1 tablet by mouth daily.    . valACYclovir (VALTREX) 1000 MG tablet Take 2 g by mouth 2 (two) times daily as needed. Cold sores.  6    No results found for this or any previous visit (from the past 48 hour(s)). No results found.  Review of Systems  Musculoskeletal: Positive for back pain.  Neurological: Positive for tingling and sensory change.    Blood pressure (!) 153/91, pulse 90, temperature 97.9 F (36.6 C), temperature source Oral, resp. rate 20, height  (1.575 m), weight 87.1 kg (192 lb), SpO2 100 %. Physical Exam  Constitutional: She is oriented to person, place, and time. She appears well-developed.  HENT:  Head: Normocephalic.  Eyes: Pupils are equal, round, and reactive to light.  Neck: Normal range of motion.  GI: Soft. Bowel sounds are normal.  Musculoskeletal: Normal range of motion.  Neurological: She is alert and oriented to person, place, and time. She has normal strength.  Strength 5 out of 5 iliopsoas, quads, hip she's, gastric, into tibialis, and EHL.  Skin: Skin is warm and dry.  Assessment/Plan 42 year old female presents for an anterior lumbar interbody fusion L5-S1 with posterior augmentation with pedicle screws  Kauri Garson P, MD 07/14/2017, 7:24 AM

## 2017-07-14 NOTE — Anesthesia Preprocedure Evaluation (Addendum)
Anesthesia Evaluation  Patient identified by MRN, date of birth, ID band Patient awake    Reviewed: Allergy & Precautions, NPO status , Patient's Chart, lab work & pertinent test results  History of Anesthesia Complications (+) PONV  Airway Mallampati: II  TM Distance: >3 FB     Dental   Pulmonary    breath sounds clear to auscultation       Cardiovascular hypertension,  Rhythm:Regular Rate:Normal     Neuro/Psych  Headaches,  Neuromuscular disease    GI/Hepatic negative GI ROS, Neg liver ROS,   Endo/Other  negative endocrine ROS  Renal/GU negative Renal ROS     Musculoskeletal   Abdominal   Peds  Hematology   Anesthesia Other Findings   Reproductive/Obstetrics                             Anesthesia Physical Anesthesia Plan  ASA: III  Anesthesia Plan: General   Post-op Pain Management:    Induction: Intravenous  PONV Risk Score and Plan: 4 or greater and Ondansetron, Dexamethasone, Midazolam and Scopolamine patch - Pre-op  Airway Management Planned: Oral ETT  Additional Equipment:   Intra-op Plan:   Post-operative Plan: Possible Post-op intubation/ventilation  Informed Consent: I have reviewed the patients History and Physical, chart, labs and discussed the procedure including the risks, benefits and alternatives for the proposed anesthesia with the patient or authorized representative who has indicated his/her understanding and acceptance.   Dental advisory given  Plan Discussed with: CRNA and Anesthesiologist  Anesthesia Plan Comments:         Anesthesia Quick Evaluation

## 2017-07-14 NOTE — Transfer of Care (Signed)
Immediate Anesthesia Transfer of Care Note  Patient: Tammy Brennan  Procedure(s) Performed: Anterior Lumbar Interbody Fusion Lumbar five-Sacral one (N/A Abdomen) Lumbar five-Sacral one pedicle screws (N/A Back) ABDOMINAL EXPOSURE (N/A Abdomen)  Patient Location: PACU  Anesthesia Type:General  Level of Consciousness: awake, alert , oriented and patient cooperative  Airway & Oxygen Therapy: Patient Spontanous Breathing and Patient connected to face mask oxygen  Post-op Assessment: Report given to RN and Post -op Vital signs reviewed and stable  Post vital signs: Reviewed and stable  Last Vitals:  Vitals Value Taken Time  BP 116/68 07/14/2017 12:26 PM  Temp    Pulse 97 07/14/2017 12:29 PM  Resp 18 07/14/2017 12:29 PM  SpO2 100 % 07/14/2017 12:29 PM  Vitals shown include unvalidated device data.  Last Pain:  Vitals:   07/14/17 0600  TempSrc:   PainSc: 8       Patients Stated Pain Goal: 3 (07/14/17 0600)  Complications: No apparent anesthesia complications

## 2017-07-14 NOTE — Progress Notes (Signed)
Urine preg negative

## 2017-07-14 NOTE — Anesthesia Postprocedure Evaluation (Signed)
Anesthesia Post Note  Patient: Tammy Brennan  Procedure(s) Performed: Anterior Lumbar Interbody Fusion Lumbar five-Sacral one (N/A Abdomen) Lumbar five-Sacral one pedicle screws (N/A Back) ABDOMINAL EXPOSURE (N/A Abdomen)     Patient location during evaluation: PACU Anesthesia Type: General Level of consciousness: awake and alert Pain management: pain level controlled Vital Signs Assessment: post-procedure vital signs reviewed and stable Respiratory status: spontaneous breathing, nonlabored ventilation, respiratory function stable and patient connected to nasal cannula oxygen Cardiovascular status: blood pressure returned to baseline and stable Postop Assessment: no apparent nausea or vomiting Anesthetic complications: no    Last Vitals:  Vitals:   07/14/17 1340 07/14/17 1411  BP: (!) 114/55 (!) 94/59  Pulse: 67 (!) 59  Resp: 12 18  Temp:  36.6 C  SpO2: 93% 99%    Last Pain:  Vitals:   07/14/17 1411  TempSrc: Oral  PainSc:                  Jezelle Gullick DAVID

## 2017-07-15 ENCOUNTER — Encounter (HOSPITAL_COMMUNITY): Payer: Self-pay | Admitting: Neurosurgery

## 2017-07-15 MED ORDER — OXYCODONE HCL 10 MG PO TABS: 10.0000 mg | ORAL_TABLET | ORAL | 0 refills | Status: DC | PRN

## 2017-07-15 MED ORDER — CYCLOBENZAPRINE HCL 10 MG PO TABS: 10.0000 mg | ORAL_TABLET | Freq: Three times a day (TID) | ORAL | 0 refills | Status: DC | PRN

## 2017-07-15 MED FILL — Heparin Sodium (Porcine) Inj 1000 Unit/ML: INTRAMUSCULAR | Qty: 30 | Status: AC

## 2017-07-15 MED FILL — Thrombin For Soln 20000 Unit: CUTANEOUS | Qty: 1 | Status: AC

## 2017-07-15 MED FILL — Sodium Chloride IV Soln 0.9%: INTRAVENOUS | Qty: 1000 | Status: AC

## 2017-07-15 NOTE — Progress Notes (Signed)
Patient ID: Tammy Brennan, female   DOB: 1975/08/04, 42 y.o.   MRN: [id number] Comfortable this morning.  Sitting up at the bedside.  Walked yesterday evening and is walked this morning. Her vomiting. Reports back soreness but minimal abdominal soreness. Stable postop day #1.  Will not follow actively.  Please call if we can assist.

## 2017-07-15 NOTE — Evaluation (Signed)
Occupational Therapy Evaluation Patient Details Name: Tammy Brennan MRN: [id number] DOB: [birth year] Today's Date: 07/15/2017    History of Present Illness Pt is a 42 y/o female who presents s/p L5-S1 ALIF on 07/14/17.    Clinical Impression   Patient evaluated by Occupational Therapy with no further acute OT needs identified. All education has been completed and the patient has no further questions. See below for any follow-up Occupational Therapy or equipment needs. OT to sign off. Thank you for referral.      Follow Up Recommendations  No OT follow up    Equipment Recommendations  3 in 1 bedside commode    Recommendations for Other Services       Precautions / Restrictions Precautions Precautions: Fall;Back Precaution Booklet Issued: Yes (comment) Precaution Comments: reviwed alds with back precautions Required Braces or Orthoses: Spinal Brace Spinal Brace: Lumbar corset;Applied in sitting position Restrictions Weight Bearing Restrictions: No      Mobility Bed Mobility Overal bed mobility: Needs Assistance Bed Mobility: Supine to Sit;Sit to Supine     Supine to sit: Min assist Sit to supine: Min assist   General bed mobility comments: requires cues for body alignment and elevation of bil LEs  Transfers Overall transfer level: Modified independent Equipment used: None             General transfer comment: cues to scoot fully to eob to power up    Balance Overall balance assessment: No apparent balance deficits (not formally assessed)                                         ADL either performed or assessed with clinical judgement   ADL Overall ADL's : Needs assistance/impaired Eating/Feeding: Independent   Grooming: Wash/dry hands;Modified independent   Upper Body Bathing: Modified independent;Sitting   Lower Body Bathing: Supervison/ safety;Adhering to back precautions;Sit to/from stand;With adaptive equipment Lower Body  Bathing Details (indicate cue type and reason): reachers reacher sock aide for LLE and able to figure 4 cross RLE Upper Body Dressing : Modified independent Upper Body Dressing Details (indicate cue type and reason): able to don doff brace with min cues for straps Lower Body Dressing: Supervision/safety;With adaptive equipment;Cueing for back precautions   Toilet Transfer: Supervision/safety Toilet Transfer Details (indicate cue type and reason): completed during session Toileting- Clothing Manipulation and Hygiene: Supervision/safety       Functional mobility during ADLs: Supervision/safety General ADL Comments: educated on seating in home, walking and laying vs prolonged sitting, bed mobility , pain management at night    Back handout provided and reviewed adls in detail. Pt educated on: clothing between brace, never sleep in brace, set an alarm at night for medication, avoid sitting for long periods of time, correct bed positioning for sleeping, correct sequence for bed mobility, avoiding lifting more than 5 pounds and never wash directly over incision. All education is complete and patient indicates understanding.   Vision   Vision Assessment?: No apparent visual deficits     Perception     Praxis      Pertinent Vitals/Pain Pain Assessment: 0-10 Pain Score: 4  Pain Location: back Pain Descriptors / Indicators: Operative site guarding;Aching Pain Intervention(s): Monitored during session;Premedicated before session;Repositioned     Hand Dominance Right   Extremity/Trunk Assessment Upper Extremity Assessment Upper Extremity Assessment: Overall WFL for tasks assessed   Lower Extremity Assessment Lower Extremity Assessment: Defer  to PT evaluation   Cervical / Trunk Assessment Cervical / Trunk Assessment: Other exceptions Cervical / Trunk Exceptions: s/p surgery   Communication Communication Communication: No difficulties   Cognition Arousal/Alertness:  Awake/alert Behavior During Therapy: WFL for tasks assessed/performed Overall Cognitive Status: Within Functional Limits for tasks assessed                                     General Comments  hemovac in place and drssing dry    Exercises     Shoulder Instructions      Home Living Family/patient expects to be discharged to:: Private residence Living Arrangements: Spouse/significant other;Children Available Help at Discharge: Family;Available 24 hours/day Type of Home: House Home Access: Stairs to enter [employer] of Steps: 3 Entrance Stairs-Rails: None Home Layout: One level     Bathroom Shower/Tub: Producer, television/film/video: Standard     Home Equipment: None   Additional Comments: Above information is for Mom's house where she will be discharging to. When pt gets back to her home eventually she will not have stairs to enter but will have stairs to get to children's rooms. 2 boys (6 and 46 yo)      Prior Functioning/Environment Level of Independence: Independent                 OT Problem List:        OT Treatment/Interventions:      OT Goals(Current goals can be found in the care plan section) Acute Rehab OT Goals Patient Stated Goal: Home today OT Goal Formulation: With patient/family  OT Frequency:     Barriers to D/C:            Co-evaluation              AM-PAC PT "6 Clicks" Daily Activity     Outcome Measure Help from another person eating meals?: None Help from another person taking care of personal grooming?: None Help from another person toileting, which includes using toliet, bedpan, or urinal?: None Help from another person bathing (including washing, rinsing, drying)?: None Help from another person to put on and taking off regular upper body clothing?: None Help from another person to put on and taking off regular lower body clothing?: A Little 6 Click Score: 23   End of Session Equipment  Utilized During Treatment: Back brace Nurse Communication: Mobility status;Precautions  Activity Tolerance: Patient tolerated treatment well Patient left: in bed;with call bell/phone within reach;with family/visitor present  OT Visit Diagnosis: Unsteadiness on feet (R26.81)                Time: 1610-9604 OT Time Calculation (min): 38 min Charges:  OT General Charges $OT Visit: 1 Visit OT Evaluation $OT Eval Moderate Complexity: 1 Mod OT Treatments $Self Care/Home Management : 8-22 mins G-Codes:      Mateo Flow   OTR/L Pager: [phone] Office: [phone] .'   Boone Master B 07/15/2017, 11:01 AM

## 2017-07-15 NOTE — Evaluation (Signed)
Physical Therapy Evaluation and Discharge Patient Details Name: Tammy Brennan MRN: [id number] DOB: September 13, 1975 Today's Date: 07/15/2017   History of Present Illness  Pt is a 42 y/o female who presents s/p L5-S1 ALIF on 07/14/17.   Clinical Impression  Patient evaluated by Physical Therapy with no further acute PT needs identified. All education has been completed and the patient has no further questions. At the time of PT eval pt was able to perform transfers and ambulation with gross modified independence. She completed stair training with supervision for safety and mother present for education. Pt was instructed in back precautions, car transfer, brace application/wearing schedule, and general safety with activity progression.  See below for any follow-up Physical Therapy or equipment needs. PT is signing off. Thank you for this referral.       Follow Up Recommendations No PT follow up;Supervision - Intermittent    Equipment Recommendations  3in1 (PT)    Recommendations for Other Services       Precautions / Restrictions Precautions Precautions: Fall;Back Precaution Booklet Issued: Yes (comment) Required Braces or Orthoses: Spinal Brace Spinal Brace: Lumbar corset;Applied in sitting position Restrictions Weight Bearing Restrictions: No      Mobility  Bed Mobility               General bed mobility comments: Pt sitting up on EOB when PT arrived. Pt was educated on log roll technique.   Transfers Overall transfer level: Modified independent Equipment used: None             General transfer comment: Guarded due to pain but overall steady. No assist required.   Ambulation/Gait Ambulation/Gait assistance: Modified independent (Device/Increase time) Ambulation Distance (Feet): 200 Feet Assistive device: None Gait Pattern/deviations: Step-through pattern;Decreased stride length;Trunk flexed Gait velocity: Decreased Gait velocity interpretation: <1.31 ft/sec,  indicative of household ambulator General Gait Details: VC's for improved posture. Pt overall ambulating slow and guarded but without unsteadiness/LOB.   Stairs Stairs: Yes Stairs assistance: Supervision Stair Management: One rail Left;Step to pattern;Forwards Number of Stairs: 3 General stair comments: VC's for sequencing and general safety with stair negotiation.   Wheelchair Mobility    Modified Rankin (Stroke Patients Only)       Balance Overall balance assessment: No apparent balance deficits (not formally assessed)                                           Pertinent Vitals/Pain Pain Assessment: 0-10 Pain Score: 4  Pain Location: back Pain Descriptors / Indicators: Operative site guarding;Aching Pain Intervention(s): Limited activity within patient's tolerance;Monitored during session;Repositioned    Home Living Family/patient expects to be discharged to:: Private residence Living Arrangements: Spouse/significant other;Children Available Help at Discharge: Family;Available 24 hours/day Type of Home: House Home Access: Stairs to enter Entrance Stairs-Rails: None Entrance Stairs-Number of Steps: 3 Home Layout: One level Home Equipment: None Additional Comments: Above information is for Mom's house where she will be discharging to. When pt gets back to her home eventually she will not have stairs to enter but will have stairs to get to children's rooms.     Prior Function Level of Independence: Independent               Hand Dominance   Dominant Hand: Right    Extremity/Trunk Assessment   Upper Extremity Assessment Upper Extremity Assessment: Overall WFL for tasks assessed    Lower  Extremity Assessment Lower Extremity Assessment: Generalized weakness(Consistent with pre-op diagnosis)    Cervical / Trunk Assessment Cervical / Trunk Assessment: Other exceptions Cervical / Trunk Exceptions: s/p surgery  Communication    Communication: No difficulties  Cognition Arousal/Alertness: Awake/alert Behavior During Therapy: WFL for tasks assessed/performed Overall Cognitive Status: Within Functional Limits for tasks assessed                                        General Comments      Exercises     Assessment/Plan    PT Assessment Patent does not need any further PT services  PT Problem List         PT Treatment Interventions      PT Goals (Current goals can be found in the Care Plan section)  Acute Rehab PT Goals Patient Stated Goal: Home today PT Goal Formulation: All assessment and education complete, DC therapy    Frequency     Barriers to discharge        Co-evaluation               AM-PAC PT "6 Clicks" Daily Activity  Outcome Measure Difficulty turning over in bed (including adjusting bedclothes, sheets and blankets)?: None Difficulty moving from lying on back to sitting on the side of the bed? : A Little Difficulty sitting down on and standing up from a chair with arms (e.g., wheelchair, bedside commode, etc,.)?: A Little Help needed moving to and from a bed to chair (including a wheelchair)?: A Little Help needed walking in hospital room?: A Little Help needed climbing 3-5 steps with a railing? : A Little 6 Click Score: 19    End of Session Equipment Utilized During Treatment: Gait belt;Back brace Activity Tolerance: Patient tolerated treatment well Patient left: in bed;with call bell/phone within reach;with family/visitor present Nurse Communication: Mobility status PT Visit Diagnosis: Pain;Other symptoms and signs involving the nervous system (R29.898) Pain - part of body: (back)    Time: 8295-6213 PT Time Calculation (min) (ACUTE ONLY): 20 min   Charges:   PT Evaluation $PT Eval Moderate Complexity: 1 Mod     PT G Codes:        Conni Slipper, PT, DPT Acute Rehabilitation Services Pager: [phone]   Marylynn Pearson 07/15/2017, 10:10 AM

## 2017-07-15 NOTE — Discharge Instructions (Signed)

## 2017-07-15 NOTE — Progress Notes (Signed)
Patient alert and oriented, mae's well, voiding adequate amount of urine, swallowing without difficulty,  c/o mild pain at time of discharge. Patient discharged home with family. Script and discharged instructions given to patient. Patient and family stated understanding of instructions given. Patient has an appointment with Dr. Cram   

## 2017-07-28 ENCOUNTER — Ambulatory Visit: Payer: BLUE CROSS/BLUE SHIELD | Admitting: Cardiology

## 2017-07-31 ENCOUNTER — Other Ambulatory Visit: Payer: Self-pay | Admitting: Medical

## 2017-08-06 NOTE — Discharge Summary (Signed)
Physician Discharge Summary  Patient ID: Tammy Brennan MRN: [id number] DOB/AGE: 1975/05/01 42 y.o. Estimated body mass index is 35.12 kg/m as calculated from the following:   Height as of this encounter:  (1.575 m).   Weight as of this encounter: 87.1 kg (192 lb).   Admit date: 07/14/2017 Discharge date: 08/06/2017  Admission Diagnoses: Grade 1 spondylolisthesis L5-S1  Discharge Diagnoses: Same Active Problems:   Spondylolisthesis at L5-S1 level   Discharged Condition: good  Hospital Course: Patient was admitted to the hospital underwent anterior lumbar interbody fusion with posterior screw fixation for augmentation. Postoperatively patient did very well in the floor on the floor was angling and voiding spontaneously tolerating regular diet stable for discharge home on postoperative day 1. At the time of discharge patient was doing very well significant improvement pain control  Consults: Significant Diagnostic Studies: Treatments: Anterior lumbar interbody fusion with posterior pedicle screws Discharge Exam: Blood pressure 106/66, pulse 67, temperature 98.3 F (36.8 C), temperature source Oral, resp. rate 18, height  (1.575 m), weight 87.1 kg (192 lb), SpO2 100 %. Strength out of 5 wounds clean dry and intact  Disposition: Home   Allergies as of 07/15/2017   No Known Allergies     Medication List    TAKE these medications   aspirin-acetaminophen-caffeine 250-250-65 MG tablet Commonly known as:  EXCEDRIN MIGRAINE Take 2 tablets by mouth 2 (two) times daily as needed for headache.   Biotin 5000 MCG Tabs Take 5,000 mcg by mouth daily.   cyclobenzaprine 10 MG tablet Commonly known as:  FLEXERIL Take 1 tablet (10 mg total) by mouth 3 (three) times daily as needed for muscle spasms.   hydrochlorothiazide 12.5 MG capsule Commonly known as:  MICROZIDE Take 1 capsule (12.5 mg total) by mouth daily.   norethindrone-ethinyl estradiol 1-20 MG-MCG tablet Commonly  known as:  MICROGESTIN,JUNEL,LOESTRIN Take 1 tablet by mouth daily.   Oxycodone HCl 10 MG Tabs Take 1 tablet (10 mg total) by mouth every 3 (three) hours as needed for severe pain ((score 7 to 10)).   valACYclovir 1000 MG tablet Commonly known as:  VALTREX Take 2 g by mouth 2 (two) times daily as needed. Cold sores.      Follow-up Information    Donalee Citrin, MD Follow up.   Specialty:  Neurosurgery Contact information: [address] [place] [address] [phone]           Signed: Mariam Dollar 08/06/2017, 5:08 PM

## 2017-08-19 DIAGNOSIS — M47817 Spondylosis without myelopathy or radiculopathy, lumbosacral region: Secondary | ICD-10-CM | POA: Diagnosis not present

## 2017-08-25 NOTE — Addendum Note (Signed)
Addended by: Crist FatLOCKHART, Tyrie Porzio P on: 08/25/2017 09:54 AM   Modules accepted: Orders

## 2017-08-26 ENCOUNTER — Telehealth: Payer: Self-pay | Admitting: *Deleted

## 2017-08-26 NOTE — Telephone Encounter (Signed)
Patient's husband notified of sleep study appointment. He will also have patient to return a call to me as well to discuss details.

## 2017-08-26 NOTE — Telephone Encounter (Signed)
-----   Message from Crist Fatatherine P Lockhart, RN sent at 08/25/2017  9:53 AM EDT ----- Regarding: Please schedule Please schedule this patient for a sleep study to evaluate for sleep apnea due to snoring. Thanks!

## 2017-09-13 ENCOUNTER — Encounter (HOSPITAL_BASED_OUTPATIENT_CLINIC_OR_DEPARTMENT_OTHER): Payer: BLUE CROSS/BLUE SHIELD

## 2017-09-30 DIAGNOSIS — M5440 Lumbago with sciatica, unspecified side: Secondary | ICD-10-CM | POA: Diagnosis not present

## 2017-10-01 ENCOUNTER — Other Ambulatory Visit: Payer: Self-pay | Admitting: Medical

## 2017-10-16 DIAGNOSIS — R61 Generalized hyperhidrosis: Secondary | ICD-10-CM | POA: Diagnosis not present

## 2017-12-20 ENCOUNTER — Other Ambulatory Visit: Payer: Self-pay | Admitting: Medical

## 2018-01-01 DIAGNOSIS — M5440 Lumbago with sciatica, unspecified side: Secondary | ICD-10-CM | POA: Diagnosis not present

## 2018-01-23 ENCOUNTER — Other Ambulatory Visit: Payer: Self-pay | Admitting: Family Medicine

## 2018-02-18 ENCOUNTER — Other Ambulatory Visit: Payer: Self-pay | Admitting: Family Medicine

## 2018-04-16 DIAGNOSIS — M5440 Lumbago with sciatica, unspecified side: Secondary | ICD-10-CM | POA: Diagnosis not present

## 2018-05-10 DIAGNOSIS — J019 Acute sinusitis, unspecified: Secondary | ICD-10-CM | POA: Diagnosis not present

## 2018-06-15 ENCOUNTER — Ambulatory Visit (INDEPENDENT_AMBULATORY_CARE_PROVIDER_SITE_OTHER): Payer: BLUE CROSS/BLUE SHIELD | Admitting: Family Medicine

## 2018-06-15 ENCOUNTER — Other Ambulatory Visit: Payer: Self-pay

## 2018-06-15 ENCOUNTER — Encounter: Payer: Self-pay | Admitting: Family Medicine

## 2018-06-15 ENCOUNTER — Other Ambulatory Visit (INDEPENDENT_AMBULATORY_CARE_PROVIDER_SITE_OTHER): Payer: BLUE CROSS/BLUE SHIELD

## 2018-06-15 DIAGNOSIS — R3989 Other symptoms and signs involving the genitourinary system: Secondary | ICD-10-CM

## 2018-06-15 LAB — COMPREHENSIVE METABOLIC PANEL
ALT: 14 U/L (ref 0–35)
AST: 14 U/L (ref 0–37)
Albumin: 3.9 g/dL (ref 3.5–5.2)
Alkaline Phosphatase: 63 U/L (ref 39–117)
BUN: 18 mg/dL (ref 6–23)
CO2: 27 mEq/L (ref 19–32)
Calcium: 9.2 mg/dL (ref 8.4–10.5)
Chloride: 101 mEq/L (ref 96–112)
Creatinine, Ser: 0.66 mg/dL (ref 0.40–1.20)
GFR: 97.07 mL/min (ref >60.00)
Glucose, Bld: 106 mg/dL — ABNORMAL HIGH (ref 70–99)
Potassium: 3.9 mEq/L (ref 3.5–5.1)
Sodium: 137 mEq/L (ref 135–145)
Total Bilirubin: 0.3 mg/dL (ref 0.2–1.2)
Total Protein: 6.4 g/dL (ref 6.0–8.3)

## 2018-06-15 LAB — URINALYSIS, ROUTINE W REFLEX MICROSCOPIC
Bilirubin Urine: NEGATIVE
Hgb urine dipstick: NEGATIVE
Ketones, ur: NEGATIVE
Leukocytes,Ua: NEGATIVE
Nitrite: NEGATIVE
Specific Gravity, Urine: 1.025 (ref 1.000–1.030)
Total Protein, Urine: NEGATIVE
Urine Glucose: NEGATIVE
Urobilinogen, UA: 0.2 (ref 0.0–1.0)
pH: 6 (ref 5.0–8.0)

## 2018-06-15 LAB — MICROALBUMIN / CREATININE URINE RATIO
Creatinine,U: 105.4 mg/dL
Microalb Creat Ratio: 0.7 mg/g (ref 0.0–30.0)
Microalb, Ur: <0.7 mg/dL (ref 0.0–1.9)

## 2018-06-15 LAB — TSH: TSH: 2.28 u[IU]/mL (ref 0.35–4.50)

## 2018-06-15 NOTE — Progress Notes (Signed)
Virtual Visit via Video Note  I connected with [name] on 06/15/18 at  9:00 AM EDT by a video enabled telemedicine application and verified that I am speaking with the correct person using two identifiers.   I discussed the limitations of evaluation and management by telemedicine and the availability of in person appointments. The patient expressed understanding and agreed to proceed.  History of Present Illness: 3 d of orange urine w odor in AM. Gets better throughout day. Denies fevers, N/V, abd pain, dysuria, discharge, injury, freq, incomplete emptying, frothy appearance, new supplementation, oral intake changes, or new medications.    Observations/Objective: No conversational dyspnea Age appropriate judgment and insight Nml affect and mood  Assessment and Plan: Urine discoloration - Plan: TSH, Comprehensive metabolic panel, Microalbumin / creatinine urine ratio, Urinalysis, Routine w reflex microscopic  May need more time. Stay hydrated, will come in for above. If neg, will give it a few more days and if not improving, will refer to urology.   Follow Up Instructions: PRN   I discussed the assessment and treatment plan with the patient. The patient was provided an opportunity to ask questions and all were answered. The patient agreed with the plan and demonstrated an understanding of the instructions.   The patient was advised to call back or seek an in-person evaluation if the symptoms worsen or if the condition fails to improve as anticipated.   Tammy Roche Montague, DO

## 2018-06-16 ENCOUNTER — Encounter: Payer: Self-pay | Admitting: Family Medicine

## 2018-06-22 DIAGNOSIS — Z6836 Body mass index (BMI) 36.0-36.9, adult: Secondary | ICD-10-CM | POA: Diagnosis not present

## 2018-06-22 DIAGNOSIS — Z01419 Encounter for gynecological examination (general) (routine) without abnormal findings: Secondary | ICD-10-CM | POA: Diagnosis not present

## 2018-06-22 DIAGNOSIS — Z1151 Encounter for screening for human papillomavirus (HPV): Secondary | ICD-10-CM | POA: Diagnosis not present

## 2018-06-22 DIAGNOSIS — Z124 Encounter for screening for malignant neoplasm of cervix: Secondary | ICD-10-CM | POA: Diagnosis not present

## 2018-07-19 DIAGNOSIS — H6691 Otitis media, unspecified, right ear: Secondary | ICD-10-CM | POA: Diagnosis not present

## 2018-08-11 DIAGNOSIS — F4321 Adjustment disorder with depressed mood: Secondary | ICD-10-CM | POA: Diagnosis not present

## 2018-08-17 DIAGNOSIS — F4321 Adjustment disorder with depressed mood: Secondary | ICD-10-CM | POA: Diagnosis not present

## 2018-08-28 DIAGNOSIS — Z1231 Encounter for screening mammogram for malignant neoplasm of breast: Secondary | ICD-10-CM | POA: Diagnosis not present

## 2018-12-29 ENCOUNTER — Telehealth: Payer: Self-pay | Admitting: Family Medicine

## 2018-12-29 MED ORDER — LOSARTAN POTASSIUM 100 MG PO TABS: ORAL_TABLET | ORAL | 0 refills | Status: DC

## 2018-12-29 MED ORDER — HYDROCHLOROTHIAZIDE 12.5 MG PO CAPS: ORAL_CAPSULE | ORAL | 0 refills | Status: DC

## 2018-12-29 NOTE — Telephone Encounter (Signed)
Medication Refill - Medication:  hydrochlorothiazide (MICROZIDE) 12.5 MG capsule [[id number]]  losartan (COZAAR) 100 MG tablet [[id number]]   Has the patient contacted their pharmacy? No. (Agent: If no, request that the patient contact the pharmacy for the refill.) (Agent: If yes, when and what did the pharmacy advise?)  Preferred Pharmacy (with phone number or street name):  [id number] - [place], [place] - [address] AT [place] [phone] (Phone)     Agent: Please be advised that RX refills may take up to 3 business days. We ask that you follow-up with your pharmacy.

## 2018-12-31 NOTE — Telephone Encounter (Signed)
Tammy Brennan, Please take a look at her mychart message I sent her and reach out if need be thanks

## 2019-01-01 ENCOUNTER — Other Ambulatory Visit: Payer: Self-pay

## 2019-01-05 ENCOUNTER — Ambulatory Visit: Payer: BLUE CROSS/BLUE SHIELD | Admitting: Family Medicine

## 2019-01-14 DIAGNOSIS — B078 Other viral warts: Secondary | ICD-10-CM | POA: Diagnosis not present

## 2019-01-14 DIAGNOSIS — B009 Herpesviral infection, unspecified: Secondary | ICD-10-CM | POA: Diagnosis not present

## 2019-03-02 ENCOUNTER — Ambulatory Visit (INDEPENDENT_AMBULATORY_CARE_PROVIDER_SITE_OTHER): Payer: BC Managed Care – PPO | Admitting: Family Medicine

## 2019-03-02 ENCOUNTER — Other Ambulatory Visit: Payer: Self-pay

## 2019-03-02 DIAGNOSIS — E782 Mixed hyperlipidemia: Secondary | ICD-10-CM

## 2019-03-02 DIAGNOSIS — I10 Essential (primary) hypertension: Secondary | ICD-10-CM

## 2019-03-02 MED ORDER — LOSARTAN POTASSIUM-HCTZ 100-12.5 MG PO TABS: 1.0000 | ORAL_TABLET | Freq: Every day | ORAL | 1 refills | Status: DC

## 2019-03-03 NOTE — Assessment & Plan Note (Addendum)
Monitor and report any concerns. no changes to meds. Encouraged heart healthy diet such as the DASH diet and exercise as tolerated. Given refill on Losartanhct

## 2019-03-03 NOTE — Progress Notes (Signed)
Virtual Visit via Video Note  I connected with Aviva Signs on 03/02/19 at  2:20 PM EST by a video enabled telemedicine application and verified that I am speaking with the correct person using two identifiers.  Location: Patient: home Provider: home   I discussed the limitations of evaluation and management by telemedicine and the availability of in person appointments. The patient expressed understanding and agreed to proceed. Magdalene Molly, CMA was able to get the patient set up on a visit, video   Subjective:    Patient ID: Tammy Brennan, female    DOB: 07/13/1975, 43 y.o.   MRN: [id number]  No chief complaint on file.   HPI Patient is in today for follow up on chronic medical conditions including hypertension, hyperlipidemia and medication refills. She feels well. No recent febrile illness or hospitalizations. Her blood pressure has been well controlled on current meds. She is managing well during quarantine and is staying in most of the time. Is trying to eat well and stay active. Denies CP/palp/SOB/HA/congestion/fevers/GI or GU c/o. Taking meds as prescribed  Past Medical History:  Diagnosis Date  . Carpal tunnel syndrome 06/20/2014  . Dysrhythmia    PAPLITATIONS  . Essential hypertension 06/10/2017  . Headache    HX  MIGRAINES  . Hyperlipidemia, mixed 10/10/2014  . Obesity 10/10/2014  . PONV (postoperative nausea and vomiting)   . Preventative health care 09/27/2014   Sees Dr Hilbert Corrigan for GYN  . Wart 10/10/2014    Past Surgical History:  Procedure Laterality Date  . ABDOMINAL EXPOSURE N/A 07/14/2017   Procedure: ABDOMINAL EXPOSURE;  Surgeon: Rosetta Posner, MD;  Location: [facility];  Service: Vascular;  Laterality: N/A;  . ANTERIOR LUMBAR FUSION N/A 07/14/2017   Procedure: Anterior Lumbar Interbody Fusion Lumbar five-Sacral one;  Surgeon: Kary Kos, MD;  Location: [facility];  Service: Neurosurgery;  Laterality: N/A;  . CESAREAN SECTION     X2    Family History    Problem Relation Age of Onset  . Hypertension Mother   . Hypertension Father   . Cancer Father        lung, previous light smoker  . Alcohol abuse Maternal Grandfather   . Heart disease Maternal Grandfather        MI  . Dementia Paternal Grandfather   . Hypertension Sister     Social History   Socioeconomic History  . Marital status: Married    Spouse name: Not on file  . Number of children: Not on file  . Years of education: Not on file  . Highest education level: Not on file  Occupational History  . Not on file  Tobacco Use  . Smoking status: Never Smoker  . Smokeless tobacco: Never Used  Substance and Sexual Activity  . Alcohol use: No    Alcohol/week: 0.0 standard drinks  . Drug use: No  . Sexual activity: Yes    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, no dietary restrictions, Acupuncturist at [employer]  Other Topics Concern  . Not on file  Social History Narrative  . Not on file   Social Determinants of Health   Financial Resource Strain:   . Difficulty of Paying Living Expenses: Not on file  Food Insecurity:   . Worried About Charity fundraiser in the Last Year: Not on file  . Ran Out of Food in the Last Year: Not on file  Transportation Needs:   . Lack of Transportation (Medical): Not on file  .  Lack of Transportation (Non-Medical): Not on file  Physical Activity:   . Days of Exercise per Week: Not on file  . Minutes of Exercise per Session: Not on file  Stress:   . Feeling of Stress : Not on file  Social Connections:   . Frequency of Communication with Friends and Family: Not on file  . Frequency of Social Gatherings with Friends and Family: Not on file  . Attends Religious Services: Not on file  . Active Member of Clubs or Organizations: Not on file  . Attends BankerClub or Organization Meetings: Not on file  . Marital Status: Not on file  Intimate Partner Violence:   . Fear of Current or Ex-Partner: Not on file  . Emotionally Abused: Not on file  .  Physically Abused: Not on file  . Sexually Abused: Not on file    Outpatient Medications Prior to Visit  Medication Sig Dispense Refill  . aspirin-acetaminophen-caffeine (EXCEDRIN MIGRAINE) 250-250-65 MG tablet Take 2 tablets by mouth 2 (two) times daily as needed for headache.    . Biotin 5000 MCG TABS Take 5,000 mcg by mouth daily.    . cyclobenzaprine (FLEXERIL) 10 MG tablet Take 1 tablet (10 mg total) by mouth 3 (three) times daily as needed for muscle spasms. 40 tablet 0  . norethindrone-ethinyl estradiol (MICROGESTIN,JUNEL,LOESTRIN) 1-20 MG-MCG tablet Take 1 tablet by mouth daily.    Marland Kitchen. oxyCODONE 10 MG TABS Take 1 tablet (10 mg total) by mouth every 3 (three) hours as needed for severe pain ((score 7 to 10)). 60 tablet 0  . valACYclovir (VALTREX) 1000 MG tablet Take 2 g by mouth 2 (two) times daily as needed. Cold sores.  6  . hydrochlorothiazide (MICROZIDE) 12.5 MG capsule TAKE 1 CAPSULE(12.5 MG) BY MOUTH DAILY *NEEDS APPT FOR FURTHER REFILLS* 30 capsule 0  . losartan (COZAAR) 100 MG tablet TAKE 1 TABLET(100 MG) BY MOUTH DAILY *NEEDS APPT FOR FURTHER REFILL* 30 tablet 0   No facility-administered medications prior to visit.    No Known Allergies  Review of Systems  Constitutional: Negative for fever and malaise/fatigue.  HENT: Negative for congestion.   Eyes: Negative for blurred vision.  Respiratory: Negative for shortness of breath.   Cardiovascular: Negative for chest pain, palpitations and leg swelling.  Gastrointestinal: Negative for abdominal pain, blood in stool and nausea.  Genitourinary: Negative for dysuria and frequency.  Musculoskeletal: Negative for falls.  Skin: Negative for rash.  Neurological: Negative for dizziness, loss of consciousness and headaches.  Endo/Heme/Allergies: Negative for environmental allergies.  Psychiatric/Behavioral: Negative for depression. The patient is not nervous/anxious.        Objective:    Physical Exam Constitutional:       Appearance: Normal appearance. She is not ill-appearing.  HENT:     Head: Normocephalic and atraumatic.     Nose: Nose normal.  Eyes:     General:        Right eye: No discharge.        Left eye: No discharge.  Pulmonary:     Effort: Pulmonary effort is normal.  Neurological:     Mental Status: She is alert and oriented to person, place, and time.  Psychiatric:        Behavior: Behavior normal.     There were no vitals taken for this visit. Wt Readings from Last 3 Encounters:  07/14/17 192 lb (87.1 kg)  07/07/17 192 lb 4.8 oz (87.2 kg)  07/01/17 192 lb (87.1 kg)    Diabetic Foot  Exam - Simple   No data filed     Lab Results  Component Value Date   WBC 9.7 07/07/2017   HGB 13.5 07/07/2017   HCT 40.3 07/07/2017   PLT 423 (H) 07/07/2017   GLUCOSE 106 (H) 06/15/2018   CHOL 183 06/02/2017   TRIG 196.0 (H) 06/02/2017   HDL 72.90 06/02/2017   LDLDIRECT 94.0 09/27/2014   LDLCALC 71 06/02/2017   ALT 14 06/15/2018   AST 14 06/15/2018   NA 137 06/15/2018   K 3.9 06/15/2018   CL 101 06/15/2018   CREATININE 0.66 06/15/2018   BUN 18 06/15/2018   CO2 27 06/15/2018   TSH 2.28 06/15/2018   HGBA1C 5.8 06/21/2016   MICROALBUR <0.7 06/15/2018    Lab Results  Component Value Date   TSH 2.28 06/15/2018   Lab Results  Component Value Date   WBC 9.7 07/07/2017   HGB 13.5 07/07/2017   HCT 40.3 07/07/2017   MCV 90.8 07/07/2017   PLT 423 (H) 07/07/2017   Lab Results  Component Value Date   NA 137 06/15/2018   K 3.9 06/15/2018   CO2 27 06/15/2018   GLUCOSE 106 (H) 06/15/2018   BUN 18 06/15/2018   CREATININE 0.66 06/15/2018   BILITOT 0.3 06/15/2018   ALKPHOS 63 06/15/2018   AST 14 06/15/2018   ALT 14 06/15/2018   PROT 6.4 06/15/2018   ALBUMIN 3.9 06/15/2018   CALCIUM 9.2 06/15/2018   ANIONGAP 11 07/07/2017   GFR 97.07 06/15/2018   Lab Results  Component Value Date   CHOL 183 06/02/2017   Lab Results  Component Value Date   HDL 72.90 06/02/2017   Lab  Results  Component Value Date   LDLCALC 71 06/02/2017   Lab Results  Component Value Date   TRIG 196.0 (H) 06/02/2017   Lab Results  Component Value Date   CHOLHDL 3 06/02/2017   Lab Results  Component Value Date   HGBA1C 5.8 06/21/2016       Assessment & Plan:   Problem List Items Addressed This Visit    Hyperlipidemia, mixed    Encouraged heart healthy diet, increase exercise, avoid trans fats, consider a krill oil cap daily      Relevant Medications   losartan-hydrochlorothiazide (HYZAAR) 100-12.5 MG tablet   Essential hypertension    Monitor and report any concerns. no changes to meds. Encouraged heart healthy diet such as the DASH diet and exercise as tolerated. Given refill on Losartanhct      Relevant Medications   losartan-hydrochlorothiazide (HYZAAR) 100-12.5 MG tablet      I have discontinued [name] K. Deveny's losartan and hydrochlorothiazide. I am also having her start on losartan-hydrochlorothiazide. Additionally, I am having her maintain her norethindrone-ethinyl estradiol, Biotin, aspirin-acetaminophen-caffeine, valACYclovir, cyclobenzaprine, and Oxycodone HCl.  Meds ordered this encounter  Medications  . losartan-hydrochlorothiazide (HYZAAR) 100-12.5 MG tablet    Sig: Take 1 tablet by mouth daily.    Dispense:  90 tablet    Refill:  1     I discussed the assessment and treatment plan with the patient. The patient was provided an opportunity to ask questions and all were answered. The patient agreed with the plan and demonstrated an understanding of the instructions.   The patient was advised to call back or seek an in-person evaluation if the symptoms worsen or if the condition fails to improve as anticipated.  I provided 15 minutes of non-face-to-face time during this encounter.   Danise Edge, MD

## 2019-03-03 NOTE — Assessment & Plan Note (Signed)
Encouraged heart healthy diet, increase exercise, avoid trans fats, consider a krill oil cap daily 

## 2019-05-03 ENCOUNTER — Ambulatory Visit (INDEPENDENT_AMBULATORY_CARE_PROVIDER_SITE_OTHER): Payer: BC Managed Care – PPO | Admitting: Family Medicine

## 2019-05-03 ENCOUNTER — Other Ambulatory Visit: Payer: Self-pay

## 2019-05-03 ENCOUNTER — Encounter: Payer: Self-pay | Admitting: Family Medicine

## 2019-05-03 VITALS — BP 126/84 | HR 97 | Temp 96.9°F | Ht 64.0 in | Wt 213.2 lb

## 2019-05-03 DIAGNOSIS — R739 Hyperglycemia, unspecified: Secondary | ICD-10-CM

## 2019-05-03 DIAGNOSIS — R002 Palpitations: Secondary | ICD-10-CM | POA: Diagnosis not present

## 2019-05-03 DIAGNOSIS — E669 Obesity, unspecified: Secondary | ICD-10-CM

## 2019-05-03 DIAGNOSIS — Z0001 Encounter for general adult medical examination with abnormal findings: Secondary | ICD-10-CM

## 2019-05-03 DIAGNOSIS — R5383 Other fatigue: Secondary | ICD-10-CM | POA: Diagnosis not present

## 2019-05-03 DIAGNOSIS — M4317 Spondylolisthesis, lumbosacral region: Secondary | ICD-10-CM

## 2019-05-03 DIAGNOSIS — E781 Pure hyperglyceridemia: Secondary | ICD-10-CM | POA: Diagnosis not present

## 2019-05-03 DIAGNOSIS — E782 Mixed hyperlipidemia: Secondary | ICD-10-CM

## 2019-05-03 DIAGNOSIS — I10 Essential (primary) hypertension: Secondary | ICD-10-CM

## 2019-05-03 DIAGNOSIS — Z0000 Encounter for general adult medical examination without abnormal findings: Secondary | ICD-10-CM

## 2019-05-03 DIAGNOSIS — G479 Sleep disorder, unspecified: Secondary | ICD-10-CM

## 2019-05-03 DIAGNOSIS — R0683 Snoring: Secondary | ICD-10-CM

## 2019-05-03 LAB — CBC
HCT: 38.4 % (ref 36.0–46.0)
Hemoglobin: 12.7 g/dL (ref 12.0–15.0)
MCHC: 33 g/dL (ref 30.0–36.0)
MCV: 91 fl (ref 78.0–100.0)
Platelets: 447 10*3/uL — ABNORMAL HIGH (ref 150.0–400.0)
RBC: 4.23 Mil/uL (ref 3.87–5.11)
RDW: 13.6 % (ref 11.5–15.5)
WBC: 11.9 10*3/uL — ABNORMAL HIGH (ref 4.0–10.5)

## 2019-05-03 LAB — HEMOGLOBIN A1C: Hgb A1c MFr Bld: 5.9 % (ref 4.6–6.5)

## 2019-05-03 LAB — TSH: TSH: 1.72 u[IU]/mL (ref 0.35–4.50)

## 2019-05-03 MED ORDER — LOSARTAN POTASSIUM-HCTZ 100-12.5 MG PO TABS: 1.0000 | ORAL_TABLET | Freq: Every day | ORAL | 2 refills | Status: DC

## 2019-05-03 NOTE — Patient Instructions (Signed)
[employer] Blood Pressure cuff, upper arm, want BP 100-140/60-90 Pulse oximeter, want oxygen in 90s  Weekly vitals  Take Multivitamin with minerals, selenium Vitamin D 1000-2000 IU daily Probiotic with lactobacillus and bifidophilus Asprin EC 81 mg daily  Melatonin 2-5 mg at bedtime  [email or web address]   Preventive Care 51-46 Years Old, Female Preventive care refers to visits with your health care provider and lifestyle choices that can promote health and wellness. This includes:  A yearly physical exam. This may also be called an annual well check.  Regular dental visits and eye exams.  Immunizations.  Screening for certain conditions.  Healthy lifestyle choices, such as eating a healthy diet, getting regular exercise, not using drugs or products that contain nicotine and tobacco, and limiting alcohol use. What can I expect for my preventive care visit? Physical exam Your health care provider will check your:  Height and weight. This may be used to calculate body mass index (BMI), which tells if you are at a healthy weight.  Heart rate and blood pressure.  Skin for abnormal spots. Counseling Your health care provider may ask you questions about your:  Alcohol, tobacco, and drug use.  Emotional well-being.  Home and relationship well-being.  Sexual activity.  Eating habits.  Work and work Statistician.  Method of birth control.  Menstrual cycle.  Pregnancy history. What immunizations do I need?  Influenza (flu) vaccine  This is recommended every year. Tetanus, diphtheria, and pertussis (Tdap) vaccine  You may need a Td booster every 10 years. Varicella (chickenpox) vaccine  You may need this if you have not been vaccinated. Human papillomavirus (HPV) vaccine  If recommended by your health care provider, you may need three doses over 6 months. Measles, mumps, and rubella (MMR) vaccine  You may need at least one dose  of MMR. You may also need a second dose. Meningococcal conjugate (MenACWY) vaccine  One dose is recommended if you are age 74-21 years and a first-year college student living in a residence hall, or if you have one of several medical conditions. You may also need additional booster doses. Pneumococcal conjugate (PCV13) vaccine  You may need this if you have certain conditions and were not previously vaccinated. Pneumococcal polysaccharide (PPSV23) vaccine  You may need one or two doses if you smoke cigarettes or if you have certain conditions. Hepatitis A vaccine  You may need this if you have certain conditions or if you travel or work in places where you may be exposed to hepatitis A. Hepatitis B vaccine  You may need this if you have certain conditions or if you travel or work in places where you may be exposed to hepatitis B. Haemophilus influenzae type b (Hib) vaccine  You may need this if you have certain conditions. You may receive vaccines as individual doses or as more than one vaccine together in one shot (combination vaccines). Talk with your health care provider about the risks and benefits of combination vaccines. What tests do I need?  Blood tests  Lipid and cholesterol levels. These may be checked every 5 years starting at age 69.  Hepatitis C test.  Hepatitis B test. Screening  Diabetes screening. This is done by checking your blood sugar (glucose) after you have not eaten for a while (fasting).  Sexually transmitted disease (STD) testing.  BRCA-related cancer screening. This may be done if you have a family history of breast, ovarian, tubal, or peritoneal cancers.  Pelvic exam and Pap test. This may be done  every 3 years starting at age 44. Starting at age 80, this may be done every 5 years if you have a Pap test in combination with an HPV test. Talk with your health care provider about your test results, treatment options, and if necessary, the need for more  tests. Follow these instructions at home: Eating and drinking   Eat a diet that includes fresh fruits and vegetables, whole grains, lean protein, and low-fat dairy.  Take vitamin and mineral supplements as recommended by your health care provider.  Do not drink alcohol if: ? Your health care provider tells you not to drink. ? You are pregnant, may be pregnant, or are planning to become pregnant.  If you drink alcohol: ? Limit how much you have to 0-1 drink a day. ? Be aware of how much alcohol is in your drink. In the [place] one drink equals one 12 oz bottle of beer (355 mL), one 5 oz glass of wine (148 mL), or one 1 oz glass of hard liquor (44 mL). Lifestyle  Take daily care of your teeth and gums.  Stay active. Exercise for at least 30 minutes on 5 or more days each week.  Do not use any products that contain nicotine or tobacco, such as cigarettes, e-cigarettes, and chewing tobacco. If you need help quitting, ask your health care provider.  If you are sexually active, practice safe sex. Use a condom or other form of birth control (contraception) in order to prevent pregnancy and STIs (sexually transmitted infections). If you plan to become pregnant, see your health care provider for a preconception visit. What's next?  Visit your health care provider once a year for a well check visit.  Ask your health care provider how often you should have your eyes and teeth checked.  Stay up to date on all vaccines. This information is not intended to replace advice given to you by your health care provider. Make sure you discuss any questions you have with your health care provider. Document Revised: 11/06/2017 Document Reviewed: 11/06/2017 Elsevier Patient Education  2020 [employer].

## 2019-05-04 ENCOUNTER — Other Ambulatory Visit: Payer: Self-pay | Admitting: Family Medicine

## 2019-05-04 DIAGNOSIS — R7989 Other specified abnormal findings of blood chemistry: Secondary | ICD-10-CM

## 2019-05-04 LAB — COMPREHENSIVE METABOLIC PANEL
ALT: 25 U/L (ref 0–35)
AST: 23 U/L (ref 0–37)
Albumin: 4.1 g/dL (ref 3.5–5.2)
Alkaline Phosphatase: 65 U/L (ref 39–117)
BUN: 18 mg/dL (ref 6–23)
CO2: 28 mEq/L (ref 19–32)
Calcium: 10.1 mg/dL (ref 8.4–10.5)
Chloride: 96 mEq/L (ref 96–112)
Creatinine, Ser: 0.6 mg/dL (ref 0.40–1.20)
GFR: 107.92 mL/min (ref >60.00)
Glucose, Bld: 112 mg/dL — ABNORMAL HIGH (ref 70–99)
Potassium: 4 mEq/L (ref 3.5–5.1)
Sodium: 135 mEq/L (ref 135–145)
Total Bilirubin: 0.3 mg/dL (ref 0.2–1.2)
Total Protein: 6.8 g/dL (ref 6.0–8.3)

## 2019-05-04 LAB — LIPID PANEL
Cholesterol: 200 mg/dL (ref 0–200)
HDL: 71.6 mg/dL (ref >39.00)
NonHDL: 128.61
Total CHOL/HDL Ratio: 3
Triglycerides: 232 mg/dL — ABNORMAL HIGH (ref 0.0–149.0)
VLDL: 46.4 mg/dL — ABNORMAL HIGH (ref 0.0–40.0)

## 2019-05-04 LAB — LDL CHOLESTEROL, DIRECT: Direct LDL: 96 mg/dL

## 2019-05-05 NOTE — Assessment & Plan Note (Signed)
Encouraged heart healthy diet, increase exercise, avoid trans fats, consider a krill oil cap daily 

## 2019-05-05 NOTE — Assessment & Plan Note (Signed)
Referred to pulmonology due to excessive fatigue, snoring, restless sleep, obesity with recent weight gain and hypertension. To consider sleep study

## 2019-05-05 NOTE — Assessment & Plan Note (Signed)
Encouraged DASH diet, decrease po intake and increase exercise as tolerated. Needs 7-8 hours of sleep nightly. Avoid trans fats, eat small, frequent meals every 4-5 hours with lean proteins, complex carbs and healthy fats. Minimize simple carbs, referred to healthy weight and wellness 

## 2019-05-05 NOTE — Assessment & Plan Note (Signed)
Underwent surgery with Dr Wynetta Emery and she notes her back is much better.

## 2019-05-05 NOTE — Progress Notes (Signed)
Subjective:    Patient ID: Tammy Brennan, female    DOB: [birth year], 44 y.o.   MRN: [id number]  Chief Complaint  Patient presents with  . Annual Exam  . Fatigue    HPI Patient is in today for annual preventative exam and follow up on chronic medical concerns. No recent febrile illness or hospitalizations. Had fusion at L5 S1 and she has had great improvement in pain. She is frustrated with weight gain and she admits she is exercising and moving less. Denies CP/palp/SOB/HA/congestion/fevers/GI or GU c/o. Taking meds as prescribed.   Past Medical History:  Diagnosis Date  . Carpal tunnel syndrome 06/20/2014  . Dysrhythmia    PAPLITATIONS  . Essential hypertension 06/10/2017  . Headache    HX  MIGRAINES  . Hyperlipidemia, mixed 10/10/2014  . Obesity 10/10/2014  . PONV (postoperative nausea and vomiting)   . Preventative health care 09/27/2014   Sees Dr Hilbert Corrigan for GYN  . Wart 10/10/2014    Past Surgical History:  Procedure Laterality Date  . ABDOMINAL EXPOSURE N/A 07/14/2017   Procedure: ABDOMINAL EXPOSURE;  Surgeon: Rosetta Posner, MD;  Location: [facility];  Service: Vascular;  Laterality: N/A;  . ANTERIOR LUMBAR FUSION N/A 07/14/2017   Procedure: Anterior Lumbar Interbody Fusion Lumbar five-Sacral one;  Surgeon: Kary Kos, MD;  Location: [facility];  Service: Neurosurgery;  Laterality: N/A;  . CESAREAN SECTION     X2    Family History  Problem Relation Age of Onset  . Hypertension Mother   . Hypertension Father   . Cancer Father        lung, previous light smoker  . Alcohol abuse Maternal Grandfather   . Heart disease Maternal Grandfather        MI  . Dementia Paternal Grandfather   . Hypertension Sister     Social History   Socioeconomic History  . Marital status: Married    Spouse name: Not on file  . Number of children: Not on file  . Years of education: Not on file  . Highest education level: Not on file  Occupational History  . Not on file  Tobacco Use  .  Smoking status: Never Smoker  . Smokeless tobacco: Never Used  Substance and Sexual Activity  . Alcohol use: No    Alcohol/week: 0.0 standard drinks  . Drug use: No  . 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 Not on file  Social History Narrative  . Not on file   Social Determinants of Health   Financial Resource Strain:   . Difficulty of Paying Living Expenses: Not on file  Food Insecurity:   . Worried About Charity fundraiser in the Last Year: Not on file  . Ran Out of Food in the Last Year: Not on file  Transportation Needs:   . Lack of Transportation (Medical): Not on file  . Lack of Transportation (Non-Medical): Not on file  Physical Activity:   . Days of Exercise per Week: Not on file  . Minutes of Exercise per Session: Not on file  Stress:   . Feeling of Stress : Not on file  Social Connections:   . Frequency of Communication with Friends and Family: Not on file  . Frequency of Social Gatherings with Friends and Family: Not on file  . Attends Religious Services: Not on file  . Active Member of Clubs  or Organizations: Not on file  . Attends Banker Meetings: Not on file  . Marital Status: Not on file  Intimate Partner Violence:   . Fear of Current or Ex-Partner: Not on file  . Emotionally Abused: Not on file  . Physically Abused: Not on file  . Sexually Abused: Not on file    Outpatient Medications Prior to Visit  Medication Sig Dispense Refill  . aspirin-acetaminophen-caffeine (EXCEDRIN MIGRAINE) 250-250-65 MG tablet Take 2 tablets by mouth 2 (two) times daily as needed for headache.    . Biotin 5000 MCG TABS Take 5,000 mcg by mouth daily.    . Multiple Vitamin (MULTIVITAMIN) tablet Take 1 tablet by mouth daily.    . norethindrone-ethinyl estradiol (MICROGESTIN,JUNEL,LOESTRIN) 1-20 MG-MCG tablet Take 1 tablet by mouth daily.    . valACYclovir  (VALTREX) 1000 MG tablet Take 2 g by mouth 2 (two) times daily as needed. Cold sores.  6  . losartan-hydrochlorothiazide (HYZAAR) 100-12.5 MG tablet Take 1 tablet by mouth daily. 90 tablet 1  . cyclobenzaprine (FLEXERIL) 10 MG tablet Take 1 tablet (10 mg total) by mouth 3 (three) times daily as needed for muscle spasms. 40 tablet 0  . oxyCODONE 10 MG TABS Take 1 tablet (10 mg total) by mouth every 3 (three) hours as needed for severe pain ((score 7 to 10)). 60 tablet 0   No facility-administered medications prior to visit.    No Known Allergies  Review of Systems  Constitutional: Positive for malaise/fatigue. Negative for chills and fever.  HENT: Negative for congestion and hearing loss.   Eyes: Negative for discharge.  Respiratory: Negative for cough, sputum production and shortness of breath.   Cardiovascular: Negative for chest pain, palpitations and leg swelling.  Gastrointestinal: Negative for abdominal pain, blood in stool, constipation, diarrhea, heartburn, nausea and vomiting.  Genitourinary: Negative for dysuria, frequency, hematuria and urgency.  Musculoskeletal: Negative for back pain, falls and myalgias.  Skin: Negative for rash.  Neurological: Negative for dizziness, sensory change, loss of consciousness, weakness and headaches.  Endo/Heme/Allergies: Negative for environmental allergies. Does not bruise/bleed easily.  Psychiatric/Behavioral: Negative for depression and suicidal ideas. The patient has insomnia. The patient is not nervous/anxious.        Objective:    Physical Exam Constitutional:      General: She is not in acute distress.    Appearance: She is well-developed.  HENT:     Head: Normocephalic and atraumatic.  Eyes:     Conjunctiva/sclera: Conjunctivae normal.  Neck:     Thyroid: No thyromegaly.  Cardiovascular:     Rate and Rhythm: Normal rate and regular rhythm.     Heart sounds: Normal heart sounds. No murmur.  Pulmonary:     Effort: Pulmonary  effort is normal. No respiratory distress.     Breath sounds: Normal breath sounds.  Abdominal:     General: Bowel sounds are normal. There is no distension.     Palpations: Abdomen is soft. There is no mass.     Tenderness: There is no abdominal tenderness.  Musculoskeletal:     Cervical back: Neck supple.  Lymphadenopathy:     Cervical: No cervical adenopathy.  Skin:    General: Skin is warm and dry.  Neurological:     Mental Status: She is alert and oriented to person, place, and time.  Psychiatric:        Behavior: Behavior normal.     BP 126/84 (BP Location: Left Arm, Patient Position: Sitting, Cuff Size: Normal)  Pulse 97   Temp (!) 96.9 F (36.1 C) (Temporal)   Ht 5\' 4"  (1.626 m)   Wt 213 lb 4 oz (96.7 kg)   SpO2 98%   BMI 36.60 kg/m  Wt Readings from Last 3 Encounters:  05/03/19 213 lb 4 oz (96.7 kg)  07/14/17 192 lb (87.1 kg)  07/07/17 192 lb 4.8 oz (87.2 kg)    Diabetic Foot Exam - Simple   No data filed     Lab Results  Component Value Date   WBC 11.9 (H) 05/03/2019   HGB 12.7 05/03/2019   HCT 38.4 05/03/2019   PLT 447.0 (H) 05/03/2019   GLUCOSE 112 (H) 05/03/2019   CHOL 200 05/03/2019   TRIG 232.0 (H) 05/03/2019   HDL 71.60 05/03/2019   LDLDIRECT 96.0 05/03/2019   LDLCALC 71 06/02/2017   ALT 25 05/03/2019   AST 23 05/03/2019   NA 135 05/03/2019   K 4.0 05/03/2019   CL 96 05/03/2019   CREATININE 0.60 05/03/2019   BUN 18 05/03/2019   CO2 28 05/03/2019   TSH 1.72 05/03/2019   HGBA1C 5.9 05/03/2019   MICROALBUR <0.7 06/15/2018    Lab Results  Component Value Date   TSH 1.72 05/03/2019   Lab Results  Component Value Date   WBC 11.9 (H) 05/03/2019   HGB 12.7 05/03/2019   HCT 38.4 05/03/2019   MCV 91.0 05/03/2019   PLT 447.0 (H) 05/03/2019   Lab Results  Component Value Date   NA 135 05/03/2019   K 4.0 05/03/2019   CO2 28 05/03/2019   GLUCOSE 112 (H) 05/03/2019   BUN 18 05/03/2019   CREATININE 0.60 05/03/2019   BILITOT 0.3  05/03/2019   ALKPHOS 65 05/03/2019   AST 23 05/03/2019   ALT 25 05/03/2019   PROT 6.8 05/03/2019   ALBUMIN 4.1 05/03/2019   CALCIUM 10.1 05/03/2019   ANIONGAP 11 07/07/2017   GFR 107.92 05/03/2019   Lab Results  Component Value Date   CHOL 200 05/03/2019   Lab Results  Component Value Date   HDL 71.60 05/03/2019   Lab Results  Component Value Date   LDLCALC 71 06/02/2017   Lab Results  Component Value Date   TRIG 232.0 (H) 05/03/2019   Lab Results  Component Value Date   CHOLHDL 3 05/03/2019   Lab Results  Component Value Date   HGBA1C 5.9 05/03/2019       Assessment & Plan:   Problem List Items Addressed This Visit    Preventative health care    Patient encouraged to maintain heart healthy diet, regular exercise, adequate sleep. Consider daily probiotics. Take medications as prescribed. Follows with GYN for paps, MGM is up to date      Hyperlipidemia, mixed    Encouraged heart healthy diet, increase exercise, avoid trans fats, consider a krill oil cap daily      Relevant Medications   losartan-hydrochlorothiazide (HYZAAR) 100-12.5 MG tablet   Other Relevant Orders   Lipid panel (Completed)   TSH (Completed)   Obesity - Primary    Encouraged DASH diet, decrease po intake and increase exercise as tolerated. Needs 7-8 hours of sleep nightly. Avoid trans fats, eat small, frequent meals every 4-5 hours with lean proteins, complex carbs and healthy fats. Minimize simple carbs, referred to healthy weight and wellness      Relevant Orders   Amb Ref to Medical [facility]   Palpitations   Relevant Orders   CBC (Completed)   Hypertriglyceridemia  Relevant Medications   losartan-hydrochlorothiazide (HYZAAR) 100-12.5 MG tablet   Other Relevant Orders   Lipid panel (Completed)   TSH (Completed)   Snoring    Referred to pulmonology due to excessive fatigue, snoring, restless sleep, obesity with recent weight gain and hypertension. To consider sleep  study      Relevant Orders   Ambulatory referral to Pulmonology   Essential hypertension    Well controlled, no changes to meds. Encouraged heart healthy diet such as the DASH diet and exercise as tolerated.       Relevant Medications   losartan-hydrochlorothiazide (HYZAAR) 100-12.5 MG tablet   Spondylolisthesis at L5-S1 level    Underwent surgery with Dr Wynetta Emery and she notes her back is much better.        Other Visit Diagnoses    Hyperglycemia       Relevant Orders   Comprehensive metabolic panel (Completed)   Hemoglobin A1c (Completed)   Restless sleeper       Relevant Orders   Ambulatory referral to Pulmonology   Other fatigue       Relevant Orders   Hemoglobin A1c (Completed)   Ambulatory referral to Pulmonology      I have discontinued [name] K. Anastas's cyclobenzaprine and Oxycodone HCl. I am also having her maintain her norethindrone-ethinyl estradiol, Biotin, aspirin-acetaminophen-caffeine, valACYclovir, multivitamin, and losartan-hydrochlorothiazide.  Meds ordered this encounter  Medications  . losartan-hydrochlorothiazide (HYZAAR) 100-12.5 MG tablet    Sig: Take 1 tablet by mouth daily.    Dispense:  90 tablet    Refill:  2     Danise Edge, MD

## 2019-05-05 NOTE — Assessment & Plan Note (Signed)
Patient encouraged to maintain heart healthy diet, regular exercise, adequate sleep. Consider daily probiotics. Take medications as prescribed. Follows with GYN for paps, MGM is up to date

## 2019-05-05 NOTE — Assessment & Plan Note (Signed)
Well controlled, no changes to meds. Encouraged heart healthy diet such as the DASH diet and exercise as tolerated.  °

## 2019-05-10 ENCOUNTER — Encounter: Payer: Self-pay | Admitting: Family Medicine

## 2019-05-10 IMAGING — CR DG OR LOCAL ABDOMEN
1 series · 1 of 1 positions shown · non-contrast
Comparison: 05/06/2017 lumbar spine CT

CLINICAL DATA: L5-S1 ALIF, instrument count

EXAM:
OR LOCAL ABDOMEN

[AP]
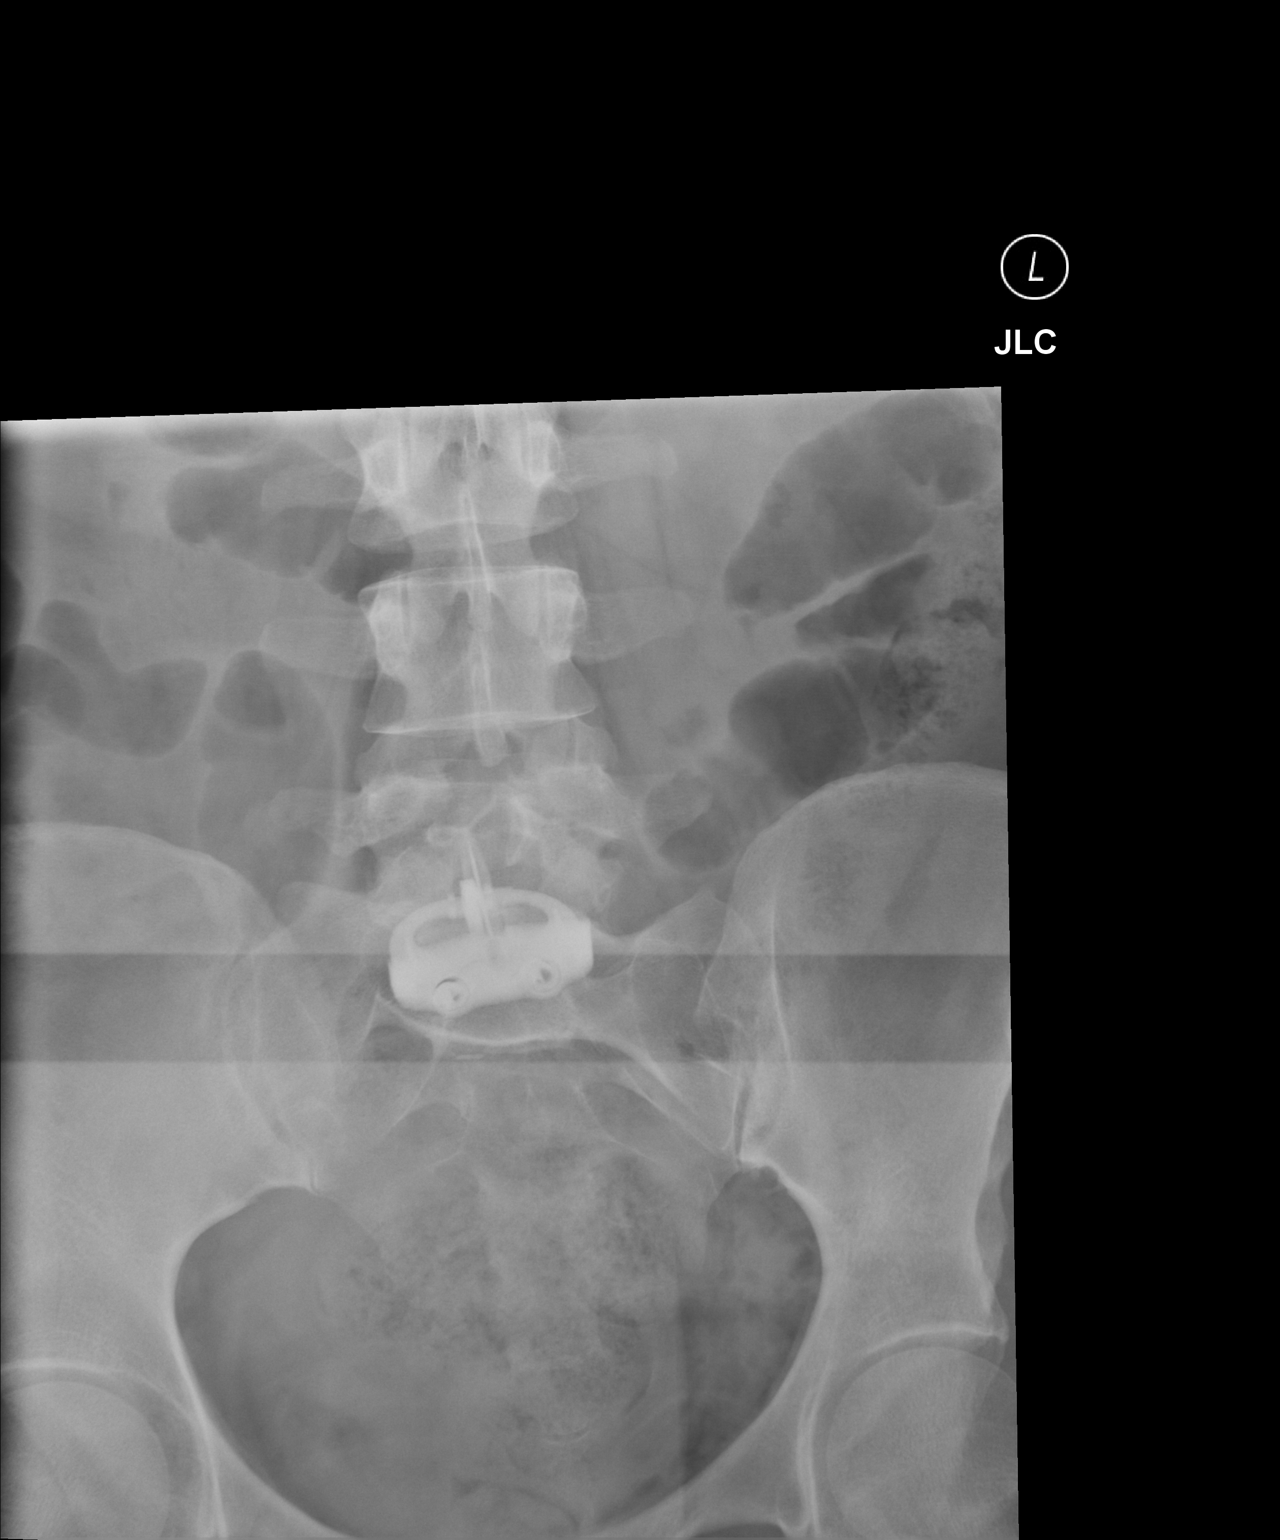

[1 of 1 positions shown; findings below may reference images not displayed]

FINDINGS: Surgical plate with interlocking screws overlie the L5-S1 disc
space. No additional radiopaque foreign body.
IMPRESSION: Surgical plate with interlocking screws overlie the L5-S1 disc
space. No additional radiopaque foreign body.

These results were called by telephone at the time of interpretation
acknowledged these results.

## 2019-05-10 IMAGING — RF DG C-ARM 61-120 MIN
1 series · 2 of 2 positions shown · non-contrast
Comparison: None.

CLINICAL DATA: L5-S1 ALIF with pedicle screws.

FLUOROSCOPY TIME:  1 minutes 50 seconds.
Images: 2
EXAM:
LUMBAR SPINE - 2-3 VIEW

[Series 1: run · 2 of 2 slices shown]
[im 1/2]
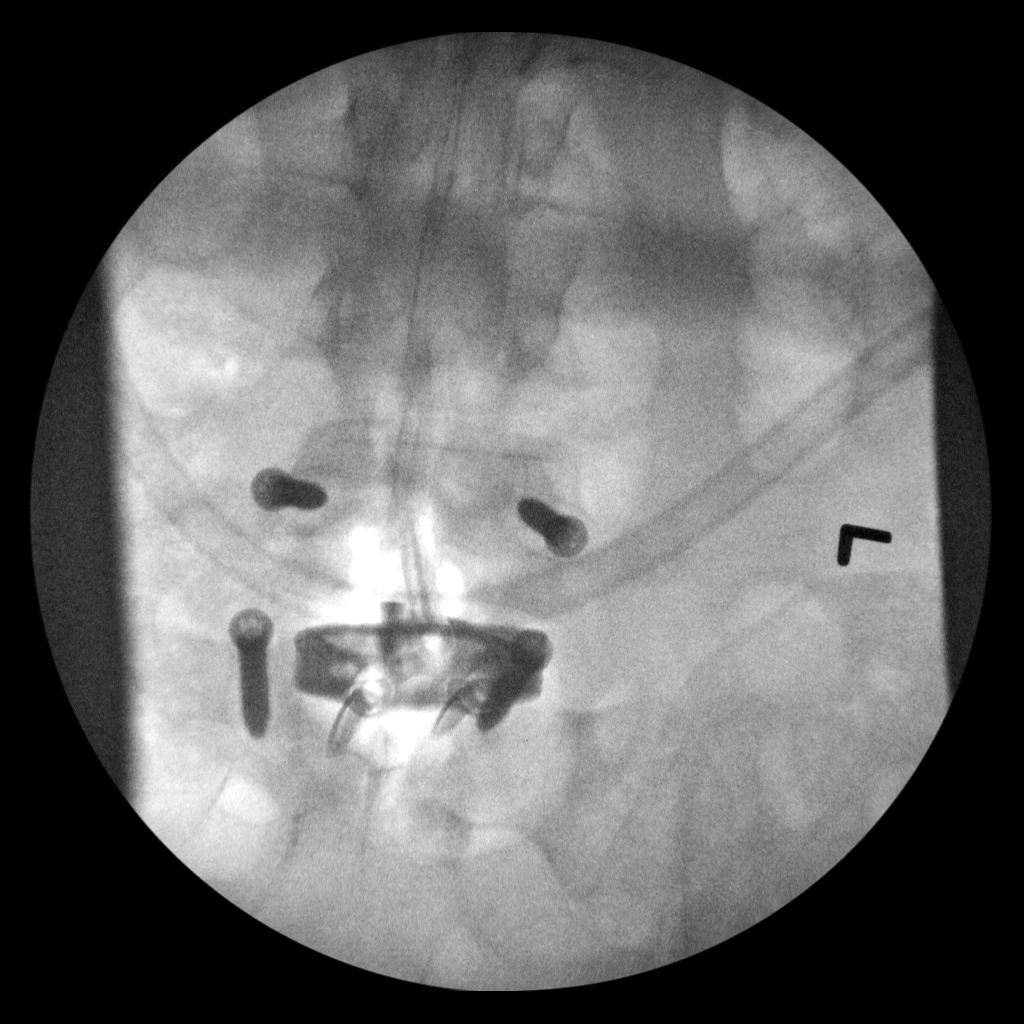
[im 2/2]
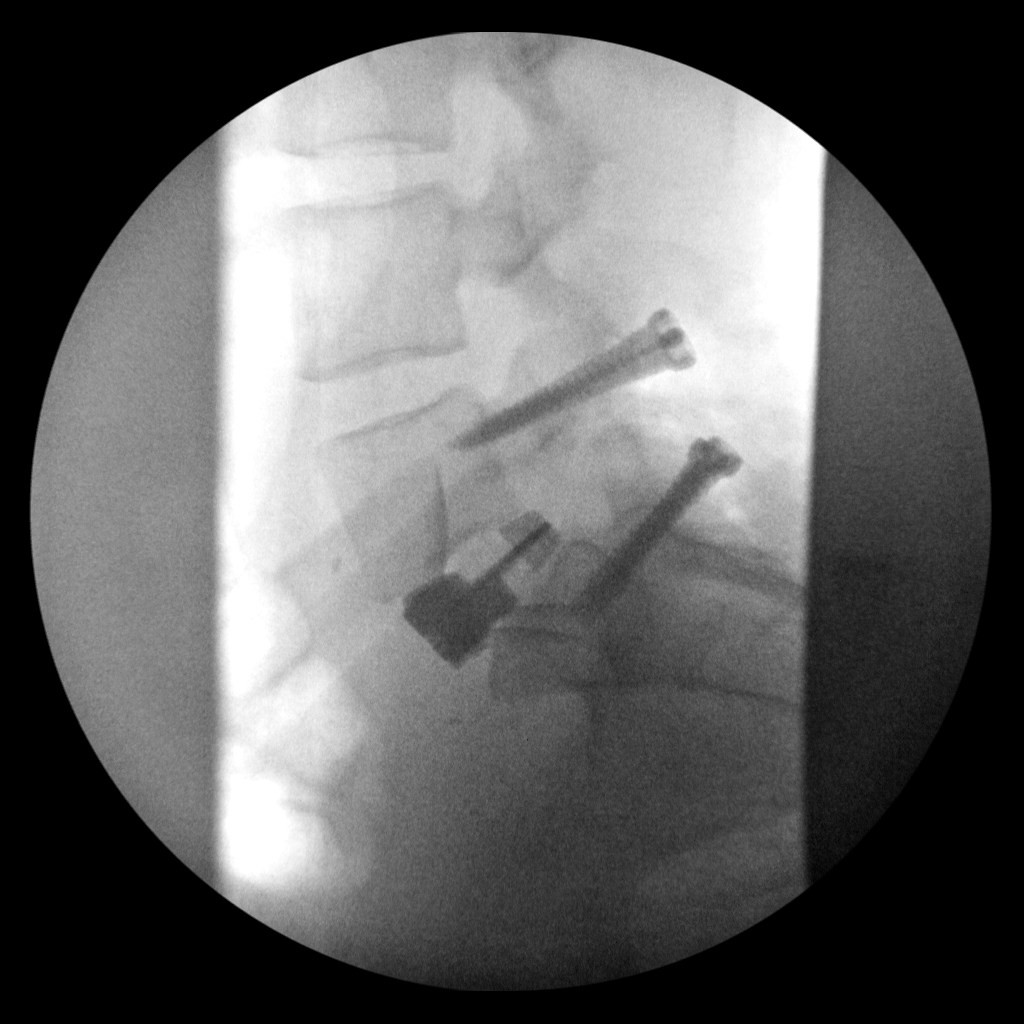

[2 of 2 positions shown; findings below may reference images not displayed]

FINDINGS: Pedicle screws have been placed at L5 and S1, in good position on
limited imaging. A disc spacer device is seen at L5-S1 as well, in
good position.
IMPRESSION: Surgical hardware placement as above

## 2019-05-20 ENCOUNTER — Encounter (INDEPENDENT_AMBULATORY_CARE_PROVIDER_SITE_OTHER): Payer: Self-pay | Admitting: Family Medicine

## 2019-05-20 ENCOUNTER — Ambulatory Visit (INDEPENDENT_AMBULATORY_CARE_PROVIDER_SITE_OTHER): Payer: BC Managed Care – PPO | Admitting: Family Medicine

## 2019-05-20 ENCOUNTER — Other Ambulatory Visit: Payer: Self-pay

## 2019-05-20 VITALS — BP 111/73 | HR 63 | Temp 97.3°F | Ht 63.0 in | Wt 206.0 lb

## 2019-05-20 DIAGNOSIS — Z1331 Encounter for screening for depression: Secondary | ICD-10-CM

## 2019-05-20 DIAGNOSIS — R5383 Other fatigue: Secondary | ICD-10-CM | POA: Diagnosis not present

## 2019-05-20 DIAGNOSIS — E7849 Other hyperlipidemia: Secondary | ICD-10-CM

## 2019-05-20 DIAGNOSIS — R0602 Shortness of breath: Secondary | ICD-10-CM

## 2019-05-20 DIAGNOSIS — Z9189 Other specified personal risk factors, not elsewhere classified: Secondary | ICD-10-CM | POA: Diagnosis not present

## 2019-05-20 DIAGNOSIS — R739 Hyperglycemia, unspecified: Secondary | ICD-10-CM

## 2019-05-20 DIAGNOSIS — I10 Essential (primary) hypertension: Secondary | ICD-10-CM | POA: Diagnosis not present

## 2019-05-20 DIAGNOSIS — Z0289 Encounter for other administrative examinations: Secondary | ICD-10-CM

## 2019-05-20 DIAGNOSIS — Z6836 Body mass index (BMI) 36.0-36.9, adult: Secondary | ICD-10-CM
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
## 2019-05-20 NOTE — Progress Notes (Signed)
Dear Misty Stanley A. Abner Greenspan, MD,   Thank you for referring Tammy Brennan to our clinic. The following note includes my evaluation and treatment recommendations.  Chief Complaint:   OBESITY Tammy Brennan (MR# [id number]) is a 44 y.o. female who presents for evaluation and treatment of obesity and related comorbidities. Current BMI is Body mass index is 36.49 kg/m. [name] has been struggling with her weight for many years and has been unsuccessful in either losing weight, maintaining weight loss, or reaching her healthy weight goal.  [name] is currently in the action stage of change and ready to dedicate time achieving and maintaining a healthier weight. [name] is interested in becoming our patient and working on intensive lifestyle modifications including (but not limited to) diet and exercise for weight loss.  Tammy Brennan's habits were reviewed today and are as follows: Her family eats meals together, her desired weight loss is 66 lbs, she started gaining weight after her back surgery, her heaviest weight ever was 213 pounds, she is a picky eater and doesn't like to eat healthier foods, she has significant food cravings issues, she frequently makes poor food choices, she frequently eats larger portions than normal and she struggles with emotional eating.  Depression Screen Tammy Brennan's Food and Mood (modified PHQ-9) score was 17.  Depression screen [facility] 2/9 05/20/2019  Decreased Interest 3  Down, Depressed, Hopeless 1  PHQ - 2 Score 4  Altered sleeping 3  Tired, decreased energy 3  Change in appetite 3  Feeling bad or failure about yourself  1  Trouble concentrating 1  Moving slowly or fidgety/restless 2  Suicidal thoughts 0  PHQ-9 Score 17  Difficult doing work/chores Not difficult at all   Subjective:   1. Other fatigue Tammy Brennan admits to daytime somnolence and admits to waking up still tired. Patent has a history of symptoms of daytime fatigue. Tammy Brennan generally gets 6  hours of sleep per night, and states that she has nightime awakenings. Tammy Brennan is present. Apneic episodes are not present. Epworth Sleepiness Score is 13.  2. SOB (shortness of breath) on exertion [name] notes increasing shortness of breath with exercising and seems to be worsening over time with weight gain. She notes getting out of breath sooner with activity than she used to. This has not gotten worse recently. [name] denies shortness of breath at rest or orthopnea.  3. Essential hypertension Tammy Brennan's blood pressure is well controlled on medications. She is ready to work on diet and weight loss.  4. Other hyperlipidemia Tammy Brennan has elevated LDL, and she is not on statin. She is attempting to improve with diet and weight loss.  5. Hyperglycemia Tammy Brennan has a history of elevated glucose and A1c. She notes polyphagia.  6. At risk for heart disease Tammy Brennan is at a higher than average risk for cardiovascular disease due to obesity. Reviewed: no chest pain on exertion, no dyspnea on exertion, and no swelling of ankles.  Assessment/Plan:   1. Other fatigue Tammy Brennan does feel that her weight is causing her energy to be lower than it should be. Fatigue may be related to obesity, depression or many other causes. Labs will be ordered, and in the meanwhile, Tammy Brennan will focus on self care including making healthy food choices, increasing physical activity and focusing on stress reduction.  - EKG 12-Lead - CBC with Differential/Platelet - Vitamin B12 - Folate - VITAMIN D 25 Hydroxy (Vit-D Deficiency, Fractures)  2. SOB (shortness of breath) on exertion Tammy Brennan does feel that she gets  out of breath more easily that she used to when she exercises. Tammy Brennan's shortness of breath appears to be obesity related and exercise induced. She has agreed to work on weight loss and gradually increase exercise to treat her exercise induced shortness of breath. Will continue to monitor closely.  3.  Essential hypertension Tammy Brennan will start her diet, and will continue working on healthy weight loss and exercise to improve blood pressure control. We will watch for signs of hypotension as she continues her lifestyle modifications. We will check labs today.  - Comprehensive metabolic panel  4. Other hyperlipidemia Cardiovascular risk and specific lipid/LDL goals reviewed. We discussed several lifestyle modifications today. Tammy Brennan will start her diet, and will continue to work on exercise and weight loss efforts. We will check labs today. Orders and follow up as documented in patient record.   - Lipid Panel With LDL/HDL Ratio  5. Hyperglycemia Fasting labs will be obtained today and results with be discussed with [name] in 2 weeks at her follow up visit. In the meanwhile Tammy Brennan was started on a lower simple carbohydrate diet and will work on weight loss efforts.  - Insulin, random  6. Depression screening [name] had a positive depression screening. Depression is commonly associated with obesity and often results in emotional eating behaviors. We will monitor this closely and work on CBT to help improve the non-hunger eating patterns. Referral to Psychology may be required if no improvement is seen as she continues in our clinic.  7. At risk for heart disease Tammy Brennan was given up to 15 minutes of coronary artery disease prevention counseling today. She is 44 y.o. female and has risk factors for heart disease including obesity. We discussed intensive lifestyle modifications today with an emphasis on specific weight loss instructions and strategies.   Repetitive spaced learning was employed today to elicit superior memory formation and behavioral change.  8. Class 2 severe obesity with serious comorbidity and body mass index (BMI) of 36.0 to 36.9 in adult, unspecified obesity type Tammy Brennan) Tammy Brennan is currently in the action stage of change and her goal is to continue with weight loss  efforts. I recommend [name] begin the structured treatment plan as follows:  She has agreed to the Category 2 Plan + 100 calories.  Exercise goals: No exercise has been prescribed for now, while we concentrate on nutritional changes.   Behavioral modification strategies: increasing lean protein intake and meal planning and cooking strategies.  She was informed of the importance of frequent follow-up visits to maximize her success with intensive lifestyle modifications for her multiple health conditions. She was informed we would discuss her lab results at her next visit unless there is a critical issue that needs to be addressed sooner. Kasee agreed to keep her next visit at the agreed upon time to discuss these results.  Objective:   Blood pressure 111/73, pulse 63, temperature (!) 97.3 F (36.3 C), temperature source Oral, height 5\' 3"  (1.6 m), weight 206 lb (93.4 kg), SpO2 98 %. Body mass index is 36.49 kg/m.  EKG: Normal sinus rhythm, rate 73 BPM.  Indirect Calorimeter completed today shows a VO2 of 334 and a REE of 2328.  Her calculated basal metabolic rate is thus her basal metabolic rate is better than expected.  General: Cooperative, alert, well developed, in no acute distress. HEENT: Conjunctivae and lids unremarkable. Cardiovascular: Regular rhythm.  Lungs: Normal work of breathing. Neurologic: No focal deficits.   Lab Results  Component Value Date   CREATININE  0.60 05/03/2019   BUN 18 05/03/2019   NA 135 05/03/2019   K 4.0 05/03/2019   CL 96 05/03/2019   CO2 28 05/03/2019   Lab Results  Component Value Date   ALT 25 05/03/2019   AST 23 05/03/2019   ALKPHOS 65 05/03/2019   BILITOT 0.3 05/03/2019   Lab Results  Component Value Date   HGBA1C 5.9 05/03/2019   HGBA1C 5.8 06/21/2016   No results found for: INSULIN Lab Results  Component Value Date   TSH 1.72 05/03/2019   Lab Results  Component Value Date   CHOL 200 05/03/2019   HDL 71.60  05/03/2019   LDLCALC 71 06/02/2017   LDLDIRECT 96.0 05/03/2019   TRIG 232.0 (H) 05/03/2019   CHOLHDL 3 05/03/2019   Lab Results  Component Value Date   WBC 11.9 (H) 05/03/2019   HGB 12.7 05/03/2019   HCT 38.4 05/03/2019   MCV 91.0 05/03/2019   PLT 447.0 (H) 05/03/2019   No results found for: IRON, TIBC, FERRITIN  Attestation Statements:   Reviewed by clinician on day of visit: allergies, medications, problem list, medical history, surgical history, family history, social history, and previous encounter notes.   I, Trixie Dredge, am acting as transcriptionist for Dennard Nip, MD.  I have reviewed the above documentation for accuracy and completeness, and I agree with the above. - Dennard Nip, MD

## 2019-05-20 NOTE — Progress Notes (Signed)
Office: [phone]  /  Fax: [phone]    Date: May 26, 2019   Appointment Start Time: 3:05pm Duration: 29 minutes Provider: Glennie Isle, Psy.D. Type of Session: Intake for Individual Therapy  Location of Patient: Home Location of Provider: Provider's Home Type of Contact: Telepsychological Visit via [employer] WebEx  Informed Consent: This provider called Tammy Brennan at 3:02pm as she did not present for the WebEx appointment. She noted she was in the process of joining. As such, today's appointment was initiated 5 minutes late. Prior to proceeding with today's appointment, two pieces of identifying information were obtained. In addition, Tammy Brennan's physical location at the time of this appointment was obtained as well a phone number she could be reached at in the event of technical difficulties. Tammy Brennan and this provider participated in today's telepsychological service.   The provider's role was explained to Tammy Brennan. The provider reviewed and discussed issues of confidentiality, privacy, and limits therein (e.g., reporting obligations). In addition to verbal informed consent, written informed consent for psychological services was obtained prior to the initial appointment. Since the clinic is not a 24/7 crisis center, mental health emergency resources were shared and this  provider explained MyChart, e-mail, voicemail, and/or other messaging systems should be utilized only for non-emergency reasons. This provider also explained that information obtained during appointments will be placed in Tammy Brennan's medical record and relevant information will be shared with other providers at [facility] for coordination of care. Moreover, Tammy Brennan agreed information may be shared with other [facility] providers as needed for coordination of care. By signing the service agreement document, Tammy Brennan provided written consent for coordination of care. Prior to initiating  telepsychological services, Tammy Brennan completed an informed consent document, which included the development of a safety plan (i.e., an emergency contact, nearest emergency room, and emergency resources) in the event of an emergency/crisis. Tammy Brennan expressed understanding of the rationale of the safety plan. Tammy Brennan verbally acknowledged understanding she is ultimately responsible for understanding her insurance benefits for telepsychological and in-person services. This provider also reviewed confidentiality, as it relates to telepsychological services, as well as the rationale for telepsychological services (i.e., to reduce exposure risk to COVID-19). Tammy Brennan  acknowledged understanding that appointments cannot be recorded without both party consent and she is aware she is responsible for securing confidentiality on her end of the session. Tammy Brennan verbally consented to proceed.  Chief Complaint/HPI: Tammy Brennan was referred by Dr. Dennard Nip. The note for the initial appointment (May 20, 2019) with Dr. Dennard Nip indicated the following: "Tammy Brennan's habits were reviewed today and are as follows: Her family eats meals together, her desired weight loss is 66 lbs, she started gaining weight after her back surgery, her heaviest weight ever was 213 pounds, she is a picky eater and doesn't like to eat healthier foods, she has significant food cravings issues, she frequently makes poor food choices, she frequently eats larger portions than normal and she struggles with emotional eating." Tammy Brennan's Food and Mood (modified PHQ-9) score on May 20, 2019 was 17.  During today's appointment, Tammy Brennan was verbally administered a questionnaire assessing various behaviors related to emotional eating. Tammy Brennan endorsed the following: experience food cravings on a regular basis, overeat frequently when you are bored or lonely and not worry about what you eat when you are in a good mood. She shared she craves sweets  (e.g., chocolate). Tammy Brennan believes the onset of emotional eating was likely in the last year, noting she is unsure what led to  the onset. She described the frequency of emotional eating as "daily" prior to starting with the clinic. In addition, Tammy Brennan denied a history of binge eating. Tammy Brennan denied a history of restricting food intake, purging and engagement in other compensatory strategies, and has never been diagnosed with an eating disorder. She also denied a history of treatment for emotional eating. Moreover, Tammy Brennan indicated boredom triggers emotional eating. She is unsure what makes emotional eating better. Furthermore, Tammy Brennan denied other problems of concern.    Mental Status Examination:  Appearance: well groomed and appropriate hygiene  Behavior: appropriate to circumstances Mood: euthymic Affect: mood congruent Speech: normal in rate, volume, and tone Eye Contact: appropriate Psychomotor Activity: appropriate Gait: unable to assess Thought Process: linear, logical, and goal directed  Thought Content/Perception: denies suicidal and homicidal ideation, plan, and intent and no hallucinations, delusions, bizarre thinking or behavior reported or observed Orientation: time, person, place and purpose of appointment Memory/Concentration: memory, attention, language, and fund of knowledge intact  Insight/Judgment: good  Family & Psychosocial History: Tammy Brennan reported she is married and she has two children (ages 32 and 20). 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 Additionally, Tammy Brennan shared her highest level of education obtained is "some college." Currently, Tammy Brennan's social support system consists of her husband, parents, and children. Moreover, Tammy Brennan stated she resides with her husband and children.   Medical History:  Past Medical History:  Diagnosis Date  . Carpal tunnel syndrome 06/20/2014  . Dysrhythmia    PAPLITATIONS    . Essential hypertension 06/10/2017  . Headache    HX  MIGRAINES  . Hyperlipidemia, mixed 10/10/2014  . Obesity 10/10/2014  . PONV (postoperative nausea and vomiting)   . Preventative health care 09/27/2014   Sees Dr Hilbert Corrigan for GYN  . Wart 10/10/2014   Past Surgical History:  Procedure Laterality Date  . ABDOMINAL EXPOSURE N/A 07/14/2017   Procedure: ABDOMINAL EXPOSURE;  Surgeon: Rosetta Posner, MD;  Location: [place];  Service: Vascular;  Laterality: N/A;  . ANTERIOR LUMBAR FUSION N/A 07/14/2017   Procedure: Anterior Lumbar Interbody Fusion Lumbar five-Sacral one;  Surgeon: Kary Kos, MD;  Location: [place];  Service: Neurosurgery;  Laterality: N/A;  . CESAREAN SECTION     X2   Current Outpatient Medications on File Prior to Visit  Medication Sig Dispense Refill  . aspirin 81 MG chewable tablet Chew by mouth daily.    Marland Kitchen aspirin-acetaminophen-caffeine (EXCEDRIN MIGRAINE) 250-250-65 MG tablet Take 2 tablets by mouth 2 (two) times daily as needed for headache.    . Biotin 5000 MCG TABS Take 5,000 mcg by mouth daily.    . hydrochlorothiazide (MICROZIDE) 12.5 MG capsule Take 12.5 mg by mouth daily.    Marland Kitchen losartan-hydrochlorothiazide (HYZAAR) 100-12.5 MG tablet Take 1 tablet by mouth daily. 90 tablet 2  . Multiple Vitamin (MULTIVITAMIN) tablet Take 1 tablet by mouth daily.    . norethindrone-ethinyl estradiol (MICROGESTIN,JUNEL,LOESTRIN) 1-20 MG-MCG tablet Take 1 tablet by mouth daily.    . valACYclovir (VALTREX) 1000 MG tablet Take 2 g by mouth 2 (two) times daily as needed. Cold sores.  6   No current facility-administered medications on file prior to visit.  Tammy Brennan denied a history of head injuries and loss of consciousness.    Mental Health History: Tammy Brennan recalled attending a "few therapy session" at the beginning of last year to address ongoing stressors. Tammy Brennan reported there is no history of hospitalizations for psychiatric concerns, and she has never  met with a psychiatrist.  Tammy Brennan stated she has never been prescribed psychotropic medications. Tammy Brennan denied a family history of mental health related concerns. Tammy Brennan reported there is no history of trauma including psychological, physical  and sexual abuse, as well as neglect.   Tammy Brennan described her typical mood lately as "good," but added she is feeling "grumpy" at times since starting the diet. Aside from concerns noted above and endorsed on the PHQ-9, Tammy Brennan reported experiencing decreased motivation to exercise and feeling tired. Tammy Brennan denied current alcohol use. She denied tobacco use. She denied illicit/recreational substance use. Regarding caffeine intake, Cynthya reported consuming Coke or sweet tea occasionally, adding the last time was before she started with the clinic. Furthermore, Tammy Brennan indicated she is not experiencing the following: hallucinations and delusions, paranoia, symptoms of mania , social withdrawal, crying spells and panic attacks. She also denied history of and current suicidal ideation, plan, and intent; history of and current homicidal ideation, plan, and intent; and history of and current engagement in self-harm.  The following strengths were reported by Tammy Brennan: good at job and get along well with others. The following strengths were observed by this provider: ability to express thoughts and feelings during the therapeutic session, ability to establish and benefit from a therapeutic relationship, willingness to work toward established goal(s) with the clinic and ability to engage in reciprocal conversation.  Legal History: Jonee reported there is no history of legal involvement.   Structured Assessments Results: The Patient Health Questionnaire-9 (PHQ-9) is a self-report measure that assesses symptoms and severity of depression over the course of the last two weeks. Betheny obtained a score of 6 suggesting mild depression. Koda finds the endorsed symptoms to be not difficult  at all. [0= Not at all; 1= Several days; 2= More than half the days; 3= Nearly every day] Little interest or pleasure in doing things 0  Feeling down, depressed, or hopeless 0  Trouble falling or staying asleep, or sleeping too much 3  Feeling tired or having little energy 3  Poor appetite or overeating 0  Feeling bad about yourself --- or that you are a failure or have let yourself or your family down 0  Trouble concentrating on things, such as reading the newspaper or watching television 0  Moving or speaking so slowly that other people could have noticed? Or the opposite --- being so fidgety or restless that you have been moving around a lot more than usual 0  Thoughts that you would be better off dead or hurting yourself in some way 0  PHQ-9 Score 6    The Generalized Anxiety Disorder-7 (GAD-7) is a brief self-report measure that assesses symptoms of anxiety over the course of the last two weeks. Rose obtained a score of 0. [0= Not at all; 1= Several days; 2= Over half the days; 3= Nearly every day] Feeling nervous, anxious, on edge 0  Not being able to stop or control worrying 0  Worrying too much about different things 0  Trouble relaxing 0  Being so restless that it's hard to sit still 0  Becoming easily annoyed or irritable 0  Feeling afraid as if something awful might happen 0  GAD-7 Score 0   Interventions:  Conducted a chart review Focused on rapport building Verbally administered PHQ-9 and GAD-7 for symptom monitoring Verbally administered Food & Mood questionnaire to assess various behaviors related to emotional eating. Provided emphatic reflections and validation Collaborated with patient on a treatment goal  Psychoeducation provided regarding physical  versus emotional hunger  Provisional DSM-5 Diagnosis(es): 311 (F32.8) Other Specified Depressive Disorder, Emotional Eating Behaviors  Plan: Lucette appears able and willing to participate as evidenced by  collaboration on a treatment goal, engagement in reciprocal conversation, and asking questions as needed for clarification. The next appointment will be scheduled in approximately two weeks, which will be via [employer]. The following treatment goal was established: increase coping skills. This provider will regularly review the treatment plan and medical chart to keep informed of status changes. Cystal expressed understanding and agreement with the initial treatment plan of care. Esabella will be sent a handout via e-mail to utilize between now and the next appointment to increase awareness of hunger patterns and subsequent eating. Tammy Brennan provided verbal consent during today's appointment for this provider to send the handout via e-mail.

## 2019-05-21 LAB — CBC WITH DIFFERENTIAL/PLATELET
Basophils Absolute: 0 10*3/uL (ref 0.0–0.2)
Basos: 0 %
EOS (ABSOLUTE): 0.3 10*3/uL (ref 0.0–0.4)
Eos: 3 %
Hematocrit: 38.3 % (ref 34.0–46.6)
Hemoglobin: 13.1 g/dL (ref 11.1–15.9)
Immature Grans (Abs): 0 10*3/uL (ref 0.0–0.1)
Immature Granulocytes: 0 %
Lymphocytes Absolute: 2.5 10*3/uL (ref 0.7–3.1)
Lymphs: 22 %
MCH: 31.2 pg (ref 26.6–33.0)
MCHC: 34.2 g/dL (ref 31.5–35.7)
MCV: 91 fL (ref 79–97)
Monocytes Absolute: 0.5 10*3/uL (ref 0.1–0.9)
Monocytes: 5 %
Neutrophils Absolute: 7.7 10*3/uL — ABNORMAL HIGH (ref 1.4–7.0)
Neutrophils: 70 %
Platelets: 470 10*3/uL — ABNORMAL HIGH (ref 150–450)
RBC: 4.2 x10E6/uL (ref 3.77–5.28)
RDW: 12.6 % (ref 11.7–15.4)
WBC: 11.1 10*3/uL — ABNORMAL HIGH (ref 3.4–10.8)

## 2019-05-21 LAB — COMPREHENSIVE METABOLIC PANEL
ALT: 42 IU/L — ABNORMAL HIGH (ref 0–32)
AST: 41 IU/L — ABNORMAL HIGH (ref 0–40)
Albumin/Globulin Ratio: 1.8 (ref 1.2–2.2)
Albumin: 4.3 g/dL (ref 3.8–4.8)
Alkaline Phosphatase: 76 IU/L (ref 39–117)
BUN/Creatinine Ratio: 18 (ref 9–23)
BUN: 12 mg/dL (ref 6–24)
Bilirubin Total: 0.4 mg/dL (ref 0.0–1.2)
CO2: 25 mmol/L (ref 20–29)
Calcium: 9.8 mg/dL (ref 8.7–10.2)
Chloride: 99 mmol/L (ref 96–106)
Creatinine, Ser: 0.67 mg/dL (ref 0.57–1.00)
GFR calc Af Amer: 123 mL/min/{1.73_m2} (ref >59)
GFR calc non Af Amer: 106 mL/min/{1.73_m2} (ref >59)
Globulin, Total: 2.4 g/dL (ref 1.5–4.5)
Glucose: 95 mg/dL (ref 65–99)
Potassium: 4.2 mmol/L (ref 3.5–5.2)
Sodium: 139 mmol/L (ref 134–144)
Total Protein: 6.7 g/dL (ref 6.0–8.5)

## 2019-05-21 LAB — LIPID PANEL WITH LDL/HDL RATIO
Cholesterol, Total: 199 mg/dL (ref 100–199)
HDL: 71 mg/dL (ref >39)
LDL Chol Calc (NIH): 97 mg/dL (ref 0–99)
LDL/HDL Ratio: 1.4 ratio (ref 0.0–3.2)
Triglycerides: 185 mg/dL — ABNORMAL HIGH (ref 0–149)
VLDL Cholesterol Cal: 31 mg/dL (ref 5–40)

## 2019-05-21 LAB — FOLATE: Folate: >20 ng/mL (ref >3.0)

## 2019-05-21 LAB — VITAMIN B12: Vitamin B-12: 302 pg/mL (ref 232–1245)

## 2019-05-21 LAB — INSULIN, RANDOM: INSULIN: 10.1 u[IU]/mL (ref 2.6–24.9)

## 2019-05-21 LAB — VITAMIN D 25 HYDROXY (VIT D DEFICIENCY, FRACTURES): Vit D, 25-Hydroxy: 55.4 ng/mL (ref 30.0–100.0)

## 2019-05-25 ENCOUNTER — Encounter: Payer: Self-pay | Admitting: Pulmonary Disease

## 2019-05-25 ENCOUNTER — Other Ambulatory Visit: Payer: Self-pay

## 2019-05-25 ENCOUNTER — Ambulatory Visit (INDEPENDENT_AMBULATORY_CARE_PROVIDER_SITE_OTHER): Payer: BC Managed Care – PPO | Admitting: Psychology

## 2019-05-25 ENCOUNTER — Ambulatory Visit (INDEPENDENT_AMBULATORY_CARE_PROVIDER_SITE_OTHER): Payer: BC Managed Care – PPO | Admitting: Pulmonary Disease

## 2019-05-25 VITALS — BP 104/64 | HR 82 | Temp 97.3°F | Ht 62.0 in | Wt 207.8 lb

## 2019-05-25 DIAGNOSIS — G4733 Obstructive sleep apnea (adult) (pediatric): Secondary | ICD-10-CM

## 2019-05-25 NOTE — Patient Instructions (Signed)
Moderate probability of significant obstructive sleep apnea  We will schedule you for home sleep study  Treatment options as discussed   Sleep Apnea Sleep apnea is a condition in which breathing pauses or becomes shallow during sleep. Episodes of sleep apnea usually last 10 seconds or longer, and they may occur as many as 20 times an hour. Sleep apnea disrupts your sleep and keeps your body from getting the rest that it needs. This condition can increase your risk of certain health problems, including:  Heart attack.  Stroke.  Obesity.  Diabetes.  Heart failure.  Irregular heartbeat. What are the causes? There are three kinds of sleep apnea:  Obstructive sleep apnea. This kind is caused by a blocked or collapsed airway.  Central sleep apnea. This kind happens when the part of the brain that controls breathing does not send the correct signals to the muscles that control breathing.  Mixed sleep apnea. This is a combination of obstructive and central sleep apnea. The most common cause of this condition is a collapsed or blocked airway. An airway can collapse or become blocked if:  Your throat muscles are abnormally relaxed.  Your tongue and tonsils are larger than normal.  You are overweight.  Your airway is smaller than normal. What increases the risk? You are more likely to develop this condition if you:  Are overweight.  Smoke.  Have a smaller than normal airway.  Are elderly.  Are female.  Drink alcohol.  Take sedatives or tranquilizers.  Have a family history of sleep apnea. What are the signs or symptoms? Symptoms of this condition include:  Trouble staying asleep.  Daytime sleepiness and tiredness.  Irritability.  Loud snoring.  Morning headaches.  Trouble concentrating.  Forgetfulness.  Decreased interest in sex.  Unexplained sleepiness.  Mood swings.  Personality changes.  Feelings of depression.  Waking up often during the night  to urinate.  Dry mouth.  Sore throat. How is this diagnosed? This condition may be diagnosed with:  A medical history.  A physical exam.  A series of tests that are done while you are sleeping (sleep study). These tests are usually done in a sleep lab, but they may also be done at home. How is this treated? Treatment for this condition aims to restore normal breathing and to ease symptoms during sleep. It may involve managing health issues that can affect breathing, such as high blood pressure or obesity. Treatment may include:  Sleeping on your side.  Using a decongestant if you have nasal congestion.  Avoiding the use of depressants, including alcohol, sedatives, and narcotics.  Losing weight if you are overweight.  Making changes to your diet.  Quitting smoking.  Using a device to open your airway while you sleep, such as: ? An oral appliance. This is a custom-made mouthpiece that shifts your lower jaw forward. ? A continuous positive airway pressure (CPAP) device. This device blows air through a mask when you breathe out (exhale). ? A nasal expiratory positive airway pressure (EPAP) device. This device has valves that you put into each nostril. ? A bi-level positive airway pressure (BPAP) device. This device blows air through a mask when you breathe in (inhale) and breathe out (exhale).  Having surgery if other treatments do not work. During surgery, excess tissue is removed to create a wider airway. It is important to get treatment for sleep apnea. Without treatment, this condition can lead to:  High blood pressure.  Coronary artery disease.  In men, an  inability to achieve or maintain an erection (impotence).  Reduced thinking abilities. Follow these instructions at home: Lifestyle  Make any lifestyle changes that your health care provider recommends.  Eat a healthy, well-balanced diet.  Take steps to lose weight if you are overweight.  Avoid using  depressants, including alcohol, sedatives, and narcotics.  Do not use any products that contain nicotine or tobacco, such as cigarettes, e-cigarettes, and chewing tobacco. If you need help quitting, ask your health care provider. General instructions  Take over-the-counter and prescription medicines only as told by your health care provider.  If you were given a device to open your airway while you sleep, use it only as told by your health care provider.  If you are having surgery, make sure to tell your health care provider you have sleep apnea. You may need to bring your device with you.  Keep all follow-up visits as told by your health care provider. This is important. Contact a health care provider if:  The device that you received to open your airway during sleep is uncomfortable or does not seem to be working.  Your symptoms do not improve.  Your symptoms get worse. Get help right away if:  You develop: ? Chest pain. ? Shortness of breath. ? Discomfort in your back, arms, or stomach.  You have: ? Trouble speaking. ? Weakness on one side of your body. ? Drooping in your face. These symptoms may represent a serious problem that is an emergency. Do not wait to see if the symptoms will go away. Get medical help right away. Call your local emergency services (911 in the [place] Do not drive yourself to the hospital. Summary  Sleep apnea is a condition in which breathing pauses or becomes shallow during sleep.  The most common cause is a collapsed or blocked airway.  The goal of treatment is to restore normal breathing and to ease symptoms during sleep. This information is not intended to replace advice given to you by your health care provider. Make sure you discuss any questions you have with your health care provider. Document Revised: 08/12/2018 Document Reviewed: 10/21/2017 Elsevier Patient Education  [employer].   I will follow up with you in the office in  about 3 months  Call with significant concerns

## 2019-05-25 NOTE — Progress Notes (Signed)
Subjective:    Patient ID: Tammy Brennan, female    DOB: 1975/04/23, 44 y.o.   MRN: [id number]  Patient being seen for nonrestorative sleep, multiple awakenings  History of snoring No witnessed apneas She does admit to dryness of her throat in the mornings No morning headaches Admits to night sweats No thyroid problems  Never smoker Memory is poor lately  Dad does know  Usually goes to bed about 9 PM Falls asleep easily Wakes up about once or twice a night  Final awakening time between 6 and 6:20 AM  She has gained a lot of weight recently about 40 to 50 pounds  Past Medical History:  Diagnosis Date  . Carpal tunnel syndrome 06/20/2014  . Dysrhythmia    PAPLITATIONS  . Essential hypertension 06/10/2017  . Headache    HX  MIGRAINES  . Hyperlipidemia, mixed 10/10/2014  . Obesity 10/10/2014  . PONV (postoperative nausea and vomiting)   . Preventative health care 09/27/2014   Sees Dr Hilbert Corrigan for GYN  . Wart 10/10/2014    Social History   Socioeconomic History  . Marital status: Married    Spouse name: Not on file  . Number of children: Not on file  . Years of education: Not on file  . Highest education level: Not on file  Occupational History  . Occupation: Research scientist (life sciences)  Tobacco Use  . Smoking status: Never Smoker  . Smokeless tobacco: Never Used  Substance and Sexual Activity  . Alcohol use: No    Alcohol/week: 0.0 standard drinks  . Drug use: No  . Sexual activity: Yes    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, no dietary restrictions, Acupuncturist at [employer]  Other Topics Concern  . Not on file  Social History Narrative  . Not on file   Social Determinants of Health   Financial Resource Strain:   . Difficulty of Paying Living Expenses:   Food Insecurity:   . Worried About Charity fundraiser in the Last Year:   . Arboriculturist in the Last Year:   Transportation Needs:   . Film/video editor (Medical):   Marland Kitchen Lack of Transportation  (Non-Medical):   Physical Activity:   . Days of Exercise per Week:   . Minutes of Exercise per Session:   Stress:   . Feeling of Stress :   Social Connections:   . Frequency of Communication with Friends and Family:   . Frequency of Social Gatherings with Friends and Family:   . Attends Religious Services:   . Active Member of Clubs or Organizations:   . Attends Archivist Meetings:   Marland Kitchen Marital Status:   Intimate Partner Violence:   . Fear of Current or Ex-Partner:   . Emotionally Abused:   Marland Kitchen Physically Abused:   . Sexually Abused:    Family History  Problem Relation Age of Onset  . Hypertension Mother   . Hyperlipidemia Mother   . Hypertension Father   . Cancer Father        lung, previous light smoker  . Alcoholism Father   . Alcohol abuse Maternal Grandfather   . Heart disease Maternal Grandfather        MI  . Dementia Paternal Grandfather   . Hypertension Sister       Review of Systems  Constitutional: Positive for unexpected weight change. Negative for fever.  HENT: Negative for congestion, dental problem, ear pain, nosebleeds, postnasal drip, rhinorrhea, sinus pressure, sneezing,  sore throat and trouble swallowing.   Eyes: Negative for redness and itching.  Respiratory: Positive for shortness of breath. Negative for cough, chest tightness and wheezing.   Cardiovascular: Negative for palpitations and leg swelling.  Gastrointestinal: Negative for nausea and vomiting.  Genitourinary: Positive for dysuria.  Musculoskeletal: Negative for joint swelling.  Skin: Negative for rash.  Allergic/Immunologic: Negative.  Negative for environmental allergies, food allergies and immunocompromised state.  Neurological: Positive for headaches.  Hematological: Does not bruise/bleed easily.  Psychiatric/Behavioral: Negative for dysphoric mood. The patient is not nervous/anxious.        Objective:   Physical Exam Constitutional:      Appearance: She is obese.    HENT:     Head: Normocephalic and atraumatic.     Nose: No congestion.     Mouth/Throat:     Mouth: Mucous membranes are moist.     Comments: Mallampati 3, crowded oropharynx Eyes:     Extraocular Movements: Extraocular movements intact.     Pupils: Pupils are equal, round, and reactive to light.  Cardiovascular:     Rate and Rhythm: Normal rate and regular rhythm.     Pulses: Normal pulses.     Heart sounds: No murmur. No friction rub.  Pulmonary:     Effort: Pulmonary effort is normal. No respiratory distress.     Breath sounds: Normal breath sounds. No stridor. No wheezing or rhonchi.  Musculoskeletal:     Cervical back: Normal range of motion and neck supple. No rigidity or tenderness.  Neurological:     General: No focal deficit present.     Mental Status: She is alert.  Psychiatric:        Mood and Affect: Mood normal.    Vitals:   05/25/19 1331  BP: 104/64  Pulse: 82  Temp: (!) 97.3 F (36.3 C)  SpO2: 99%   Results of the Epworth flowsheet 05/25/2019  Sitting and reading 2  Watching TV 1  Sitting, inactive in a public place (e.g. a theatre or a meeting) 2  As a passenger in a car for an hour without a break 3  Lying down to rest in the afternoon when circumstances permit 3  Sitting and talking to someone 0  Sitting quietly after a lunch without alcohol 2  In a car, while stopped for a few minutes in traffic 0  Total score 13      Assessment & Plan:  Moderate probability of significant obstructive sleep apnea  Excessive daytime sleepiness  Obesity  Nonrestorative sleep  Pathophysiology of sleep disordered breathing discussed with the patient Treatment options for sleep disordered breathing discussed with the patient  Plan: We will schedule patient for home sleep study  Importance of weight management discussed with patient  Regular exercises discussed  I will see the patient in about 3 months  Encouraged to call with any significant concerns

## 2019-05-26 ENCOUNTER — Encounter: Payer: Self-pay | Admitting: Family Medicine

## 2019-05-26 ENCOUNTER — Ambulatory Visit (INDEPENDENT_AMBULATORY_CARE_PROVIDER_SITE_OTHER): Payer: BC Managed Care – PPO | Admitting: Psychology

## 2019-05-26 DIAGNOSIS — F3289 Other specified depressive episodes: Secondary | ICD-10-CM | POA: Diagnosis not present

## 2019-05-27 NOTE — Progress Notes (Signed)
  Office: [phone]  /  Fax: [phone]    Date: June 10, 2019   Appointment Start Time: 9:59am Duration: 27 minutes Provider: Lawerance Cruel, Psy.D. Type of Session: Individual Therapy  Location of Patient: Home Location of Provider: Provider's Home Type of Contact: Telepsychological Visit via [employer]  Session Content: Tammy Brennan is a 44 y.o. female presenting via [employer] Web[employer] for a follow-up appointment to address the previously established treatment goal of increasing coping skills. Today's appointment was a telepsychological visit due to COVID-19. Tammy Brennan provided verbal consent for today's telepsychological appointment and she is aware she is responsible for securing confidentiality on her end of the session. Prior to proceeding with today's appointment, Tammy Brennan's physical location at the time of this appointment was obtained as well a phone number she could be reached at in the event of technical difficulties. Tammy Brennan and this provider participated in today's telepsychological service.   This provider conducted a brief check-in. Tammy Brennan shared about her recent vacation and upcoming camping trip. She also shared about her recent weight loss. Emotional and physical hunger was reviewed. Psychoeducation regarding triggers for emotional eating was provided. Tammy Brennan was provided a handout, and encouraged to utilize the handout between now and the next appointment to increase awareness of triggers and frequency. Tammy Brennan agreed. This provider also discussed behavioral strategies for specific triggers, such as placing the utensil down when conversing to avoid mindless eating. Tammy Brennan provided verbal consent during today's appointment for this provider to send a handout about triggers via e-mail. Tammy Brennan was receptive to today's appointment as evidenced by openness to sharing, responsiveness to feedback, and willingness to explore triggers for emotional eating.  Mental Status Examination:    Appearance: well groomed and appropriate hygiene  Behavior: appropriate to circumstances Mood: euthymic Affect: mood congruent Speech: normal in rate, volume, and tone Eye Contact: appropriate Psychomotor Activity: appropriate Gait: unable to assess Thought Process: linear, logical, and goal directed  Thought Content/Perception: no hallucinations, delusions, bizarre thinking or behavior reported or observed and no evidence of suicidal and homicidal ideation, plan, and intent Orientation: time, person, place and purpose of appointment Memory/Concentration: memory, attention, language, and fund of knowledge intact  Insight/Judgment: good  Interventions:  Conducted a brief chart review Provided empathic reflections and validation Reviewed content from the previous session Employed supportive psychotherapy interventions to facilitate reduced distress and to improve coping skills with identified stressors Employed motivational interviewing skills to assess patient's willingness/desire to adhere to recommended medical treatments and assignments Psychoeducation provided regarding triggers for emotional eating  DSM-5 Diagnosis(es): 311 (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 Tammy Brennan has demonstrated progress in her goal as evidenced by increased awareness of hunger patterns.   Plan: Due to recent progress, the next appointment will be scheduled in approximately three weeks, which will be via MyChart Video Visit. The next session will focus on working towards the established treatment goal.

## 2019-05-28 ENCOUNTER — Other Ambulatory Visit: Payer: Self-pay

## 2019-05-28 ENCOUNTER — Encounter: Payer: Self-pay | Admitting: Family Medicine

## 2019-05-31 ENCOUNTER — Other Ambulatory Visit (INDEPENDENT_AMBULATORY_CARE_PROVIDER_SITE_OTHER): Payer: BC Managed Care – PPO

## 2019-05-31 ENCOUNTER — Other Ambulatory Visit: Payer: Self-pay

## 2019-05-31 DIAGNOSIS — R7989 Other specified abnormal findings of blood chemistry: Secondary | ICD-10-CM

## 2019-05-31 LAB — CBC
HCT: 35.7 % — ABNORMAL LOW (ref 36.0–46.0)
Hemoglobin: 12.4 g/dL (ref 12.0–15.0)
MCHC: 34.6 g/dL (ref 30.0–36.0)
MCV: 90.5 fl (ref 78.0–100.0)
Platelets: 391 10*3/uL (ref 150.0–400.0)
RBC: 3.95 Mil/uL (ref 3.87–5.11)
RDW: 13 % (ref 11.5–15.5)
WBC: 6.7 10*3/uL (ref 4.0–10.5)

## 2019-06-01 ENCOUNTER — Other Ambulatory Visit: Payer: BC Managed Care – PPO

## 2019-06-03 ENCOUNTER — Other Ambulatory Visit: Payer: Self-pay

## 2019-06-03 ENCOUNTER — Ambulatory Visit (INDEPENDENT_AMBULATORY_CARE_PROVIDER_SITE_OTHER): Payer: BC Managed Care – PPO | Admitting: Family Medicine

## 2019-06-03 ENCOUNTER — Encounter (INDEPENDENT_AMBULATORY_CARE_PROVIDER_SITE_OTHER): Payer: Self-pay | Admitting: Family Medicine

## 2019-06-03 VITALS — BP 103/72 | HR 62 | Temp 97.9°F | Ht 63.0 in | Wt 201.0 lb

## 2019-06-03 DIAGNOSIS — R7303 Prediabetes: Secondary | ICD-10-CM | POA: Diagnosis not present

## 2019-06-03 DIAGNOSIS — E66812 Obesity, class 2: Secondary | ICD-10-CM

## 2019-06-03 DIAGNOSIS — I10 Essential (primary) hypertension: Secondary | ICD-10-CM | POA: Diagnosis not present

## 2019-06-03 DIAGNOSIS — R7989 Other specified abnormal findings of blood chemistry: Secondary | ICD-10-CM | POA: Diagnosis not present

## 2019-06-03 DIAGNOSIS — Z6835 Body mass index (BMI) 35.0-35.9, adult: Secondary | ICD-10-CM

## 2019-06-03 DIAGNOSIS — Z9189 Other specified personal risk factors, not elsewhere classified: Secondary | ICD-10-CM | POA: Diagnosis not present

## 2019-06-03 MED ORDER — METFORMIN HCL 500 MG PO TABS: 500.0000 mg | ORAL_TABLET | Freq: Every morning | ORAL | 0 refills | Status: DC

## 2019-06-07 NOTE — Progress Notes (Signed)
Chief Complaint:   OBESITY Tammy Brennan is here to discuss her progress with her obesity treatment plan along with follow-up of her obesity related diagnoses. Tammy Brennan is on the Category 2 Plan + 100 calories and states she is following her eating plan approximately 100% of the time. Tammy Brennan states she is doing 0 minutes 0 times per week.  Today's visit was #: 2 Starting weight: 206 lbs Starting date: 05/20/2019 Today's weight: 201 lbs Today's date: 06/03/2019 Total lbs lost to date: 5 Total lbs lost since last in-office visit: 5  Interim History: Tammy Brennan felt time compressed when consuming her breakfast meal. She will be traveling next week for Spring break, and we discussed eating strategies during travel and camping.  Subjective:   1. Pre-diabetes Tammy Brennan's fasting insulin was 10.1 on 05/20/2019 and A1c was 5.9 on 05/03/2019. Her GFR is 106. I discussed labs with the patient today.  2. Essential hypertension Tammy Brennan's blood pressure is well controlled on losartan-hydrochlorothiazide 100-12.5 mg q daily, and hydrochlorothiazide 12.5 q daily. Her CMP, electrolytes, and GFR were stable on 05/20/2019. I discussed labs with the patient today.  3. Elevated LFTs Tammy Brennan's AST was 41 and ALT 42 on 05/20/2019. She is not on statin therapy and she denies ETOH use. I discussed labs with the patient today.  4. At risk for diarrhea Tammy Brennan is at higher risk of diarrhea due to starting metformin to treat pre-diabetes.  Assessment/Plan:   1. Pre-diabetes Tammy Brennan will continue her Category 2 meal plan, and will continue to work on weight loss, exercise, and decreasing simple carbohydrates to help decrease the risk of diabetes. Rickelle agreed to start metformin 500 mg q daily with no refills.  - metFORMIN (GLUCOPHAGE) 500 MG tablet; Take 1 tablet (500 mg total) by mouth every morning.  Dispense: 30 tablet; Refill: 0  2. Essential hypertension Tammy Brennan will continue her Category 2 meal  plan, and will continue to work on healthy weight loss and exercise to improve blood pressure control. We will watch for signs of hypotension as she continues her lifestyle modifications. Tammy Brennan agreed to continue her current anti-hypertensive therapy.  3. Elevated LFTs We discussed the likely diagnosis of non-alcoholic fatty liver disease today and how this condition is obesity related. Tammy Brennan was educated the importance of weight loss. Tammy Brennan will continue her Category 2 meal plan, and will continue with her weight loss efforts, and exercise as an essential part of her treatment plan. We will recheck labs in 3 months.  4. At risk for diarrhea Tammy Brennan was given approximately 30 minutes of diarrhea prevention counseling today. She is 44 y.o. female and has risk factors for diarrhea including medications and changes in diet. We discussed intensive lifestyle modifications today with an emphasis on specific weight loss instructions including dietary strategies.   Repetitive spaced learning was employed today to elicit superior memory formation and behavioral change.  5. Class 2 severe obesity with serious comorbidity and body mass index (BMI) of 35.0 to 35.9 in adult, unspecified obesity type Tammy Brennan) Tammy Brennan is currently in the action stage of change. As such, her goal is to continue with weight loss efforts. She has agreed to the Category 2 Plan.   Exercise goals: No exercise has been prescribed at this time.  Behavioral modification strategies: increasing lean protein intake, increasing vegetables, no skipping meals and travel eating strategies.  Tammy Brennan has agreed to follow-up with our clinic in 2 weeks. She was informed of the importance of frequent follow-up visits to maximize her success  with intensive lifestyle modifications for her multiple health conditions.   Objective:   Blood pressure 103/72, pulse 62, temperature 97.9 F (36.6 C), temperature source Oral, height 5\' 3"  (1.6 m),  weight 201 lb (91.2 kg), SpO2 99 %. Body mass index is 35.61 kg/m.  General: Cooperative, alert, well developed, in no acute distress. HEENT: Conjunctivae and lids unremarkable. Cardiovascular: Regular rhythm.  Lungs: Normal work of breathing. Neurologic: No focal deficits.   Lab Results  Component Value Date   CREATININE 0.67 05/20/2019   BUN 12 05/20/2019   NA 139 05/20/2019   K 4.2 05/20/2019   CL 99 05/20/2019   CO2 25 05/20/2019   Lab Results  Component Value Date   ALT 42 (H) 05/20/2019   AST 41 (H) 05/20/2019   ALKPHOS 76 05/20/2019   BILITOT 0.4 05/20/2019   Lab Results  Component Value Date   HGBA1C 5.9 05/03/2019   HGBA1C 5.8 06/21/2016   Lab Results  Component Value Date   INSULIN 10.1 05/20/2019   Lab Results  Component Value Date   TSH 1.72 05/03/2019   Lab Results  Component Value Date   CHOL 199 05/20/2019   HDL 71 05/20/2019   LDLCALC 97 05/20/2019   LDLDIRECT 96.0 05/03/2019   TRIG 185 (H) 05/20/2019   CHOLHDL 3 05/03/2019   Lab Results  Component Value Date   WBC 6.7 05/31/2019   HGB 12.4 05/31/2019   HCT 35.7 (L) 05/31/2019   MCV 90.5 05/31/2019   PLT 391.0 05/31/2019   No results found for: IRON, TIBC, FERRITIN  Attestation Statements:   Reviewed by clinician on day of visit: allergies, medications, problem list, medical history, surgical history, family history, social history, and previous encounter notes.   I, Trixie Dredge, am acting as transcriptionist for Dennard Nip, MD.  I have reviewed the above documentation for accuracy and completeness, and I agree with the above. -  Dennard Nip, MD

## 2019-06-10 ENCOUNTER — Ambulatory Visit (INDEPENDENT_AMBULATORY_CARE_PROVIDER_SITE_OTHER): Payer: BC Managed Care – PPO | Admitting: Psychology

## 2019-06-10 ENCOUNTER — Other Ambulatory Visit: Payer: Self-pay

## 2019-06-10 DIAGNOSIS — F3289 Other specified depressive episodes: Secondary | ICD-10-CM | POA: Diagnosis not present

## 2019-06-14 NOTE — Telephone Encounter (Signed)
Hi [facility]! Pt had a home sleep test ordered on 05/25/2019, and is messaging back asking when she will be contacted to be scheduled for this home sleep test.  Can you all help with this?  Thanks!

## 2019-06-14 NOTE — Telephone Encounter (Signed)
Bjorn Loser is still working on [employer].  Will route back to triage so they can make the pt aware thru MyChart.  Pt will be contacted after precert received.

## 2019-06-16 NOTE — Progress Notes (Unsigned)
Office: [phone]  /  Fax: [phone]    Date: June 30, 2019   Appointment Start Time: *** Duration: *** minutes Provider: Lawerance Cruel, Psy.D. Type of Session: Individual Therapy  Location of Patient: {gbptloc:23249} Location of Provider: {Location of Service:22491} Type of Contact: Telepsychological Visit via MyChart Video Visit  Session Content: Tammy Brennan is a 44 y.o. female presenting via MyChart Video Visit for a follow-up appointment to address the previously established treatment goal of increasing coping skills. Today's appointment was a telepsychological visit due to COVID-19. [name] provided verbal consent for today's telepsychological appointment and she is aware she is responsible for securing confidentiality on her end of the session. Prior to proceeding with today's appointment, Laneshia's physical location at the time of this appointment was obtained as well a phone number she could be reached at in the event of technical difficulties. [name] and this provider participated in today's telepsychological service.   Kiyah acknowledged understanding that for today's appointment and future telepsychological appointments, MyChart will be utilized. She also verbally acknowledged understanding that the information outlined in the telepsychological informed consent form signed at the onset of treatment would still be applicable despite the change in the videoconferencing platform.   This provider conducted a brief check-in and verbally administered the PHQ-9 and GAD-7. *** [name] was receptive to today's appointment as evidenced by openness to sharing, responsiveness to feedback, and {gbreceptiveness:23401}.  Mental Status Examination:  Appearance: {Appearance:22431} Behavior: {Behavior:22445} Mood: {gbmood:21757} Affect: {Affect:22436} Speech: {Speech:22432} Eye Contact: {Eye Contact:22433} Psychomotor Activity: {Motor Activity:22434} Gait: {gbgait:23404} Thought  Process: {thought process:22448}  Thought Content/Perception: {disturbances:22451} Orientation: {Orientation:22437} Memory/Concentration: {gbcognition:22449} Insight/Judgment: {Insight:22446}  Structured Assessments Results: The Patient Health Questionnaire-9 (PHQ-9) is a self-report measure that assesses symptoms and severity of depression over the course of the last two weeks. [name] obtained a score of *** suggesting {GBPHQ9SEVERITY:21752}. [name] finds the endorsed symptoms to be {gbphq9difficulty:21754}. [0= Not at all; 1= Several days; 2= More than half the days; 3= Nearly every day] Little interest or pleasure in doing things ***  Feeling down, depressed, or hopeless ***  Trouble falling or staying asleep, or sleeping too much ***  Feeling tired or having little energy ***  Poor appetite or overeating ***  Feeling bad about yourself --- or that you are a failure or have let yourself or your family down ***  Trouble concentrating on things, such as reading the newspaper or watching television ***  Moving or speaking so slowly that other people could have noticed? Or the opposite --- being so fidgety or restless that you have been moving around a lot more than usual ***  Thoughts that you would be better off dead or hurting yourself in some way ***  PHQ-9 Score ***    The Generalized Anxiety Disorder-7 (GAD-7) is a brief self-report measure that assesses symptoms of anxiety over the course of the last two weeks. [name] obtained a score of *** suggesting {gbgad7severity:21753}. [name] finds the endorsed symptoms to be {gbphq9difficulty:21754}. [0= Not at all; 1= Several days; 2= Over half the days; 3= Nearly every day] Feeling nervous, anxious, on edge ***  Not being able to stop or control worrying ***  Worrying too much about different things ***  Trouble relaxing ***  Being so restless that it's hard to sit still ***  Becoming easily annoyed or irritable ***  Feeling afraid  as if something awful might happen ***  GAD-7 Score ***   Interventions:  {Interventions for Progress Notes:23405}  DSM-5 Diagnosis(es): 311 (F32.8) Other  Specified Depressive Disorder, Emotional Eating Behaviors  Treatment Goal & Progress: During the initial appointment with this provider, the following treatment goal was established: increase coping skills. [name] has demonstrated progress in her goal as evidenced by {gbtxprogress:22839}. [name] also {gbtxprogress2:22951}.  Plan: The next appointment will be scheduled in {gbweeks:21758}, which will be {gbtxmodality:23402}. The next session will focus on {Plan for Next Appointment:23400}.

## 2019-06-28 ENCOUNTER — Ambulatory Visit (INDEPENDENT_AMBULATORY_CARE_PROVIDER_SITE_OTHER): Payer: BC Managed Care – PPO | Admitting: Family Medicine

## 2019-06-28 ENCOUNTER — Encounter (INDEPENDENT_AMBULATORY_CARE_PROVIDER_SITE_OTHER): Payer: Self-pay | Admitting: Family Medicine

## 2019-06-28 ENCOUNTER — Other Ambulatory Visit: Payer: Self-pay

## 2019-06-28 VITALS — BP 105/71 | HR 83 | Temp 97.8°F | Ht 63.0 in | Wt 199.0 lb

## 2019-06-28 DIAGNOSIS — R7303 Prediabetes: Secondary | ICD-10-CM | POA: Diagnosis not present

## 2019-06-28 DIAGNOSIS — Z6835 Body mass index (BMI) 35.0-35.9, adult: Secondary | ICD-10-CM

## 2019-06-28 DIAGNOSIS — Z9189 Other specified personal risk factors, not elsewhere classified: Secondary | ICD-10-CM | POA: Diagnosis not present

## 2019-06-28 MED ORDER — METFORMIN HCL 500 MG PO TABS: 1000.0000 mg | ORAL_TABLET | Freq: Every morning | ORAL | 0 refills | Status: DC

## 2019-06-29 NOTE — Progress Notes (Signed)
Chief Complaint:   OBESITY Tammy Brennan is here to discuss her progress with her obesity treatment plan along with follow-up of her obesity related diagnoses. Tammy Brennan is on the Category 2 Plan and states she is following her eating plan approximately 90% of the time. Tammy Brennan states she is walking for 30 minutes 1 time per week.  Today's visit was #: 3 Starting weight: 206 lbs Starting date: 05/20/2019 Today's weight: 199 lbs Today's date: 06/28/2019 Total lbs lost to date: 7 Total lbs lost since last in-office visit: 2  Interim History: Tammy Brennan continues to do well with weight loss on her Category 2 plan. She notes getting bored especially with breakfast and she is struggling with mild morning hunger.  Subjective:   1. Pre-diabetes Tammy Brennan started metformin and she denies nausea or vomiting. She still notes mid morning hunger and has not improved even with metformin.  2. At risk for hypoglycemia Tammy Brennan is at increased risk for hypoglycemia due to pre-diabetes. Tammy Brennan is not currently taking insulin.   Assessment/Plan:   1. Pre-diabetes Tammy Brennan will continue to work on weight loss, exercise, and decreasing simple carbohydrates to help decrease the risk of diabetes. Tammy Brennan agreed to increase metformin to 500 mg 2 tablets q AM with no refills, and will follow up in 2 weeks.  - metFORMIN (GLUCOPHAGE) 500 MG tablet; Take 2 tablets (1,000 mg total) by mouth every morning.  Dispense: 60 tablet; Refill: 0  2. At risk for hypoglycemia Tammy Brennan was given approximately 15 minutes of counseling today regarding prevention of hypoglycemia. She was advised of symptoms of hypoglycemia. Tammy Brennan was instructed to avoid skipping meals, eat regular protein rich meals and schedule low calorie snacks as needed.   Repetitive spaced learning was employed today to elicit superior memory formation and behavioral change  3. Class 2 severe obesity with serious comorbidity and body mass index  (BMI) of 35.0 to 35.9 in adult, unspecified obesity type Tammy Brennan) Tammy Brennan is currently in the action stage of change. As such, her goal is to continue with weight loss efforts. She has agreed to the Category 2 Plan with breakfast options.   Exercise goals: As is.  Behavioral modification strategies: increasing lean protein intake, meal planning and cooking strategies and better snacking choices.  Tammy Brennan has agreed to follow-up with our clinic in 2 weeks. She was informed of the importance of frequent follow-up visits to maximize her success with intensive lifestyle modifications for her multiple health conditions.   Objective:   Blood pressure 105/71, pulse 83, temperature 97.8 F (36.6 C), temperature source Oral, height 5\' 3"  (1.6 m), weight 199 lb (90.3 kg), SpO2 97 %. Body mass index is 35.25 kg/m.  General: Cooperative, alert, well developed, in no acute distress. HEENT: Conjunctivae and lids unremarkable. Cardiovascular: Regular rhythm.  Lungs: Normal work of breathing. Neurologic: No focal deficits.   Lab Results  Component Value Date   CREATININE 0.67 05/20/2019   BUN 12 05/20/2019   NA 139 05/20/2019   K 4.2 05/20/2019   CL 99 05/20/2019   CO2 25 05/20/2019   Lab Results  Component Value Date   ALT 42 (H) 05/20/2019   AST 41 (H) 05/20/2019   ALKPHOS 76 05/20/2019   BILITOT 0.4 05/20/2019   Lab Results  Component Value Date   HGBA1C 5.9 05/03/2019   HGBA1C 5.8 06/21/2016   Lab Results  Component Value Date   INSULIN 10.1 05/20/2019   Lab Results  Component Value Date   TSH 1.72 05/03/2019  Lab Results  Component Value Date   CHOL 199 05/20/2019   HDL 71 05/20/2019   LDLCALC 97 05/20/2019   LDLDIRECT 96.0 05/03/2019   TRIG 185 (H) 05/20/2019   CHOLHDL 3 05/03/2019   Lab Results  Component Value Date   WBC 6.7 05/31/2019   HGB 12.4 05/31/2019   HCT 35.7 (L) 05/31/2019   MCV 90.5 05/31/2019   PLT 391.0 05/31/2019   No results found for: IRON,  TIBC, FERRITIN  Attestation Statements:   Reviewed by clinician on day of visit: allergies, medications, problem list, medical history, surgical history, family history, social history, and previous encounter notes.   I, Burt Knack, am acting as transcriptionist for Quillian Quince, MD.  I have reviewed the above documentation for accuracy and completeness, and I agree with the above. -  Quillian Quince, MD

## 2019-06-30 ENCOUNTER — Telehealth (INDEPENDENT_AMBULATORY_CARE_PROVIDER_SITE_OTHER): Payer: Self-pay | Admitting: Psychology

## 2019-07-04 NOTE — Progress Notes (Signed)
  Office: [phone]  /  Fax: [phone]    Date: Jul 15, 2019   Appointment Start Time: 4:00pm Duration: 22 minutes Provider: Lawerance Cruel, Psy.D. Type of Session: Individual Therapy  Location of Patient: Home Location of Provider: Provider's Home Type of Contact: Telepsychological Visit via MyChart Video Visit  Session Content: Tammy Brennan is a 44 y.o. female presenting via MyChart Video Visit for a follow-up appointment to address the previously established treatment goal of increasing coping skills. Today's appointment was a telepsychological visit due to COVID-19. Tammy Brennan provided verbal consent for today's telepsychological appointment and she is aware she is responsible for securing confidentiality on her end of the session. Prior to proceeding with today's appointment, Tammy Brennan's physical location at the time of this appointment was obtained as well a phone number she could be reached at in the event of technical difficulties. Tammy Brennan and this provider participated in today's telepsychological service.   Tammy Brennan acknowledged understanding that for today's appointment and future telepsychological appointments, MyChart Video Visit will be utilized. She also verbally acknowledged understanding that the information outlined in the telepsychological informed consent form signed at the onset of treatment would still be applicable despite the change in the videoconferencing platform.   This provider conducted a brief check-in. Tammy Brennan shared she has been busy and stressed resulting in emotional eating. Psychoeducation regarding the importance of self-care utilizing the oxygen mask metaphor was provided. Additionally, psychoeducation regarding pleasurable activities, including its impact on emotional eating and overall well-being was provided. Tammy Brennan was provided with a handout with various options of pleasurable activities, and was encouraged to engage in one activity a day and additional  activities as needed when triggered to emotionally eat. Tammy Brennan agreed. Tammy Brennan provided verbal consent during today's appointment for this provider to send a handout about pleasurable activities via e-mail. Tammy Brennan was receptive to today's appointment as evidenced by openness to sharing, responsiveness to feedback, and willingness to engage in pleasurable activities to assist with coping.  Mental Status Examination:  Appearance: well groomed and appropriate hygiene  Behavior: appropriate to circumstances Mood: euthymic Affect: mood congruent Speech: normal in rate, volume, and tone Eye Contact: appropriate Psychomotor Activity: appropriate Gait: unable to assess Thought Process: linear, logical, and goal directed  Thought Content/Perception: no hallucinations, delusions, bizarre thinking or behavior reported or observed and no evidence of suicidal and homicidal ideation, plan, and intent Orientation: time, person, place and purpose of appointment Memory/Concentration: memory, attention, language, and fund of knowledge intact  Insight/Judgment: good   Interventions:  Conducted a brief chart review Provided empathic reflections and validation Employed supportive psychotherapy interventions to facilitate reduced distress and to improve coping skills with identified stressors Employed motivational interviewing skills to assess patient's willingness/desire to adhere to recommended medical treatments and assignments Psychoeducation provided regarding pleasurable activities Psychoeducation provided regarding self-care  DSM-5 Diagnosis(es): 311 (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 Tammy Brennan has demonstrated progress in her goal as evidenced by increased awareness of hunger patterns and increased awareness of  triggers for emotional eating. Tammy Brennan also demonstrates willingness to engage in pleasurable activities.  Plan: The next appointment will be scheduled in approximately two weeks, which will be via MyChart Video Visit. The next session will focus on working towards the established treatment goal.

## 2019-07-12 ENCOUNTER — Ambulatory Visit (INDEPENDENT_AMBULATORY_CARE_PROVIDER_SITE_OTHER): Payer: BC Managed Care – PPO | Admitting: Family Medicine

## 2019-07-13 DIAGNOSIS — L821 Other seborrheic keratosis: Secondary | ICD-10-CM | POA: Diagnosis not present

## 2019-07-13 DIAGNOSIS — L918 Other hypertrophic disorders of the skin: Secondary | ICD-10-CM | POA: Diagnosis not present

## 2019-07-13 DIAGNOSIS — D485 Neoplasm of uncertain behavior of skin: Secondary | ICD-10-CM | POA: Diagnosis not present

## 2019-07-13 DIAGNOSIS — D229 Melanocytic nevi, unspecified: Secondary | ICD-10-CM | POA: Diagnosis not present

## 2019-07-15 ENCOUNTER — Telehealth (INDEPENDENT_AMBULATORY_CARE_PROVIDER_SITE_OTHER): Payer: BC Managed Care – PPO | Admitting: Psychology

## 2019-07-15 ENCOUNTER — Other Ambulatory Visit: Payer: Self-pay

## 2019-07-15 DIAGNOSIS — F3289 Other specified depressive episodes: Secondary | ICD-10-CM

## 2019-07-19 NOTE — Progress Notes (Unsigned)
Office: [phone]  /  Fax: [phone]    Date: Aug 02, 2019   Appointment Start Time: *** Duration: *** minutes Provider: Lawerance Cruel, Psy.D. Type of Session: Individual Therapy  Location of Patient: {gbptloc:23249} Location of Provider: Provider's Home Type of Contact: Telepsychological Visit via MyChart Video Visit  Session Content: Tammy Brennan is a 44 y.o. female presenting via MyChart Video Visit for a follow-up appointment to address the previously established treatment goal of increasing coping skills. Today's appointment was a telepsychological visit due to COVID-19. Tammy Brennan provided verbal consent for today's telepsychological appointment and she is aware she is responsible for securing confidentiality on her end of the session. Prior to proceeding with today's appointment, Tammy Brennan's physical location at the time of this appointment was obtained as well a phone number she could be reached at in the event of technical difficulties. Tammy Brennan and this provider participated in today's telepsychological service.   This provider conducted a brief check-in and verbally administered the PHQ-9 and GAD-7. *** Tammy Brennan was receptive to today's appointment as evidenced by openness to sharing, responsiveness to feedback, and {gbreceptiveness:23401}.  Mental Status Examination:  Appearance: {Appearance:22431} Behavior: {Behavior:22445} Mood: {gbmood:21757} Affect: {Affect:22436} Speech: {Speech:22432} Eye Contact: {Eye Contact:22433} Psychomotor Activity: {Motor Activity:22434} Gait: {gbgait:23404} Thought Process: {thought process:22448}  Thought Content/Perception: {disturbances:22451} Orientation: {Orientation:22437} Memory/Concentration: {gbcognition:22449} Insight/Judgment: {Insight:22446}  Structured Assessments Results: The Patient Health Questionnaire-9 (PHQ-9) is a self-report measure that assesses symptoms and severity of depression over the course of the last two weeks.  Tammy Brennan obtained a score of *** suggesting {GBPHQ9SEVERITY:21752}. Tammy Brennan finds the endorsed symptoms to be {gbphq9difficulty:21754}. [0= Not at all; 1= Several days; 2= More than half the days; 3= Nearly every day] Little interest or pleasure in doing things ***  Feeling down, depressed, or hopeless ***  Trouble falling or staying asleep, or sleeping too much ***  Feeling tired or having little energy ***  Poor appetite or overeating ***  Feeling bad about yourself --- or that you are a failure or have let yourself or your family down ***  Trouble concentrating on things, such as reading the newspaper or watching television ***  Moving or speaking so slowly that other people could have noticed? Or the opposite --- being so fidgety or restless that you have been moving around a lot more than usual ***  Thoughts that you would be better off dead or hurting yourself in some way ***  PHQ-9 Score ***    The Generalized Anxiety Disorder-7 (GAD-7) is a brief self-report measure that assesses symptoms of anxiety over the course of the last two weeks. Tammy Brennan obtained a score of *** suggesting {gbgad7severity:21753}. Tammy Brennan finds the endorsed symptoms to be {gbphq9difficulty:21754}. [0= Not at all; 1= Several days; 2= Over half the days; 3= Nearly every day] Feeling nervous, anxious, on edge ***  Not being able to stop or control worrying ***  Worrying too much about different things ***  Trouble relaxing ***  Being so restless that it's hard to sit still ***  Becoming easily annoyed or irritable ***  Feeling afraid as if something awful might happen ***  GAD-7 Score ***   Interventions:  {Interventions for Progress Notes:23405}  DSM-5 Diagnosis(es): 311 (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 Tammy Brennan has  demonstrated progress in her goal as evidenced by {gbtxprogress:22839}. Tammy Brennan also {gbtxprogress2:22951}.  Plan: The next appointment will be scheduled in {  gbweeks:21758}, which will be {gbtxmodality:23402}. The next session will focus on {Plan for Next Appointment:23400}.

## 2019-07-22 ENCOUNTER — Ambulatory Visit (INDEPENDENT_AMBULATORY_CARE_PROVIDER_SITE_OTHER): Payer: BC Managed Care – PPO | Admitting: Family Medicine

## 2019-07-26 ENCOUNTER — Encounter (INDEPENDENT_AMBULATORY_CARE_PROVIDER_SITE_OTHER): Payer: Self-pay | Admitting: Family Medicine

## 2019-07-26 ENCOUNTER — Other Ambulatory Visit: Payer: Self-pay

## 2019-07-26 ENCOUNTER — Ambulatory Visit (INDEPENDENT_AMBULATORY_CARE_PROVIDER_SITE_OTHER): Payer: BC Managed Care – PPO | Admitting: Family Medicine

## 2019-07-26 VITALS — BP 111/72 | HR 80 | Temp 97.8°F | Ht 63.0 in | Wt 199.0 lb

## 2019-07-26 DIAGNOSIS — R7303 Prediabetes: Secondary | ICD-10-CM

## 2019-07-26 DIAGNOSIS — F3289 Other specified depressive episodes: Secondary | ICD-10-CM | POA: Diagnosis not present

## 2019-07-26 DIAGNOSIS — Z9189 Other specified personal risk factors, not elsewhere classified: Secondary | ICD-10-CM

## 2019-07-26 DIAGNOSIS — Z6835 Body mass index (BMI) 35.0-35.9, adult: Secondary | ICD-10-CM

## 2019-07-26 MED ORDER — METFORMIN HCL 500 MG PO TABS: 1000.0000 mg | ORAL_TABLET | Freq: Every morning | ORAL | 0 refills | Status: DC

## 2019-07-26 MED ORDER — BUPROPION HCL ER (SR) 150 MG PO TB12: 150.0000 mg | ORAL_TABLET | Freq: Every day | ORAL | 0 refills | Status: DC

## 2019-07-26 NOTE — Progress Notes (Signed)
Chief Complaint:   OBESITY Tammy Brennan is here to discuss her progress with her obesity treatment plan along with follow-up of her obesity related diagnoses. Tammy Brennan is on the Category 2 Plan and states she is following her eating plan approximately 90% of the time. Tammy Brennan states she is doing 0 minutes 0 times per week.  Today's visit was #: 4 Starting weight: 206 lbs Starting date: 05/20/2019 Today's weight: 199 lbs Today's date: 07/26/2019 Total lbs lost to date: 7 Total lbs lost since last in-office visit: 0  Interim History: Tammy Brennan is on the plan at breakfast and lunch, but she struggles in the evenings. She is doing quite well on the plan. Her hunger is better controlled.  Subjective:   1. Pre-diabetes Tammy Brennan feels metformin helps with her hunger. Lab Results  Component Value Date   HGBA1C 5.9 05/03/2019    2. Other depression, with emotional eating  Unknown notes food cravings in the evenings. She is seeing Dr. Mallie Mussel and she feels this is beneficial.  3. At risk for diabetes mellitus Tammy Brennan is at higher than average risk for developing diabetes due to her obesity and prediabetic status.   Assessment/Plan:   1. Pre-diabetes Tammy Brennan will continue to work on weight loss, exercise, and decreasing simple carbohydrates to help decrease the risk of diabetes. We will refill metformin for 1 month (she is to take 2 tablets in the morning).  - metFORMIN (GLUCOPHAGE) 500 MG tablet; Take 2 tablets (1,000 mg total) by mouth every morning.  Dispense: 60 tablet; Refill: 0  2. Other depression, with emotional eating  Behavior modification techniques were discussed today to help Tammy Brennan deal with her emotional/non-hunger eating behaviors. Tammy Brennan agreed to start bupropion SR 150 mg q daily with no refills. Orders and follow up as documented in patient record.   - buPROPion (WELLBUTRIN SR) 150 MG 12 hr tablet; Take 1 tablet (150 mg total) by mouth daily.  Dispense: 30  tablet; Refill: 0  3. At risk for diabetes mellitus Tammy Brennan was given approximately 15 minutes of diabetes education and counseling today. We discussed intensive lifestyle modifications today with an emphasis on weight loss as well as increasing exercise and decreasing simple carbohydrates in her diet. We also reviewed medication options with an emphasis on risk versus benefit of those discussed.   Repetitive spaced learning was employed today to elicit superior memory formation and behavioral change.  4. Class 2 severe obesity with serious comorbidity and body mass index (BMI) of 35.0 to 35.9 in adult, unspecified obesity type [facility]) Tammy Brennan is currently in the action stage of change. As such, her goal is to continue with weight loss efforts. She has agreed to the Category 2 Plan + 100 calories.   Exercise goals: Tammy Brennan is to walk for 30 minutes 3 times per week.  Behavioral modification strategies: increasing lean protein intake and better snacking choices.  Tammy Brennan has agreed to follow-up with our clinic in 2 weeks. She was informed of the importance of frequent follow-up visits to maximize her success with intensive lifestyle modifications for her multiple health conditions.   Objective:   Blood pressure 111/72, pulse 80, temperature 97.8 F (36.6 C), temperature source Oral, height 5\' 3"  (1.6 m), weight 199 lb (90.3 kg), SpO2 97 %. Body mass index is 35.25 kg/m.  General: Cooperative, alert, well developed, in no acute distress. HEENT: Conjunctivae and lids unremarkable. Cardiovascular: Regular rhythm.  Lungs: Normal work of breathing. Neurologic: No focal deficits.   Lab Results  Component  Value Date   CREATININE 0.67 05/20/2019   BUN 12 05/20/2019   NA 139 05/20/2019   K 4.2 05/20/2019   CL 99 05/20/2019   CO2 25 05/20/2019   Lab Results  Component Value Date   ALT 42 (H) 05/20/2019   AST 41 (H) 05/20/2019   ALKPHOS 76 05/20/2019   BILITOT 0.4 05/20/2019   Lab  Results  Component Value Date   HGBA1C 5.9 05/03/2019   HGBA1C 5.8 06/21/2016   Lab Results  Component Value Date   INSULIN 10.1 05/20/2019   Lab Results  Component Value Date   TSH 1.72 05/03/2019   Lab Results  Component Value Date   CHOL 199 05/20/2019   HDL 71 05/20/2019   LDLCALC 97 05/20/2019   LDLDIRECT 96.0 05/03/2019   TRIG 185 (H) 05/20/2019   CHOLHDL 3 05/03/2019   Lab Results  Component Value Date   WBC 6.7 05/31/2019   HGB 12.4 05/31/2019   HCT 35.7 (L) 05/31/2019   MCV 90.5 05/31/2019   PLT 391.0 05/31/2019   No results found for: IRON, TIBC, FERRITIN  Attestation Statements:   Reviewed by clinician on day of visit: allergies, medications, problem list, medical history, surgical history, family history, social history, and previous encounter notes.   Trude Mcburney, am acting as Energy manager for [place], FNP-C.  I have reviewed the above documentation for accuracy and completeness, and I agree with the above. -  Jesse Sans, FNP

## 2019-07-27 ENCOUNTER — Encounter (INDEPENDENT_AMBULATORY_CARE_PROVIDER_SITE_OTHER): Payer: Self-pay | Admitting: Family Medicine

## 2019-07-27 DIAGNOSIS — R7303 Prediabetes: Secondary | ICD-10-CM | POA: Insufficient documentation

## 2019-07-27 DIAGNOSIS — F32A Depression, unspecified: Secondary | ICD-10-CM

## 2019-07-27 HISTORY — DX: Depression, unspecified: F32.A

## 2019-07-27 HISTORY — DX: Prediabetes: R73.03

## 2019-07-29 DIAGNOSIS — Z6836 Body mass index (BMI) 36.0-36.9, adult: Secondary | ICD-10-CM | POA: Diagnosis not present

## 2019-07-29 DIAGNOSIS — Z01419 Encounter for gynecological examination (general) (routine) without abnormal findings: Secondary | ICD-10-CM | POA: Diagnosis not present

## 2019-08-02 ENCOUNTER — Telehealth (INDEPENDENT_AMBULATORY_CARE_PROVIDER_SITE_OTHER): Payer: BC Managed Care – PPO | Admitting: Psychology

## 2019-08-02 ENCOUNTER — Other Ambulatory Visit: Payer: Self-pay

## 2019-08-03 LAB — HM PAP SMEAR

## 2019-08-10 ENCOUNTER — Ambulatory Visit (INDEPENDENT_AMBULATORY_CARE_PROVIDER_SITE_OTHER): Payer: BC Managed Care – PPO | Admitting: Family Medicine

## 2019-08-10 ENCOUNTER — Encounter (INDEPENDENT_AMBULATORY_CARE_PROVIDER_SITE_OTHER): Payer: Self-pay | Admitting: Family Medicine

## 2019-08-10 ENCOUNTER — Other Ambulatory Visit: Payer: Self-pay

## 2019-08-10 VITALS — BP 105/67 | HR 72 | Temp 97.9°F | Ht 63.0 in | Wt 197.0 lb

## 2019-08-10 DIAGNOSIS — Z6835 Body mass index (BMI) 35.0-35.9, adult: Secondary | ICD-10-CM | POA: Diagnosis not present

## 2019-08-10 DIAGNOSIS — I10 Essential (primary) hypertension: Secondary | ICD-10-CM

## 2019-08-10 DIAGNOSIS — F3289 Other specified depressive episodes: Secondary | ICD-10-CM

## 2019-08-10 NOTE — Progress Notes (Signed)
Chief Complaint:   OBESITY Tammy Brennan is here to discuss her progress with her obesity treatment plan along with follow-up of her obesity related diagnoses. Tammy Brennan is on the Category 2 Plan + 100 calories and states she is following her eating plan approximately 50% of the time. Tammy Brennan states she is walking for 30 minutes 2-3 times per week.  Today's visit was #: 5 Starting weight: 206 lbs Starting date: 05/20/2019 Today's weight: 197 lbs Today's date: 08/10/2019 Total lbs lost to date: 9 Total lbs lost since last in-office visit: 2  Interim History: Tammy Brennan went to the beach this past weekend and was partially off the plan. She likes the food on the plan and is eating all of the prescribed protein. Her hunger is satisfied.  Subjective:   1. Essential hypertension Tammy Brennan's blood pressure is well controlled on Hyzaar (100/12.5) and 12.5 mg hydrochlorothiazide. She is unsure why she is on extra hydrochlorothiazide. Her blood pressure is 105/67 today.   BP Readings from Last 3 Encounters:  08/10/19 105/67  07/26/19 111/72  06/28/19 105/71   Lab Results  Component Value Date   CREATININE 0.67 05/20/2019   CREATININE 0.60 05/03/2019   CREATININE 0.66 06/15/2018   2. Other depression, with emotional eating  Tammy Brennan was started on bupropion at her last office visit for cravings in the evenings. She feels she it is working well.  Assessment/Plan:   1. Essential hypertension Tammy Brennan is working on healthy weight loss and exercise to improve blood pressure control. We will watch for signs of hypotension as she continues her lifestyle modifications. Tammy Brennan agreed to continue losartan-hydrochlorothiazide, and will discontinue hydrochlorothiazide.  2. Other depression, with emotional eating  Behavior modification techniques were discussed today to help [place] deal with her emotional/non-hunger eating behaviors. Ger will continue bupropion. Orders and follow up as  documented in patient record.   3. Class 2 severe obesity with serious comorbidity and body mass index (BMI) of 35.0 to 35.9 in adult, unspecified obesity type [facility]) Tammy Brennan is currently in the action stage of change. As such, her goal is to continue with weight loss efforts. She has agreed to the Category 2 Plan + 300 calories.   Exercise goals: As is.  Behavioral modification strategies: better snacking choices and planning for success.  Tammy Brennan has agreed to follow-up with our clinic in 2 weeks. She was informed of the importance of frequent follow-up visits to maximize her success with intensive lifestyle modifications for her multiple health conditions.   Objective:   Blood pressure 105/67, pulse 72, temperature 97.9 F (36.6 C), height 5\' 3"  (1.6 m), weight 197 lb (89.4 kg), SpO2 96 %. Body mass index is 34.9 kg/m.  General: Cooperative, alert, well developed, in no acute distress. HEENT: Conjunctivae and lids unremarkable. Cardiovascular: Regular rhythm.  Lungs: Normal work of breathing. Neurologic: No focal deficits.   Lab Results  Component Value Date   CREATININE 0.67 05/20/2019   BUN 12 05/20/2019   NA 139 05/20/2019   K 4.2 05/20/2019   CL 99 05/20/2019   CO2 25 05/20/2019   Lab Results  Component Value Date   ALT 42 (H) 05/20/2019   AST 41 (H) 05/20/2019   ALKPHOS 76 05/20/2019   BILITOT 0.4 05/20/2019   Lab Results  Component Value Date   HGBA1C 5.9 05/03/2019   HGBA1C 5.8 06/21/2016   Lab Results  Component Value Date   INSULIN 10.1 05/20/2019   Lab Results  Component Value Date   TSH 1.72  05/03/2019   Lab Results  Component Value Date   CHOL 199 05/20/2019   HDL 71 05/20/2019   LDLCALC 97 05/20/2019   LDLDIRECT 96.0 05/03/2019   TRIG 185 (H) 05/20/2019   CHOLHDL 3 05/03/2019   Lab Results  Component Value Date   WBC 6.7 05/31/2019   HGB 12.4 05/31/2019   HCT 35.7 (L) 05/31/2019   MCV 90.5 05/31/2019   PLT 391.0 05/31/2019   No  results found for: IRON, TIBC, FERRITIN  Attestation Statements:   Reviewed by clinician on day of visit: allergies, medications, problem list, medical history, surgical history, family history, social history, and previous encounter notes.   Trude Mcburney, am acting as Energy manager for [place], FNP-C.  I have reviewed the above documentation for accuracy and completeness, and I agree with the above. -  Jesse Sans, FNP

## 2019-08-24 ENCOUNTER — Other Ambulatory Visit: Payer: Self-pay

## 2019-08-24 ENCOUNTER — Ambulatory Visit (INDEPENDENT_AMBULATORY_CARE_PROVIDER_SITE_OTHER): Payer: BC Managed Care – PPO | Admitting: Family Medicine

## 2019-08-24 ENCOUNTER — Encounter (INDEPENDENT_AMBULATORY_CARE_PROVIDER_SITE_OTHER): Payer: Self-pay | Admitting: Family Medicine

## 2019-08-24 VITALS — BP 115/75 | HR 74 | Temp 97.6°F | Ht 63.0 in | Wt 199.0 lb

## 2019-08-24 DIAGNOSIS — I10 Essential (primary) hypertension: Secondary | ICD-10-CM

## 2019-08-24 DIAGNOSIS — R7303 Prediabetes: Secondary | ICD-10-CM

## 2019-08-24 DIAGNOSIS — Z9189 Other specified personal risk factors, not elsewhere classified: Secondary | ICD-10-CM | POA: Diagnosis not present

## 2019-08-24 DIAGNOSIS — Z6835 Body mass index (BMI) 35.0-35.9, adult: Secondary | ICD-10-CM

## 2019-08-24 MED ORDER — METFORMIN HCL 500 MG PO TABS: 1000.0000 mg | ORAL_TABLET | Freq: Every morning | ORAL | 0 refills | Status: DC

## 2019-08-25 ENCOUNTER — Encounter (INDEPENDENT_AMBULATORY_CARE_PROVIDER_SITE_OTHER): Payer: Self-pay | Admitting: Family Medicine

## 2019-08-25 NOTE — Progress Notes (Signed)
Chief Complaint:   OBESITY Tammy Brennan is here to discuss her progress with her obesity treatment plan along with follow-up of her obesity related diagnoses. Tammy Brennan is on the Category 2 Plan + 300 calories and states she is following her eating plan approximately 90% of the time. Tammy Brennan states she is walking for 30 minutes 2-3 times per week.  Today's visit was #: 6 Starting weight: 206 lbs Starting date: 05/20/2019 Today's weight: 199 lbs Today's date: 08/25/2019 Total lbs lost to date: 7 Total lbs lost since last in-office visit: 0  Interim History: [place] has adhered to the plan very well. She is eating all of the protein on the plan mostly but occasionally skips dinner.  Subjective:   1. Pre-diabetes Tammy Brennan has a diagnosis of pre-diabetes based on her elevated Hgb A1c and was informed this puts her at greater risk of developing diabetes. She denies polyphagia, and she feels her appetite has decreased overall. She continues to work on diet and exercise to decrease her risk of diabetes. She denies nausea or hypoglycemia.  Lab Results  Component Value Date   HGBA1C 5.9 05/03/2019   Lab Results  Component Value Date   INSULIN 10.1 05/20/2019   2. Essential hypertension Carra's blood pressure is well controlled on losartan-hydrochlorothiazide. Cardiovascular ROS: no chest pain or dyspnea on exertion.  BP Readings from Last 3 Encounters:  08/24/19 115/75  08/10/19 105/67  07/26/19 111/72   Lab Results  Component Value Date   CREATININE 0.67 05/20/2019   CREATININE 0.60 05/03/2019   CREATININE 0.66 06/15/2018   3. At risk for hypoglycemia Tammy Brennan is at increased risk for hypoglycemia due to pre-diabetes.   Assessment/Plan:   1. Pre-diabetes Tammy Brennan will continue to work on weight loss, exercise, and decreasing simple carbohydrates to help decrease the risk of diabetes. We will refill metformin for 1 month.  - metFORMIN (GLUCOPHAGE) 500 MG tablet; Take 2  tablets (1,000 mg total) by mouth every morning.  Dispense: 60 tablet; Refill: 0  2. Essential hypertension Tammy Brennan will continue her medications, and will continue working on healthy weight loss and exercise to improve blood pressure control. We will watch for signs of hypotension as she continues her lifestyle modifications.   3. At risk for hypoglycemia Tammy Brennan was given approximately 15 minutes of counseling today regarding prevention of hypoglycemia. She was advised of symptoms of hypoglycemia. Tammy Brennan was instructed to avoid skipping meals, eat regular protein rich meals and schedule low calorie snacks as needed.   Repetitive spaced learning was employed today to elicit superior memory formation and behavioral change  4. Class 2 severe obesity with serious comorbidity and body mass index (BMI) of 35.0 to 35.9 in adult, unspecified obesity type [name]) Tammy Brennan is currently in the action stage of change. As such, her goal is to continue with weight loss efforts. She has agreed to the Category 2 Plan.   Exercise goals: Tammy Brennan is to increase walking to 30 minutes 5 times per week.  Behavioral modification strategies: increasing lean protein intake and holiday eating strategies .  Tammy Brennan has agreed to follow-up with our clinic in 2 weeks. She was informed of the importance of frequent follow-up visits to maximize her success with intensive lifestyle modifications for her multiple health conditions.   Objective:   Blood pressure 115/75, pulse 74, temperature 97.6 F (36.4 C), temperature source Oral, height 5\' 3"  (1.6 m), weight 199 lb (90.3 kg), SpO2 98 %. Body mass index is 35.25 kg/m.  General: Cooperative, alert,  well developed, in no acute distress. HEENT: Conjunctivae and lids unremarkable. Cardiovascular: Regular rhythm.  Lungs: Normal work of breathing. Neurologic: No focal deficits.   Lab Results  Component Value Date   CREATININE 0.67 05/20/2019   BUN 12 05/20/2019     NA 139 05/20/2019   K 4.2 05/20/2019   CL 99 05/20/2019   CO2 25 05/20/2019   Lab Results  Component Value Date   ALT 42 (H) 05/20/2019   AST 41 (H) 05/20/2019   ALKPHOS 76 05/20/2019   BILITOT 0.4 05/20/2019   Lab Results  Component Value Date   HGBA1C 5.9 05/03/2019   HGBA1C 5.8 06/21/2016   Lab Results  Component Value Date   INSULIN 10.1 05/20/2019   Lab Results  Component Value Date   TSH 1.72 05/03/2019   Lab Results  Component Value Date   CHOL 199 05/20/2019   HDL 71 05/20/2019   LDLCALC 97 05/20/2019   LDLDIRECT 96.0 05/03/2019   TRIG 185 (H) 05/20/2019   CHOLHDL 3 05/03/2019   Lab Results  Component Value Date   WBC 6.7 05/31/2019   HGB 12.4 05/31/2019   HCT 35.7 (L) 05/31/2019   MCV 90.5 05/31/2019   PLT 391.0 05/31/2019   No results found for: IRON, TIBC, FERRITIN  Attestation Statements:   Reviewed by clinician on day of visit: allergies, medications, problem list, medical history, surgical history, family history, social history, and previous encounter notes.   Tammy Brennan, am acting as Energy manager for [place], FNP-C.  I have reviewed the above documentation for accuracy and completeness, and I agree with the above. -  Tammy Sans, FNP

## 2019-09-16 ENCOUNTER — Ambulatory Visit (INDEPENDENT_AMBULATORY_CARE_PROVIDER_SITE_OTHER): Payer: BC Managed Care – PPO | Admitting: Family Medicine

## 2019-09-16 ENCOUNTER — Encounter (INDEPENDENT_AMBULATORY_CARE_PROVIDER_SITE_OTHER): Payer: Self-pay | Admitting: Family Medicine

## 2019-09-16 ENCOUNTER — Other Ambulatory Visit: Payer: Self-pay

## 2019-09-16 VITALS — BP 103/71 | HR 65 | Temp 97.9°F | Ht 63.0 in | Wt 196.0 lb

## 2019-09-16 DIAGNOSIS — E781 Pure hyperglyceridemia: Secondary | ICD-10-CM | POA: Diagnosis not present

## 2019-09-16 DIAGNOSIS — F3289 Other specified depressive episodes: Secondary | ICD-10-CM

## 2019-09-16 DIAGNOSIS — R7303 Prediabetes: Secondary | ICD-10-CM

## 2019-09-16 DIAGNOSIS — Z6834 Body mass index (BMI) 34.0-34.9, adult: Secondary | ICD-10-CM

## 2019-09-16 DIAGNOSIS — R748 Abnormal levels of other serum enzymes: Secondary | ICD-10-CM | POA: Diagnosis not present

## 2019-09-16 DIAGNOSIS — Z9189 Other specified personal risk factors, not elsewhere classified: Secondary | ICD-10-CM

## 2019-09-16 DIAGNOSIS — E669 Obesity, unspecified: Secondary | ICD-10-CM

## 2019-09-17 LAB — LIPID PANEL WITH LDL/HDL RATIO
Cholesterol, Total: 176 mg/dL (ref 100–199)
HDL: 68 mg/dL (ref >39)
LDL Chol Calc (NIH): 71 mg/dL (ref 0–99)
LDL/HDL Ratio: 1 ratio (ref 0.0–3.2)
Triglycerides: 227 mg/dL — ABNORMAL HIGH (ref 0–149)
VLDL Cholesterol Cal: 37 mg/dL (ref 5–40)

## 2019-09-17 LAB — CBC WITH DIFFERENTIAL/PLATELET
Basophils Absolute: 0 10*3/uL (ref 0.0–0.2)
Basos: 0 %
EOS (ABSOLUTE): 0.8 10*3/uL — ABNORMAL HIGH (ref 0.0–0.4)
Eos: 9 %
Hematocrit: 40.5 % (ref 34.0–46.6)
Hemoglobin: 13.2 g/dL (ref 11.1–15.9)
Immature Grans (Abs): 0 10*3/uL (ref 0.0–0.1)
Immature Granulocytes: 0 %
Lymphocytes Absolute: 2.5 10*3/uL (ref 0.7–3.1)
Lymphs: 27 %
MCH: 30.1 pg (ref 26.6–33.0)
MCHC: 32.6 g/dL (ref 31.5–35.7)
MCV: 93 fL (ref 79–97)
Monocytes Absolute: 0.5 10*3/uL (ref 0.1–0.9)
Monocytes: 6 %
Neutrophils Absolute: 5.2 10*3/uL (ref 1.4–7.0)
Neutrophils: 58 %
Platelets: 414 10*3/uL (ref 150–450)
RBC: 4.38 x10E6/uL (ref 3.77–5.28)
RDW: 12.1 % (ref 11.7–15.4)
WBC: 9.2 10*3/uL (ref 3.4–10.8)

## 2019-09-17 LAB — HEMOGLOBIN A1C
Est. average glucose Bld gHb Est-mCnc: 117 mg/dL
Hgb A1c MFr Bld: 5.7 % — ABNORMAL HIGH (ref 4.8–5.6)

## 2019-09-17 LAB — INSULIN, RANDOM: INSULIN: 10.2 u[IU]/mL (ref 2.6–24.9)

## 2019-09-21 ENCOUNTER — Encounter (INDEPENDENT_AMBULATORY_CARE_PROVIDER_SITE_OTHER): Payer: Self-pay | Admitting: Family Medicine

## 2019-09-21 NOTE — Progress Notes (Signed)
Chief Complaint:   OBESITY Tammy Brennan is here to discuss her progress with her obesity treatment plan along with follow-up of her obesity related diagnoses. Tammy Brennan is on the Category 2 Plan and states she is following her eating plan approximately 90% of the time. Tammy Brennan states she is walking for 30 minutes 5 times per week.  Today's visit was #: 7 Starting weight: 206 lbs Starting date: 05/20/2019 Today's weight: 196 lbs Today's date: 09/16/2019 Total lbs lost to date: 10 Total lbs lost since last in-office visit: 3  Interim History: Tammy Brennan has increased her walking as directed at her last office visit. She is sticking to the plan very well.  Subjective:   1. Pre-diabetes Tammy Brennan has a diagnosis of pre-diabetes based on her elevated Hgb A1c and was informed this puts her at greater risk of developing diabetes. Last A1c was 5.9. She notes diarrhea with metformin. She is on 1,000 mg in the morning. She continues to work on diet and exercise to decrease her risk of diabetes. She denies nausea or hypoglycemia.  Lab Results  Component Value Date   HGBA1C 5.7 (H) 09/16/2019   Lab Results  Component Value Date   INSULIN 10.2 09/16/2019   INSULIN 10.1 05/20/2019   2. Hypertriglyceridemia Tammy Brennan's last triglycerides were elevated at 185. Her LDL and HDL were with in normal limits. She is not on statin. I discussed labs with the patient today.  Lab Results  Component Value Date   ALT 42 (H) 05/20/2019   AST 41 (H) 05/20/2019   ALKPHOS 76 05/20/2019   BILITOT 0.4 05/20/2019   Lab Results  Component Value Date   CHOL 176 09/16/2019   HDL 68 09/16/2019   LDLCALC 71 09/16/2019   LDLDIRECT 96.0 05/03/2019   TRIG 227 (H) 09/16/2019   CHOLHDL 3 05/03/2019   3. Other depression, with emotional eating  Tammy Brennan notes her cravings are well controlled. She would like to discontinue bupropion.  4. At risk for heart disease Tammy Brennan is at a higher than average risk for  cardiovascular disease due to obesity, and family history of CAD and hypertension.   Assessment/Plan:   1. Pre-diabetes Tammy Brennan will continue to work on weight loss, exercise, and decreasing simple carbohydrates to help decrease the risk of diabetes. We will check labs today. Tammy Brennan agreed to decreased metformin to 500 mg q AM.  - Hemoglobin A1c - Insulin, random   2. Hypertriglyceridemia Cardiovascular risk and specific lipid/LDL goals reviewed. We discussed several lifestyle modifications today and Tammy Brennan will continue to work on diet, exercise and weight loss efforts. We will check labs today.  medical record.  - Lipid Panel With LDL/HDL Ratio  3. Other depression, with emotional eating  Behavior modification techniques were discussed today to help Tammy Brennan deal with her emotional/non-hunger eating behaviors. Tammy Brennan agreed to discontinue bupropion. Orders and follow up as documented in patient record.   4. At risk for heart disease Tammy Brennan was given approximately 15 minutes of coronary artery disease prevention counseling today. She is 44 y.o. female and has risk factors for heart disease including obesity. We discussed intensive lifestyle modifications today with an emphasis on specific weight loss instructions and strategies.   Repetitive spaced learning was employed today to elicit superior memory formation and behavioral change.  5. Class 1 obesity with serious comorbidity and body mass index (BMI) of 34.0 to 34.9 in adult, unspecified obesity type Tammy Brennan is currently in the action stage of change. As such, her goal is to  continue with weight loss efforts. She has agreed to the Category 2 Plan.   Exercise goals: As is.  Behavioral modification strategies: planning for success.  Tammy Brennan has agreed to follow-up with our clinic in 3 weeks. She was informed of the importance of frequent follow-up visits to maximize her success with intensive lifestyle modifications for  her multiple health conditions.   Tammy Brennan was informed we would discuss her lab results at her next visit unless there is a critical issue that needs to be addressed sooner. Tammy Brennan agreed to keep her next visit at the agreed upon time to discuss these results.  Objective:   Blood pressure 103/71, pulse 65, temperature 97.9 F (36.6 C), temperature source Oral, height 5\' 3"  (1.6 m), weight 196 lb (88.9 kg), SpO2 99 %. Body mass index is 34.72 kg/m.  General: Cooperative, alert, well developed, in no acute distress. HEENT: Conjunctivae and lids unremarkable. Cardiovascular: Regular rhythm.  Lungs: Normal work of breathing. Neurologic: No focal deficits.   Lab Results  Component Value Date   CREATININE 0.67 05/20/2019   BUN 12 05/20/2019   NA 139 05/20/2019   K 4.2 05/20/2019   CL 99 05/20/2019   CO2 25 05/20/2019   Lab Results  Component Value Date   ALT 42 (H) 05/20/2019   AST 41 (H) 05/20/2019   ALKPHOS 76 05/20/2019   BILITOT 0.4 05/20/2019   Lab Results  Component Value Date   HGBA1C 5.7 (H) 09/16/2019   HGBA1C 5.9 05/03/2019   HGBA1C 5.8 06/21/2016   Lab Results  Component Value Date   INSULIN 10.2 09/16/2019   INSULIN 10.1 05/20/2019   Lab Results  Component Value Date   TSH 1.72 05/03/2019   Lab Results  Component Value Date   CHOL 176 09/16/2019   HDL 68 09/16/2019   LDLCALC 71 09/16/2019   LDLDIRECT 96.0 05/03/2019   TRIG 227 (H) 09/16/2019   CHOLHDL 3 05/03/2019   Lab Results  Component Value Date   WBC 9.2 09/16/2019   HGB 13.2 09/16/2019   HCT 40.5 09/16/2019   MCV 93 09/16/2019   PLT 414 09/16/2019   No results found for: IRON, TIBC, FERRITIN   Attestation Statements:   Reviewed by clinician on day of visit: allergies, medications, problem list, medical history, surgical history, family history, social history, and previous encounter notes.   11/17/2019, am acting as Trude Mcburney for Energy manager, FNP-C.  I have  reviewed the above documentation for accuracy and completeness, and I agree with the above. -  [place], FNP

## 2019-09-27 DIAGNOSIS — Z1231 Encounter for screening mammogram for malignant neoplasm of breast: Secondary | ICD-10-CM | POA: Diagnosis not present

## 2019-09-27 LAB — HM MAMMOGRAPHY

## 2019-09-28 ENCOUNTER — Encounter: Payer: Self-pay | Admitting: *Deleted

## 2019-10-05 ENCOUNTER — Ambulatory Visit (INDEPENDENT_AMBULATORY_CARE_PROVIDER_SITE_OTHER): Payer: BC Managed Care – PPO | Admitting: Family Medicine

## 2019-10-05 ENCOUNTER — Other Ambulatory Visit: Payer: Self-pay

## 2019-10-05 ENCOUNTER — Encounter (INDEPENDENT_AMBULATORY_CARE_PROVIDER_SITE_OTHER): Payer: Self-pay | Admitting: Family Medicine

## 2019-10-05 VITALS — BP 107/65 | HR 77 | Temp 98.2°F | Ht 63.0 in | Wt 196.0 lb

## 2019-10-05 DIAGNOSIS — R7303 Prediabetes: Secondary | ICD-10-CM | POA: Diagnosis not present

## 2019-10-05 DIAGNOSIS — Z9189 Other specified personal risk factors, not elsewhere classified: Secondary | ICD-10-CM | POA: Diagnosis not present

## 2019-10-05 DIAGNOSIS — I10 Essential (primary) hypertension: Secondary | ICD-10-CM

## 2019-10-05 DIAGNOSIS — E669 Obesity, unspecified: Secondary | ICD-10-CM

## 2019-10-05 DIAGNOSIS — Z6834 Body mass index (BMI) 34.0-34.9, adult: Secondary | ICD-10-CM

## 2019-10-05 MED ORDER — METFORMIN HCL 500 MG PO TABS: 500.0000 mg | ORAL_TABLET | Freq: Every morning | ORAL | 0 refills | Status: DC

## 2019-10-05 NOTE — Progress Notes (Signed)
The 10-year ASCVD risk score Denman George DC Montez Hageman., et al., 2013) is: 0.4%   Values used to calculate the score:     Age: 44 years     Sex: Female     Is Non-Hispanic African American: No     Diabetic: No     Tobacco smoker: No     Systolic Blood Pressure: 107 mmHg     Is BP treated: Yes     HDL Cholesterol: 68 mg/dL     Total Cholesterol: 176 mg/dL

## 2019-10-06 ENCOUNTER — Encounter (INDEPENDENT_AMBULATORY_CARE_PROVIDER_SITE_OTHER): Payer: Self-pay | Admitting: Family Medicine

## 2019-10-06 NOTE — Progress Notes (Signed)
Chief Complaint:   OBESITY Tammy Brennan is here to discuss her progress with her obesity treatment plan along with follow-up of her obesity related diagnoses. Tammy Brennan is on the Category 2 Plan and states she is following her eating plan approximately 90% of the time. Tammy Brennan states she is walking for 30 minutes 5 times per week.  Today's visit was #: 8 Starting weight: 206 lbs Starting date: 05/20/2019 Today's weight: 196 lbs Today's date: 10/05/2019 Total lbs lost to date: 10 Total lbs lost since last in-office visit: 0  Interim History: Tammy Brennan just got back from a week at the beach but has maintained her weight. However, she is going to [place] this weekend.  Subjective:   1. Pre-diabetes Tammy Brennan has a diagnosis of pre-diabetes based on her elevated Hgb A1c and was informed this puts her at greater risk of developing diabetes. We decreased her dose of metformin to 250 mg  daily due to diarrhea. Her diarrhea has decreased. She denies polyphagia. Her A1c has improved. She continues to work on diet and exercise to decrease her risk of diabetes. I discussed labs with the patient today.  Lab Results  Component Value Date   HGBA1C 5.7 (H) 09/16/2019   Lab Results  Component Value Date   INSULIN 10.2 09/16/2019   INSULIN 10.1 05/20/2019   2. Essential hypertension Tammy Brennan's blood pressure is well controlled but it is on the low side. Cardiovascular ROS: no chest pain or dyspnea on exertion.  BP Readings from Last 3 Encounters:  10/05/19 107/65  09/16/19 103/71  08/24/19 115/75   Lab Results  Component Value Date   CREATININE 0.67 05/20/2019   CREATININE 0.60 05/03/2019   CREATININE 0.66 06/15/2018   3. At risk for diabetes mellitus Tammy Brennan is at higher than average risk for developing diabetes due to her obesity.   Assessment/Plan:   1. Pre-diabetes Tammy Brennan will continue to work on weight loss, exercise, and decreasing simple carbohydrates to help decrease the  risk of diabetes. We will refill metformin for 1 month. Tammy Brennan may take 250 or 500 mg metformin daily.  - metFORMIN (GLUCOPHAGE) 500 MG tablet; Take 1 tablet (500 mg total) by mouth every morning.  Dispense: 30 tablet; Refill: 0  2. Essential hypertension Tammy Brennan will continue her medications, and will continue working on healthy weight loss and exercise to improve blood pressure control. We will watch for signs of hypotension as she continues her lifestyle modifications. We may need to consider decreasing HCTZ.  3. At risk for diabetes mellitus Tammy Brennan was given approximately 15 minutes of diabetes education and counseling today. We discussed intensive lifestyle modifications today with an emphasis on weight loss as well as increasing exercise and decreasing simple carbohydrates in her diet. We also reviewed medication options with an emphasis on risk versus benefit of those discussed.   Repetitive spaced learning was employed today to elicit superior memory formation and behavioral change.  4. Class 1 obesity with serious comorbidity and body mass index (BMI) of 34.0 to 34.9 in adult, unspecified obesity type Tammy Brennan is currently in the action stage of change. As such, her goal is to continue with weight loss efforts. She has agreed to the Category 2 Plan.   Exercise goals: As is.  Behavioral modification strategies: planning for success.  Tammy Brennan has agreed to follow-up with our clinic in 2 weeks. She was informed of the importance of frequent follow-up visits to maximize her success with intensive lifestyle modifications for her multiple health conditions.  Objective:   Blood pressure 107/65, pulse 77, temperature 98.2 F (36.8 C), temperature source Oral, height 5\' 3"  (1.6 m), weight 196 lb (88.9 kg), SpO2 97 %. Body mass index is 34.72 kg/m.  General: Cooperative, alert, well developed, in no acute distress. HEENT: Conjunctivae and lids unremarkable. Cardiovascular: Regular  rhythm.  Lungs: Normal work of breathing. Neurologic: No focal deficits.   Lab Results  Component Value Date   CREATININE 0.67 05/20/2019   BUN 12 05/20/2019   NA 139 05/20/2019   K 4.2 05/20/2019   CL 99 05/20/2019   CO2 25 05/20/2019   Lab Results  Component Value Date   ALT 42 (H) 05/20/2019   AST 41 (H) 05/20/2019   ALKPHOS 76 05/20/2019   BILITOT 0.4 05/20/2019   Lab Results  Component Value Date   HGBA1C 5.7 (H) 09/16/2019   HGBA1C 5.9 05/03/2019   HGBA1C 5.8 06/21/2016   Lab Results  Component Value Date   INSULIN 10.2 09/16/2019   INSULIN 10.1 05/20/2019   Lab Results  Component Value Date   TSH 1.72 05/03/2019   Lab Results  Component Value Date   CHOL 176 09/16/2019   HDL 68 09/16/2019   LDLCALC 71 09/16/2019   LDLDIRECT 96.0 05/03/2019   TRIG 227 (H) 09/16/2019   CHOLHDL 3 05/03/2019   Lab Results  Component Value Date   WBC 9.2 09/16/2019   HGB 13.2 09/16/2019   HCT 40.5 09/16/2019   MCV 93 09/16/2019   PLT 414 09/16/2019   No results found for: IRON, TIBC, FERRITIN  Attestation Statements:   Reviewed by clinician on day of visit: allergies, medications, problem list, medical history, surgical history, family history, social history, and previous encounter notes.   11/17/2019, am acting as Tammy Brennan for Energy manager, FNP-C.  I have reviewed the above documentation for accuracy and completeness, and I agree with the above. -  [place], FNP

## 2019-10-18 ENCOUNTER — Ambulatory Visit (INDEPENDENT_AMBULATORY_CARE_PROVIDER_SITE_OTHER): Payer: BC Managed Care – PPO | Admitting: Family Medicine

## 2019-10-25 ENCOUNTER — Ambulatory Visit: Payer: BC Managed Care – PPO | Admitting: Family Medicine

## 2019-11-10 ENCOUNTER — Ambulatory Visit (INDEPENDENT_AMBULATORY_CARE_PROVIDER_SITE_OTHER): Payer: BC Managed Care – PPO | Admitting: Family Medicine

## 2019-12-29 DIAGNOSIS — R059 Cough, unspecified: Secondary | ICD-10-CM | POA: Diagnosis not present

## 2019-12-29 DIAGNOSIS — J329 Chronic sinusitis, unspecified: Secondary | ICD-10-CM | POA: Diagnosis not present

## 2020-01-03 ENCOUNTER — Other Ambulatory Visit: Payer: Self-pay

## 2020-01-03 ENCOUNTER — Encounter: Payer: Self-pay | Admitting: Family Medicine

## 2020-01-03 ENCOUNTER — Telehealth (INDEPENDENT_AMBULATORY_CARE_PROVIDER_SITE_OTHER): Payer: BC Managed Care – PPO | Admitting: Family Medicine

## 2020-01-03 VITALS — Temp 98.2°F

## 2020-01-03 DIAGNOSIS — J208 Acute bronchitis due to other specified organisms: Secondary | ICD-10-CM

## 2020-01-03 DIAGNOSIS — B9689 Other specified bacterial agents as the cause of diseases classified elsewhere: Secondary | ICD-10-CM

## 2020-01-03 MED ORDER — AZITHROMYCIN 250 MG PO TABS: ORAL_TABLET | ORAL | 0 refills | Status: DC

## 2020-01-03 MED ORDER — PROMETHAZINE-DM 6.25-15 MG/5ML PO SYRP: 5.0000 mL | ORAL_SOLUTION | Freq: Four times a day (QID) | ORAL | 0 refills | Status: DC | PRN

## 2020-01-03 NOTE — Progress Notes (Signed)
Chief Complaint  Patient presents with  . Cough    Tammy Brennan here for URI complaints. Due to COVID-19 pandemic, we are interacting via web portal for an electronic face-to-face visit. I verified patient's ID using 2 identifiers. Patient agreed to proceed with visit via this method. Patient is at home, I am at office. Patient and I are present for visit.   Duration: 1 week  Associated symptoms: dry cough that makes her throw up she is coughing so hard Denies: sinus congestion, sinus pain, rhinorrhea, itchy watery eyes, ear pain, ear drainage, sore throat, wheezing, shortness of breath, myalgia and fevers Treatment to date: tessalon Perles, amoxicillin Sick contacts: Yes- son started with it, tx'd w Zpak and feels better  Past Medical History:  Diagnosis Date  . Carpal tunnel syndrome 06/20/2014  . Depression 07/27/2019  . Dysrhythmia    PAPLITATIONS  . Essential hypertension 06/10/2017  . Headache    HX  MIGRAINES  . Hyperlipidemia, mixed 10/10/2014  . Obesity 10/10/2014  . PONV (postoperative nausea and vomiting)   . Preventative health care 09/27/2014   Sees Dr Arrie Aran for GYN  . Wart 10/10/2014    Temp 98.2 F (36.8 C) (Oral)  No conversational dyspnea Age appropriate judgment and insight Nml affect and mood  Acute bacterial bronchitis - Plan: azithromycin (ZITHROMAX) 250 MG tablet, promethazine-dextromethorphan (PROMETHAZINE-DM) 6.25-15 MG/5ML syrup  Giving vomiting and sick contact will tx w Zpak. We did discuss the possibility of this being related to a post infectious cough, in which this could last 6-8 weeks and will not get better with the Zpak.  Continue to push fluids, practice good hand hygiene, cover mouth when coughing. F/u prn. If starting to experience fevers, shaking, or shortness of breath, seek immediate care. Pt voiced understanding and agreement to the plan.  Jilda Roche [place], DO 01/03/20 4:04 PM

## 2020-01-06 DIAGNOSIS — Z23 Encounter for immunization: Secondary | ICD-10-CM | POA: Diagnosis not present

## 2020-02-07 DIAGNOSIS — L218 Other seborrheic dermatitis: Secondary | ICD-10-CM | POA: Diagnosis not present

## 2020-02-22 ENCOUNTER — Encounter: Payer: Self-pay | Admitting: Family Medicine

## 2020-02-23 ENCOUNTER — Other Ambulatory Visit: Payer: Self-pay | Admitting: Family Medicine

## 2020-02-23 MED ORDER — SILVER SULFADIAZINE 1 % EX CREA
1.0000 | TOPICAL_CREAM | Freq: Every day | CUTANEOUS | 0 refills | Status: DC
Start: 2020-02-23 — End: 2020-06-05

## 2020-04-27 ENCOUNTER — Other Ambulatory Visit: Payer: Self-pay | Admitting: Family Medicine

## 2020-05-08 ENCOUNTER — Ambulatory Visit (INDEPENDENT_AMBULATORY_CARE_PROVIDER_SITE_OTHER): Payer: BC Managed Care – PPO | Admitting: Family Medicine

## 2020-05-08 ENCOUNTER — Other Ambulatory Visit: Payer: Self-pay

## 2020-05-08 ENCOUNTER — Encounter: Payer: Self-pay | Admitting: Family Medicine

## 2020-05-08 VITALS — BP 120/82 | HR 85 | Temp 97.6°F | Ht 62.0 in | Wt 215.0 lb

## 2020-05-08 DIAGNOSIS — G5603 Carpal tunnel syndrome, bilateral upper limbs: Secondary | ICD-10-CM

## 2020-05-08 DIAGNOSIS — E669 Obesity, unspecified: Secondary | ICD-10-CM | POA: Diagnosis not present

## 2020-05-08 DIAGNOSIS — Z0000 Encounter for general adult medical examination without abnormal findings: Secondary | ICD-10-CM

## 2020-05-08 MED ORDER — METHYLPREDNISOLONE ACETATE 40 MG/ML IJ SUSP
20.0000 mg | Freq: Once | INTRAMUSCULAR | Status: AC
  Administered 2020-05-08: 20 mg via INTRA_ARTICULAR

## 2020-05-08 MED ORDER — PHENTERMINE HCL 37.5 MG PO TABS: 37.5000 mg | ORAL_TABLET | Freq: Every day | ORAL | 0 refills | Status: DC

## 2020-05-08 NOTE — Progress Notes (Signed)
Chief Complaint  Patient presents with  . Carpal Tunnel    Both wrist    Subjective: Patient is a 45 y.o. female here for b/l carpal tunnel syndrome.  Patient has a nearly 14-year history of bilateral carpal tunnel syndrome, worse on the right side. She has worn wrist splints at night, the time which it bothers her the most, with early benefit but it is no longer helping. She has never had injections and has not interested in surgery unless she absolutely needs it. She had EMG testing that confirmed her diagnosis.  The patient has struggled with her weight. She was on phentermine in the past and is interested in going back on it.  Past Medical History:  Diagnosis Date  . Carpal tunnel syndrome 06/20/2014  . Depression 07/27/2019  . Dysrhythmia    PAPLITATIONS  . Essential hypertension 06/10/2017  . Headache    HX  MIGRAINES  . Hyperlipidemia, mixed 10/10/2014  . Obesity 10/10/2014  . PONV (postoperative nausea and vomiting)   . Preventative health care 09/27/2014   Sees Dr Arrie Aran for GYN  . Wart 10/10/2014    Objective: BP 120/82 (BP Location: Left Arm, Patient Position: Sitting, Cuff Size: Normal)   Pulse 85   Temp 97.6 F (36.4 C) (Oral)   Ht 5\' 2"  (1.575 m)   Wt 215 lb (97.5 kg)   SpO2 97%   BMI 39.32 kg/m  General: Awake, appears stated age Heart: Brisk capillary refill Lungs: No accessory muscle use Psych: Age appropriate judgment and insight, normal affect and mood  Procedure note: Carpal tunnel injection Verbal consent obtained. The palmaris longus tendon was identified and demarcated. The area was cleaned with alcohol and freeze spray was used for topical anesthesia. A 30-gauge needle was used to introduce 20 mg of Depo-Medrol and 1 mL of 1% lidocaine without epinephrine in a near parallel fashion to the nerve. A Band-Aid was placed. This process was repeated on the contralateral side. The patient tolerated the procedure well. There were no immediate  complications noted.   Assessment and Plan: Bilateral carpal tunnel syndrome - Plan: methylPREDNISolone acetate (DEPO-MEDROL) injection 20 mg, methylPREDNISolone acetate (DEPO-MEDROL) injection 20 mg, PR INJECT CARPAL TUNNEL  Obesity (BMI 30-39.9) - Plan: phentermine (ADIPEX-P) 37.5 MG tablet  Well adult exam - Plan: Comprehensive metabolic panel, CBC, Lipid panel, Hemoglobin A1c  1. Continue wearing splints. Injections today. Refer to hand if no improvement. 2. Counseled on diet and exercise. Healthy diet handout provided in the paperwork. Restart phentermine. Follow-up in 1 month for this. She requested labs as she had her physical rescheduled. We will order them for future so she can be fasting. She will follow-up with her regular PCP for a physical soon as able. The patient voiced understanding and agreement to the plan.  [place], DO 05/08/20  2:32 PM

## 2020-05-08 NOTE — Patient Instructions (Signed)
Keep the diet clean and stay active.  Let me know if there are cost issues.  Let us know if you need anything.  Healthy Eating Plan Many factors influence your heart health, including eating and exercise habits. Heart (coronary) risk increases with abnormal blood fat (lipid) levels. Heart-healthy meal planning includes limiting unhealthy fats, increasing healthy fats, and making other small dietary changes. This includes maintaining a healthy body weight to help keep lipid levels within a normal range.  WHAT IS MY PLAN?  Your health care provider recommends that you:  Drink a glass of water before meals to help with satiety.  Eat slowly.  An alternative to the water is to add Metamucil. This will help with satiety as well. It does contain calories, unlike water.  WHAT TYPES OF FAT SHOULD I CHOOSE?  Choose healthy fats more often. Choose monounsaturated and polyunsaturated fats, such as olive oil and canola oil, flaxseeds, walnuts, almonds, and seeds.  Eat more omega-3 fats. Good choices include salmon, mackerel, sardines, tuna, flaxseed oil, and ground flaxseeds. Aim to eat fish at least two times each week.  Avoid foods with partially hydrogenated oils in them. These contain trans fats. Examples of foods that contain trans fats are stick margarine, some tub margarines, cookies, crackers, and other baked goods. If you are going to avoid a fat, this is the one to avoid!  WHAT GENERAL GUIDELINES DO I NEED TO FOLLOW?  Check food labels carefully to identify foods with trans fats. Avoid these types of options when possible.  Fill one half of your plate with vegetables and green salads. Eat 4-5 servings of vegetables per day. A serving of vegetables equals 1 cup of raw leafy vegetables,  cup of raw or cooked cut-up vegetables, or  cup of vegetable juice.  Fill one fourth of your plate with whole grains. Look for the word "whole" as the first word in the ingredient list.  Fill one  fourth of your plate with lean protein foods.  Eat 4-5 servings of fruit per day. A serving of fruit equals one medium whole fruit,  cup of dried fruit,  cup of fresh, frozen, or canned fruit. Try to avoid fruits in cups/syrups as the sugar content can be high.  Eat more foods that contain soluble fiber. Examples of foods that contain this type of fiber are apples, broccoli, carrots, beans, peas, and barley. Aim to get 20-30 g of fiber per day.  Eat more home-cooked food and less restaurant, buffet, and fast food.  Limit or avoid alcohol.  Limit foods that are high in starch and sugar.  Avoid fried foods when able.  Cook foods by using methods other than frying. Baking, boiling, grilling, and broiling are all great options. Other fat-reducing suggestions include: ? Removing the skin from poultry. ? Removing all visible fats from meats. ? Skimming the fat off of stews, soups, and gravies before serving them. ? Steaming vegetables in water or broth.  Lose weight if you are overweight. Losing just 5-10% of your initial body weight can help your overall health and prevent diseases such as diabetes and heart disease.  Increase your consumption of nuts, legumes, and seeds to 4-5 servings per week. One serving of dried beans or legumes equals  cup after being cooked, one serving of nuts equals 1 ounces, and one serving of seeds equals  ounce or 1 tablespoon.  WHAT ARE GOOD FOODS CAN I EAT? Grains Grainy breads (try to find bread that is 3 g  of fiber per slice or greater), oatmeal, light popcorn. Whole-grain cereals. Rice and pasta, including brown rice and those that are made with whole wheat. Edamame pasta is a great alternative to grain pasta. It has a higher protein content. Try to avoid significant consumption of white bread, sugary cereals, or pastries/baked goods.  Vegetables All vegetables. Cooked white potatoes do not count as vegetables.  Fruits All fruits, but limit pineapple  and bananas as these fruits have a higher sugar content.  Meats and Other Protein Sources Lean, well-trimmed beef, veal, pork, and lamb. Chicken and [place] without skin. All fish and shellfish. Wild duck, rabbit, pheasant, and venison. Egg whites or low-cholesterol egg substitutes. Dried beans, peas, lentils, and tofu.Seeds and most nuts.  Dairy Low-fat or nonfat cheeses, including ricotta, string, and mozzarella. Skim or 1% milk that is liquid, powdered, or evaporated. Buttermilk that is made with low-fat milk. Nonfat or low-fat yogurt. Soy/Almond milk are good alternatives if you cannot handle dairy.  Beverages Water is the best for you. Sports drinks with less sugar are more desirable unless you are a highly active athlete.  Sweets and Desserts Sherbets and fruit ices. Honey, jam, marmalade, jelly, and syrups. Dark chocolate.  Eat all sweets and desserts in moderation.  Fats and Oils Nonhydrogenated (trans-free) margarines. Vegetable oils, including soybean, sesame, sunflower, olive, peanut, safflower, corn, canola, and cottonseed. Salad dressings or mayonnaise that are made with a vegetable oil. Limit added fats and oils that you use for cooking, baking, salads, and as spreads.  Other Cocoa powder. Coffee and tea. Most condiments.  The items listed above may not be a complete list of recommended foods or beverages. Contact your dietitian for more options.

## 2020-05-16 ENCOUNTER — Other Ambulatory Visit: Payer: Self-pay

## 2020-05-16 ENCOUNTER — Other Ambulatory Visit (INDEPENDENT_AMBULATORY_CARE_PROVIDER_SITE_OTHER): Payer: BC Managed Care – PPO

## 2020-05-16 DIAGNOSIS — Z0000 Encounter for general adult medical examination without abnormal findings: Secondary | ICD-10-CM

## 2020-05-16 LAB — COMPREHENSIVE METABOLIC PANEL
ALT: 29 U/L (ref 0–35)
AST: 20 U/L (ref 0–37)
Albumin: 3.9 g/dL (ref 3.5–5.2)
Alkaline Phosphatase: 68 U/L (ref 39–117)
BUN: 19 mg/dL (ref 6–23)
CO2: 30 mEq/L (ref 19–32)
Calcium: 9.8 mg/dL (ref 8.4–10.5)
Chloride: 99 mEq/L (ref 96–112)
Creatinine, Ser: 0.73 mg/dL (ref 0.40–1.20)
GFR: 98.62 mL/min (ref >60.00)
Glucose, Bld: 105 mg/dL — ABNORMAL HIGH (ref 70–99)
Potassium: 4.3 mEq/L (ref 3.5–5.1)
Sodium: 137 mEq/L (ref 135–145)
Total Bilirubin: 0.5 mg/dL (ref 0.2–1.2)
Total Protein: 6.5 g/dL (ref 6.0–8.3)

## 2020-05-16 LAB — LIPID PANEL
Cholesterol: 192 mg/dL (ref 0–200)
HDL: 74.6 mg/dL (ref >39.00)
LDL Cholesterol: 88 mg/dL (ref 0–99)
NonHDL: 117.42
Total CHOL/HDL Ratio: 3
Triglycerides: 146 mg/dL (ref 0.0–149.0)
VLDL: 29.2 mg/dL (ref 0.0–40.0)

## 2020-05-16 LAB — CBC
HCT: 38.8 % (ref 36.0–46.0)
Hemoglobin: 13.2 g/dL (ref 12.0–15.0)
MCHC: 34 g/dL (ref 30.0–36.0)
MCV: 90.5 fl (ref 78.0–100.0)
Platelets: 468 10*3/uL — ABNORMAL HIGH (ref 150.0–400.0)
RBC: 4.28 Mil/uL (ref 3.87–5.11)
RDW: 12.9 % (ref 11.5–15.5)
WBC: 10.6 10*3/uL — ABNORMAL HIGH (ref 4.0–10.5)

## 2020-05-16 LAB — HEMOGLOBIN A1C: Hgb A1c MFr Bld: 5.9 % (ref 4.6–6.5)

## 2020-06-05 ENCOUNTER — Encounter: Payer: Self-pay | Admitting: Family Medicine

## 2020-06-05 ENCOUNTER — Other Ambulatory Visit: Payer: Self-pay

## 2020-06-05 ENCOUNTER — Ambulatory Visit (INDEPENDENT_AMBULATORY_CARE_PROVIDER_SITE_OTHER): Payer: BC Managed Care – PPO | Admitting: Family Medicine

## 2020-06-05 VITALS — BP 110/68 | HR 97 | Temp 98.3°F | Ht 62.0 in | Wt 211.2 lb

## 2020-06-05 DIAGNOSIS — R2689 Other abnormalities of gait and mobility: Secondary | ICD-10-CM

## 2020-06-05 DIAGNOSIS — E669 Obesity, unspecified: Secondary | ICD-10-CM

## 2020-06-05 DIAGNOSIS — R29898 Other symptoms and signs involving the musculoskeletal system: Secondary | ICD-10-CM | POA: Diagnosis not present

## 2020-06-05 DIAGNOSIS — R0600 Dyspnea, unspecified: Secondary | ICD-10-CM | POA: Diagnosis not present

## 2020-06-05 MED ORDER — PHENTERMINE HCL 37.5 MG PO TABS: 37.5000 mg | ORAL_TABLET | Freq: Every day | ORAL | 0 refills | Status: DC

## 2020-06-05 MED ORDER — ALBUTEROL SULFATE HFA 108 (90 BASE) MCG/ACT IN AERS: 2.0000 | INHALATION_SPRAY | Freq: Four times a day (QID) | RESPIRATORY_TRACT | 0 refills | Status: DC | PRN

## 2020-06-05 MED ORDER — PHENTERMINE HCL 37.5 MG PO TABS
37.5000 mg | ORAL_TABLET | Freq: Every day | ORAL | 0 refills | Status: DC
Start: 2020-07-05 — End: 2021-01-04

## 2020-06-05 NOTE — Patient Instructions (Addendum)
If you do not hear anything about your referral in the next 1-2 weeks, call our office and ask for an update.  Keep the diet clean and stay active.  Let us know if you need anything.  Coping skills Choose 5 that work for you:  Take a deep breath  Count to 20  Read a book  Do a puzzle  Meditate  Bake  Sing  Knit  Garden  Pray  Go outside  Call a friend  Listen to music  Take a walk  Color  Send a note  Take a bath  Watch a movie  Be alone in a quiet place  Pet an animal  Visit a friend  Journal  Exercise  Stretch

## 2020-06-05 NOTE — Progress Notes (Signed)
Chief Complaint  Patient presents with  . Follow-up    Medication     Subjective: Patient is a 45 y.o. female here for f/u weight loss.  Seen 1 mo ago and started on Adipex for obesity.  She has lost 4 lbs. Diet is improved, not exercising much due to balance issues and ankle weakness on R. Compliant w Adipex, no AE's.   Over the last few months she will get random bouts of shortness of breath.  No wheezing or coughing, no chest pain.  It will happen randomly in the middle of the night or sometimes when she is just sitting down.  Not always associated with meals, positions, or exertion.  She does not have a history of asthma.  She does have some anxiety/stress and wonders if this could be related.  She denies any upper respiratory symptoms or fevers.  Past Medical History:  Diagnosis Date  . Carpal tunnel syndrome 06/20/2014  . Depression 07/27/2019  . Dysrhythmia    PAPLITATIONS  . Essential hypertension 06/10/2017  . Headache    HX  MIGRAINES  . Hyperlipidemia, mixed 10/10/2014  . Obesity 10/10/2014  . PONV (postoperative nausea and vomiting)   . Preventative health care 09/27/2014   Sees Dr Arrie Aran for GYN  . Wart 10/10/2014    Objective: BP 110/68 (BP Location: Left Arm, Patient Position: Sitting, Cuff Size: Normal)   Pulse 97   Temp 98.3 F (36.8 C) (Oral)   Ht 5\' 2"  (1.575 m)   Wt 211 lb 4 oz (95.8 kg)   SpO2 98%   BMI 38.64 kg/m  General: Awake, appears stated age HEENT: MMM, EOMi Heart: RRR, no murmurs Lungs: CTAB, no rales, wheezes or rhonchi. No accessory muscle use Psych: Age appropriate judgment and insight, normal affect and mood  Assessment and Plan: Obesity (BMI 30-39.9) - Plan: phentermine (ADIPEX-P) 37.5 MG tablet, phentermine (ADIPEX-P) 37.5 MG tablet  Ankle weakness - Plan: Ambulatory referral to Physical Therapy  Balance problem - Plan: Ambulatory referral to Physical Therapy  Dyspnea, unspecified type - Plan: albuterol (VENTOLIN HFA) 108 (90  Base) MCG/ACT inhaler  1.  Continue phentermine for 2 more months.  We will stop after 90 total days.  Counseled on diet and exercise.  She is doing a good job.  She will follow-up with Dr. in 3 months. 2/3.  Refer to physical therapy.  Hopefully this will enable her to feel better about exercising routinely. 4.  Trial albuterol inhaler.  She will monitor her thoughts/anxiety to see if they exacerbate her breathing as well. The patient voiced understanding and agreement to the plan.  Abner Greenspan [place], DO 06/05/20  2:14 PM

## 2020-06-07 ENCOUNTER — Encounter: Payer: BC Managed Care – PPO | Admitting: Medical

## 2020-06-20 ENCOUNTER — Encounter: Payer: BC Managed Care – PPO | Admitting: Medical

## 2020-08-04 ENCOUNTER — Ambulatory Visit: Payer: Self-pay | Admitting: Family Medicine

## 2020-09-05 DIAGNOSIS — R0989 Other specified symptoms and signs involving the circulatory and respiratory systems: Secondary | ICD-10-CM | POA: Diagnosis not present

## 2020-09-05 DIAGNOSIS — J019 Acute sinusitis, unspecified: Secondary | ICD-10-CM | POA: Diagnosis not present

## 2020-09-05 DIAGNOSIS — J209 Acute bronchitis, unspecified: Secondary | ICD-10-CM | POA: Diagnosis not present

## 2020-09-21 DIAGNOSIS — I517 Cardiomegaly: Secondary | ICD-10-CM | POA: Diagnosis not present

## 2020-09-21 DIAGNOSIS — J209 Acute bronchitis, unspecified: Secondary | ICD-10-CM | POA: Diagnosis not present

## 2020-09-21 DIAGNOSIS — R059 Cough, unspecified: Secondary | ICD-10-CM | POA: Diagnosis not present

## 2020-11-04 ENCOUNTER — Other Ambulatory Visit: Payer: Self-pay | Admitting: Family Medicine

## 2020-11-17 ENCOUNTER — Ambulatory Visit (HOSPITAL_BASED_OUTPATIENT_CLINIC_OR_DEPARTMENT_OTHER)
Admission: RE | Admit: 2020-11-17 | Discharge: 2020-11-17 | Disposition: A | Discharge status: Home / Self Care | Payer: 59 | Source: Ambulatory Visit | Attending: Medical | Admitting: Medical

## 2020-11-17 ENCOUNTER — Other Ambulatory Visit: Payer: Self-pay

## 2020-11-17 ENCOUNTER — Ambulatory Visit (INDEPENDENT_AMBULATORY_CARE_PROVIDER_SITE_OTHER): Payer: 59 | Admitting: Medical

## 2020-11-17 VITALS — BP 124/77 | HR 78 | Temp 98.7°F | Resp 18 | Ht 62.0 in | Wt 216.0 lb

## 2020-11-17 DIAGNOSIS — R002 Palpitations: Secondary | ICD-10-CM

## 2020-11-17 DIAGNOSIS — R7989 Other specified abnormal findings of blood chemistry: Secondary | ICD-10-CM

## 2020-11-17 DIAGNOSIS — R600 Localized edema: Secondary | ICD-10-CM

## 2020-11-17 DIAGNOSIS — M7989 Other specified soft tissue disorders: Secondary | ICD-10-CM | POA: Diagnosis not present

## 2020-11-17 LAB — D-DIMER, QUANTITATIVE: D-Dimer, Quant: 0.74 mcg/mL FEU — ABNORMAL HIGH (ref <0.50)

## 2020-11-17 LAB — COMPREHENSIVE METABOLIC PANEL
AG Ratio: 1.7 (calc) (ref 1.0–2.5)
ALT: 84 U/L — ABNORMAL HIGH (ref 6–29)
AST: 49 U/L — ABNORMAL HIGH (ref 10–35)
Albumin: 4.3 g/dL (ref 3.6–5.1)
Alkaline phosphatase (APISO): 108 U/L (ref 31–125)
BUN: 18 mg/dL (ref 7–25)
CO2: 31 mmol/L (ref 20–32)
Calcium: 10.2 mg/dL (ref 8.6–10.2)
Chloride: 99 mmol/L (ref 98–110)
Creat: 0.7 mg/dL (ref 0.50–0.99)
Globulin: 2.6 g/dL (calc) (ref 1.9–3.7)
Glucose, Bld: 108 mg/dL — ABNORMAL HIGH (ref 65–99)
Potassium: 3.9 mmol/L (ref 3.5–5.3)
Sodium: 141 mmol/L (ref 135–146)
Total Bilirubin: 0.5 mg/dL (ref 0.2–1.2)
Total Protein: 6.9 g/dL (ref 6.1–8.1)

## 2020-11-17 NOTE — Progress Notes (Signed)
Subjective:    Patient ID: Tammy Brennan, female    DOB: January 03, 1976, 45 y.o.   MRN: [id number]  HPI  Pt states she has had some bilateral lower extremity edema. She states started on Sunday and was present until Wednesday.   Rt side was more swollen. Pt was not short of breath at the time of the swelling. No dyspnea on exertion. No cough lying supine.  Twisted her rt ankle on Sunday. Inversion ankle walking [place]  Some hand swelling while on vacation. Her rings fit tight.  Pt used to be on diuretic or htn.  Pt considered maybe eating out and high salt foods.   BP is well controlled today.   Pt state hx of heart flutter in the past. Sent to cardiology. Looks like monitor ordered but  never done.  Pt states once or twice this summer she had vibrating sensation to chest.   Occurred twice this summer and last time was one month ago.     Review of Systems  Constitutional:  Negative for appetite change, chills, diaphoresis and fever.  HENT:  Negative for congestion and drooling.   Respiratory:  Negative for cough, chest tightness, shortness of breath and wheezing.   Cardiovascular:  Positive for palpitations. Negative for chest pain.       No chest pain. No recent palpitation sensation.  Gastrointestinal:  Negative for abdominal pain, constipation, nausea and vomiting.  Genitourinary:  Negative for dysuria and enuresis.  Musculoskeletal:  Negative for back pain, joint swelling, myalgias and neck pain.  Skin:  Negative for rash.  Neurological:  Negative for dizziness, seizures, numbness and headaches.  Hematological:  Negative for adenopathy. Does not bruise/bleed easily.  Psychiatric/Behavioral:  Negative for agitation, behavioral problems, decreased concentration and suicidal ideas. The patient is not nervous/anxious.    Past Medical History:  Diagnosis Date   Carpal tunnel syndrome 06/20/2014   Depression 07/27/2019   Dysrhythmia    PAPLITATIONS    Essential hypertension 06/10/2017   Headache    HX  MIGRAINES   Hyperlipidemia, mixed 10/10/2014   Obesity 10/10/2014   PONV (postoperative nausea and vomiting)    Preventative health care 09/27/2014   Sees Dr Arrie Aran for GYN   Wart 10/10/2014     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[employer]  Other Topics Concern   Not on file  Social History Narrative   Not on file   Social Determinants of Health   Financial Resource Strain: Not on file  Food Insecurity: Not on file  Transportation Needs: Not on file  Physical Activity: Not on file  Stress: Not on file  Social Connections: Not on file  Intimate Partner Violence: Not on file    Past Surgical History:  Procedure Laterality Date   ABDOMINAL EXPOSURE N/A 07/14/2017   Procedure: ABDOMINAL EXPOSURE;  Surgeon: Larina Earthly,  MD;  Location: [facility] OR;  Service: Vascular;  Laterality: N/A;   ANTERIOR LUMBAR FUSION N/A 07/14/2017   Procedure: Anterior Lumbar Interbody Fusion Lumbar five-Sacral one;  Surgeon: Donalee Citrin, MD;  Location: [facility] OR;  Service: Neurosurgery;  Laterality: N/A;   CESAREAN SECTION     X2    Family History  Problem Relation Age of Onset   Hypertension Mother    Hyperlipidemia Mother    Hypertension Father    Cancer Father        lung, previous light smoker   Alcoholism Father    Alcohol abuse Maternal Grandfather    Heart disease Maternal Grandfather        MI   Dementia Paternal  Grandfather    Hypertension Sister     No Known Allergies  Current Outpatient Medications on File Prior to Visit  Medication Sig Dispense Refill   albuterol (VENTOLIN HFA) 108 (90 Base) MCG/ACT inhaler Inhale 2 puffs into the lungs every 6 (six) hours as needed for wheezing or shortness of breath. 8 g 0   aspirin 81 MG chewable tablet Chew by mouth daily.     aspirin-acetaminophen-caffeine (EXCEDRIN MIGRAINE) 250-250-65 MG tablet Take 2 tablets by mouth 2 (two) times daily as needed for headache.     Biotin 5000 MCG TABS Take 5,000 mcg by mouth daily.     losartan-hydrochlorothiazide (HYZAAR) 100-12.5 MG tablet TAKE 1 TABLET BY MOUTH DAILY 90 tablet 0   Multiple Vitamin (MULTIVITAMIN) tablet Take 1 tablet by mouth daily.     norethindrone-ethinyl estradiol (MICROGESTIN,JUNEL,LOESTRIN) 1-20 MG-MCG tablet Take 1 tablet by mouth daily.     phentermine (ADIPEX-P) 37.5 MG tablet Take 1 tablet (37.5 mg total) by mouth daily before breakfast. 30 tablet 0   phentermine (ADIPEX-P) 37.5 MG tablet Take 1 tablet (37.5 mg total) by mouth daily before breakfast. 30 tablet 0   valACYclovir (VALTREX) 1000 MG tablet Take 2 g by mouth 2 (two) times daily as needed. Cold sores.  6   No current facility-administered medications on file prior to visit.    BP 124/77   Pulse 78   Temp 98.7 F (37.1 C)   Resp 18   Ht 5\' 2"  (1.575 m)   Wt 216 lb (98 kg)   SpO2 98%   BMI 39.51 kg/m        Objective:   Physical Exam  General- No acute distress. Pleasant patient. Neck- Full range of motion, no jvd Lungs- Clear, even and unlabored. Heart- regular rate and rhythm. Neurologic- CNII- XII grossly intact.   Lower extremity-calves symmetric.  Negative Homans' sign bilaterally.  Right ankle-no bruising, swelling or tenderness to palpation.      Assessment & Plan:   Patient Instructions  Recent described swelling of hands and bilateral lower extremity pedal edema.  This occurred while on vacation and  report possibly eating a lot of salty foods.  Since Wednesday the swelling has resolved.  However on further discussion you question  if pedal edema could have been correlated with recent palpitation/vibrating sensation in the left side chest that occurred twice over the summer.  Discussed I think recent swelling might have been result of high salt diet.  However since you did report chest vibration type sensation considered remote possibility of paroxysmal atrial fibrillation versus other type rhythms such as PVC.  As we approached the weekend decided to go ahead and get CMP, BNP, EKG and chest x-ray.  In light of prior palpitation decided to go ahead and  refer you back to cardiologis so that he can complete former recommended work-up.  Appears that cardiologist was Holter monitor.  I recommend that you avoid any caffeine as that can sometimes induce palpitations.  Since she did reported more swelling on right calf when you had the swelling decided to get D-dimer stat today.  If that were to be elevated then would need to order right lower extremity ultrasound.  Follow-up in 7 to 10 days or sooner if needed.   Esperanza Richters, PA-C

## 2020-11-17 NOTE — Addendum Note (Signed)
Addended by: Rosita Kea on: 11/17/2020 04:06 PM   Modules accepted: Orders

## 2020-11-17 NOTE — Addendum Note (Signed)
Addended by: Gwenevere Abbot on: 11/17/2020 09:21 PM   Modules accepted: Orders

## 2020-11-17 NOTE — Addendum Note (Signed)
Addended by: Gwenevere Abbot on: 11/17/2020 09:13 PM   Modules accepted: Orders

## 2020-11-17 NOTE — Patient Instructions (Addendum)
Recent described swelling of hands and bilateral lower extremity pedal edema.  This occurred while on vacation and report possibly eating a lot of salty foods.  Since Wednesday the swelling has resolved.  However on further discussion you question  if pedal edema could have been correlated with recent palpitation/vibrating sensation in the left side chest that occurred twice over the summer.  Discussed that  I think recent swelling might have been result of high salt diet.  However since you did report chest vibration type sensation considered remote possibility of paroxysmal atrial fibrillation versus other type rhythms such as PVC.  Ekg showd sinus rhythm.   As we approached the weekend decided to go ahead and get CMP, BNP, EKG and chest x-ray.  In light of prior palpitation decided to go ahead and refer you back to cardiologist so that he can complete former recommended work-up.  Appears that cardiologist was Holter monitor.  I recommend that you avoid any caffeine as that can sometimes induce palpitations.  Since she did reported more swelling on right calf when you had the swelling decided to get D-dimer stat today.  If that were to be elevated then would need to order right lower extremity ultrasound.  Follow-up in 7 to 10 days or sooner if needed.

## 2020-11-18 ENCOUNTER — Ambulatory Visit (HOSPITAL_BASED_OUTPATIENT_CLINIC_OR_DEPARTMENT_OTHER)
Admission: RE | Admit: 2020-11-18 | Discharge: 2020-11-18 | Disposition: A | Discharge status: Home / Self Care | Payer: 59 | Source: Ambulatory Visit | Attending: Medical | Admitting: Medical

## 2020-11-18 ENCOUNTER — Encounter: Payer: Self-pay | Admitting: Medical

## 2020-11-18 DIAGNOSIS — M7989 Other specified soft tissue disorders: Secondary | ICD-10-CM | POA: Insufficient documentation

## 2020-11-18 DIAGNOSIS — R600 Localized edema: Secondary | ICD-10-CM | POA: Insufficient documentation

## 2020-11-18 DIAGNOSIS — R7989 Other specified abnormal findings of blood chemistry: Secondary | ICD-10-CM | POA: Insufficient documentation

## 2020-11-18 LAB — BRAIN NATRIURETIC PEPTIDE: Brain Natriuretic Peptide: 4 pg/mL (ref <100)

## 2020-11-21 ENCOUNTER — Telehealth: Payer: Self-pay | Admitting: Medical

## 2020-11-21 ENCOUNTER — Other Ambulatory Visit: Payer: Self-pay

## 2020-11-21 ENCOUNTER — Ambulatory Visit (INDEPENDENT_AMBULATORY_CARE_PROVIDER_SITE_OTHER): Payer: 59 | Admitting: Medical

## 2020-11-21 VITALS — BP 118/69 | HR 84 | Resp 18 | Ht 62.0 in | Wt 213.0 lb

## 2020-11-21 DIAGNOSIS — R600 Localized edema: Secondary | ICD-10-CM | POA: Diagnosis not present

## 2020-11-21 DIAGNOSIS — M7989 Other specified soft tissue disorders: Secondary | ICD-10-CM

## 2020-11-21 DIAGNOSIS — R0683 Snoring: Secondary | ICD-10-CM

## 2020-11-21 DIAGNOSIS — R748 Abnormal levels of other serum enzymes: Secondary | ICD-10-CM

## 2020-11-21 DIAGNOSIS — R7989 Other specified abnormal findings of blood chemistry: Secondary | ICD-10-CM | POA: Diagnosis not present

## 2020-11-21 DIAGNOSIS — R002 Palpitations: Secondary | ICD-10-CM | POA: Diagnosis not present

## 2020-11-21 DIAGNOSIS — R0789 Other chest pain: Secondary | ICD-10-CM

## 2020-11-21 DIAGNOSIS — Z6835 Body mass index (BMI) 35.0-35.9, adult: Secondary | ICD-10-CM

## 2020-11-21 NOTE — Patient Instructions (Addendum)
For elevated liver enzymes will get liver US. Can discuss with radiology today and get scheduled for that.   Resolved pedal edema and swelling of hands have resolved. I think high salt foods and dependant lower edema recently.  D dimer elevated but no dvt by [place] in either leg.  Mild vascular congestion. No recurrent palpitation. Referral to cardiologist in place. They may do echo and holter.  02 sat good in office today. No sob.  Will discuss with Dr. Abner Greenspan your d dimer see if she recommends ct chest. I would like her opinion as not entirely clear that is needed presently.  For snoring refer to specialist for sleep apnea evaluation.  For obesity recommend wt loss. Wt loss clinic option.  Follow up 2-3 months or as needed.  Did discuss recent work up and labs with pcp Dr. Abner Greenspan. She recommends [place] proceeding with ct chest to rule out PE.

## 2020-11-21 NOTE — Telephone Encounter (Signed)
Dr. Abner Greenspan,  I sent you last 2 notes/avs on pt of yours. I have seen her twice. Tried to catch you today before you left.  If you see ordered d dimer early on with hopes would be negative as did not want to to order [place] due to low suspicion.     Pt does not have DVT by [place], no obvious dyspnea and 02 sat for most part stayed about 96% on ambulation in office. When I checked at rest 98%.  I just don't know what to make/how to explain the mild vascular congestion on xray. Pt bnp was normal. I have her scheduled with cardiologist eventually to address long history of palpitation. I imagine they will get echo and holter.  Considered ct chest but not convinced needed. Wanted your opinion.  Thanks, Esperanza Richters, PA-C

## 2020-11-21 NOTE — Progress Notes (Signed)
Subjective:    Patient ID: Tammy Brennan, female    DOB: 07-26-1975, 45 y.o.   MRN: [id number]  HPI  Pt in for follow up for recent pedal edema. That was present while on vacation but then resolved by the time I saw her. Some palpitation type symptoms as well since described occasional vibrating sensation to chest.  Pt cxr showed mild vascular congestion. No enlarged heart. Cmp normal and bnp not elevated.  D dimer done since legs/calfs were in swollen and one side was larger than other.   D dimer was elevated and and wanted pt to come in today to assess.   I did put in referral to cardiologist on last visit due to palpitation. I expect that will eventually get echo. Maybe holter as that was planned in the past.  Hands are no longer swelling.  Mild has mild elevated liver enzymes recently. No alcohol use.    Cardiologist office called and gave office appointment end of October/  Review of Systems  Constitutional:  Negative for chills, fatigue and fever.  Respiratory:  Negative for cough, chest tightness, shortness of breath and wheezing.   Cardiovascular:  Negative for chest pain and palpitations.  Gastrointestinal:  Negative for abdominal pain.  Genitourinary:  Negative for difficulty urinating, dysuria, frequency, menstrual problem and urgency.  Musculoskeletal:  Negative for back pain and joint swelling.  Neurological:  Negative for dizziness, syncope, light-headedness, numbness and headaches.  Hematological:  Negative for adenopathy. Does not bruise/bleed easily.  Psychiatric/Behavioral:  Negative for behavioral problems, confusion and decreased concentration.     Past Medical History:  Diagnosis Date   Carpal tunnel syndrome 06/20/2014   Depression 07/27/2019   Dysrhythmia    PAPLITATIONS   Essential hypertension 06/10/2017   Headache    HX  MIGRAINES   Hyperlipidemia, mixed 10/10/2014   Obesity 10/10/2014   PONV (postoperative nausea and vomiting)    Preventative  health care 09/27/2014   Sees Dr Arrie Aran for GYN   Wart 10/10/2014     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[employer]  Other Topics Concern   Not on file  Social History Narrative   Not on file   Social Determinants of Health   Financial Resource Strain: Not on file  Food Insecurity: Not on file  Transportation Needs: Not on file  Physical Activity: Not on file  Stress: Not on file  Social Connections: Not on file  Intimate Partner Violence: Not on file    Past Surgical History:  Procedure Laterality Date   ABDOMINAL EXPOSURE N/A 07/14/2017   Procedure: ABDOMINAL EXPOSURE;  Surgeon: Larina Earthly, MD;  Location: [facility] OR;  Service: Vascular;  Laterality: N/A;   ANTERIOR LUMBAR FUSION N/A 07/14/2017   Procedure: Anterior Lumbar Interbody Fusion Lumbar five-Sacral one;  Surgeon: Donalee Citrin, MD;  Location: [facility] OR;  Service: Neurosurgery;  Laterality: N/A;   CESAREAN SECTION     X2    Family History  Problem Relation Age of Onset   Hypertension Mother    Hyperlipidemia Mother    Hypertension Father    Cancer Father        lung, previous light smoker   Alcoholism Father    Alcohol abuse Maternal Grandfather    Heart disease Maternal Grandfather        MI   Dementia Paternal Grandfather    Hypertension Sister     No Known Allergies  Current Outpatient Medications on File Prior to Visit  Medication Sig Dispense Refill   aspirin 81 MG  chewable tablet Chew by mouth daily.     aspirin-acetaminophen-caffeine (EXCEDRIN MIGRAINE) 250-250-65 MG tablet Take 2 tablets by mouth 2 (two) times daily as needed for headache.     Biotin 5000 MCG TABS Take 5,000 mcg by mouth daily.     losartan-hydrochlorothiazide (HYZAAR) 100-12.5 MG tablet TAKE 1 TABLET BY MOUTH DAILY 90 tablet 0   Multiple Vitamin (MULTIVITAMIN) tablet Take 1 tablet by mouth daily.     valACYclovir (VALTREX) 1000 MG tablet Take 2 g by mouth 2 (two) times daily as needed. Cold sores.  6   albuterol (VENTOLIN HFA) 108 (90 Base) MCG/ACT inhaler Inhale 2 puffs into the lungs every 6 (six) hours as needed for wheezing or shortness of breath. 8 g 0   norethindrone-ethinyl estradiol (MICROGESTIN,JUNEL,LOESTRIN) 1-20 MG-MCG tablet Take 1 tablet by mouth daily.     phentermine (ADIPEX-P) 37.5 MG tablet Take 1 tablet (37.5 mg total) by mouth daily before breakfast. 30 tablet 0   phentermine (ADIPEX-P) 37.5 MG tablet Take 1 tablet (37.5 mg total) by mouth daily before breakfast. 30 tablet 0   No current facility-administered medications on file prior to visit.    BP 118/69   Pulse 84   Resp 18   Ht 5\' 2"  (1.575 m)   Wt 213 lb (96.6 kg)   SpO2 100%   BMI 38.96 kg/m  02 sat before walk when I checked was 98%. Walking down hall dropped to 96%. Briefly down to 95%. Then came back up to 96%.       Objective:   Physical Exam  General Mental Status- Alert. General Appearance- Not in acute distress.   Skin General: Color- Normal Color. Moisture- Normal Moisture.  Neck Carotid Arteries- Normal color. Moisture- Normal Moisture. No carotid bruits. No JVD.  Chest and Lung Exam Auscultation: Breath Sounds:-Normal.  Cardiovascular Auscultation:Rythm- Regular. Murmurs & Other Heart Sounds:Auscultation of the heart reveals- No Murmurs.  Abdomen Inspection:-Inspeection Normal. Palpation/Percussion:Note:No mass. Palpation and Percussion of the abdomen reveal- Non Tender,  Non Distended + BS, no rebound or guarding.   Neurologic Cranial Nerve exam:- CN III-XII intact(No nystagmus), symmetric smile. Strength:- 5/5 equal and symmetric strength both upper and lower extremities.   Lower ext- calfs symmetric. No pedal edema.      Assessment & Plan:   Patient Instructions  For elevated liver enzymes will get liver . Can discuss with radiology today and get scheduled for that.   Resolved pedal edema and swelling of hands have resolved. I think high salt foods and dependant lower edema recently.  D dimer elevated but no dvt by [place] in either leg.  Mild vascular congestion. No recurrent palpitation. Referral to cardiologist in place. They may do echo and holter.  02 sat good in office today. No sob.  Will discuss with Dr. [place] your d dimer see  if she recommends ct chest. I would like her opinion as not entirely clear.  For snoring refer to specialist for sleep apnea evaluation.  For obesity recommend wt loss. Wt loss clinic option.  Follow up 2-3 months or as needed.   Esperanza Richters, PA-C   Time spent with patient today was 41 minutes which consisted of chart review, discussing diagnosis, work up, treatment, getting pcp opinion and documentation.

## 2020-11-22 ENCOUNTER — Telehealth (HOSPITAL_BASED_OUTPATIENT_CLINIC_OR_DEPARTMENT_OTHER): Payer: Self-pay

## 2020-11-22 ENCOUNTER — Ambulatory Visit (HOSPITAL_BASED_OUTPATIENT_CLINIC_OR_DEPARTMENT_OTHER): Payer: 59

## 2020-11-22 NOTE — Addendum Note (Signed)
Addended by: Gwenevere Abbot on: 11/22/2020 08:48 AM   Modules accepted: Orders

## 2020-11-23 ENCOUNTER — Ambulatory Visit (HOSPITAL_BASED_OUTPATIENT_CLINIC_OR_DEPARTMENT_OTHER): Admission: RE | Admit: 2020-11-23 | Payer: 59 | Source: Ambulatory Visit

## 2020-11-23 ENCOUNTER — Encounter: Payer: Self-pay | Admitting: Medical

## 2020-11-23 NOTE — Telephone Encounter (Signed)
Pt called office

## 2020-11-24 ENCOUNTER — Encounter: Payer: Self-pay | Admitting: Medical

## 2020-11-24 ENCOUNTER — Other Ambulatory Visit: Payer: Self-pay

## 2020-11-24 ENCOUNTER — Ambulatory Visit (HOSPITAL_BASED_OUTPATIENT_CLINIC_OR_DEPARTMENT_OTHER)
Admission: RE | Admit: 2020-11-24 | Discharge: 2020-11-24 | Disposition: A | Discharge status: Home / Self Care | Payer: 59 | Source: Ambulatory Visit | Attending: Medical | Admitting: Medical

## 2020-11-24 DIAGNOSIS — R600 Localized edema: Secondary | ICD-10-CM | POA: Diagnosis present

## 2020-11-24 DIAGNOSIS — M7989 Other specified soft tissue disorders: Secondary | ICD-10-CM | POA: Diagnosis present

## 2020-11-24 DIAGNOSIS — R0789 Other chest pain: Secondary | ICD-10-CM | POA: Diagnosis present

## 2020-11-24 DIAGNOSIS — R7989 Other specified abnormal findings of blood chemistry: Secondary | ICD-10-CM | POA: Insufficient documentation

## 2020-11-24 MED ORDER — IOHEXOL 350 MG/ML SOLN
100.0000 mL | Freq: Once | INTRAVENOUS | Status: AC | PRN
  Administered 2020-11-24: 80 mL via INTRAVENOUS

## 2020-12-01 ENCOUNTER — Other Ambulatory Visit: Payer: Self-pay

## 2020-12-01 ENCOUNTER — Telehealth: Payer: Self-pay | Admitting: Family Medicine

## 2020-12-01 ENCOUNTER — Other Ambulatory Visit (HOSPITAL_BASED_OUTPATIENT_CLINIC_OR_DEPARTMENT_OTHER): Payer: 59

## 2020-12-01 ENCOUNTER — Ambulatory Visit (HOSPITAL_BASED_OUTPATIENT_CLINIC_OR_DEPARTMENT_OTHER): Payer: 59

## 2020-12-01 ENCOUNTER — Ambulatory Visit (HOSPITAL_BASED_OUTPATIENT_CLINIC_OR_DEPARTMENT_OTHER)
Admission: RE | Admit: 2020-12-01 | Discharge: 2020-12-01 | Disposition: A | Discharge status: Home / Self Care | Payer: 59 | Source: Ambulatory Visit | Attending: Medical | Admitting: Medical

## 2020-12-01 DIAGNOSIS — R748 Abnormal levels of other serum enzymes: Secondary | ICD-10-CM | POA: Insufficient documentation

## 2020-12-01 NOTE — Telephone Encounter (Signed)
Started back last night, she elevated and iced, went down some but now its pretty swollen & purple(black and blue) almost bruised. Does not hurt!    Patient is wanting to know what she needs to do.. OV or ER???

## 2020-12-01 NOTE — Telephone Encounter (Signed)
Called got busy signal

## 2020-12-05 NOTE — Telephone Encounter (Signed)
Left message on machine to call back to check status.  Not sure if she went to urgent care or not.  Advised that she would need an office visit to evaluate if still needed.

## 2021-01-01 DIAGNOSIS — I499 Cardiac arrhythmia, unspecified: Secondary | ICD-10-CM | POA: Insufficient documentation

## 2021-01-01 DIAGNOSIS — R519 Headache, unspecified: Secondary | ICD-10-CM | POA: Insufficient documentation

## 2021-01-03 NOTE — Progress Notes (Signed)
Cardiology Office Note:    Date:  01/04/2021   ID:  Tammy Brennan, DOB 02-Mar-1976, MRN [id number]  PCP:  Bradd Canary, MD  Cardiologist:  Norman Herrlich, MD   Referring MD: Esperanza Richters, PA-C  ASSESSMENT:    1. Palpitations   2. Essential hypertension    PLAN:    In order of problems listed above:  She has a longstanding history of palpitation worsened recently occurring frequently at least daily and I told her I think the first thing for [place] to do is document if she has arrhythmia and make a decision regarding treatment.  She agrees we will apply a 7-day event monitor today and leave further follow-up as needed if she has significant arrhythmia I will bring her back to the office for further evaluation and treatment and if unremarkable will reassurance.  Routine studies thyroid CBC potassium are all normal.  At this time I do not think she needs an ischemia evaluation or cardiac imaging. Stable continue her ARB thiazide diuretic  Next appointment as needed   Medication Adjustments/Labs and Tests Ordered: Current medicines are reviewed at length with the patient today.  Concerns regarding medicines are outlined above.  Orders Placed This Encounter  Procedures   LONG TERM MONITOR (3-14 DAYS)    No orders of the defined types were placed in this encounter.     Chief complaint, continues to have palpitation  History of Present Illness:    Tammy Brennan is a 45 y.o. female who is being seen today for the evaluation of palpitation at the request of [place], Ramon Dred[place], [place].  She was seen in April 2019 by my partner Dr. Bing Matter with complaints of palpitation.  She was advised an event monitor but never performed.  Recent testing included a CT angio of the chest showing no findings of pulmonary embolism and the cardiovascular structures are normal except for anomoly of origin of the right subclavian artery.  Lower extremity venous duplex showed no evidence of DVT.  EKG  11/17/2020 showed sinus rhythm normal independently reviewed  Her EKG 11/17/2020 showed sinus rhythm and was normal  She has a long history of palpitation for several years. It occurs without a pattern with and without activity day or night and its momentary fluttering.  Its not severe or sustained. She has not had syncope not exertional in nature. Her sister has atrial fibrillation her mother had some heart problem she is unaware of the exact nature of it. She is not having chest pain edema shortness of breath or syncope. She has not captured any episodes with a smart watch. Past Medical History:  Diagnosis Date   Carpal tunnel syndrome 06/20/2014   Depression 07/27/2019   Dysrhythmia    PAPLITATIONS   Essential hypertension 06/10/2017   Headache    HX  MIGRAINES   Hyperlipidemia, mixed 10/10/2014   Obesity 10/10/2014   PONV (postoperative nausea and vomiting)    Preventative health care 09/27/2014   Sees Dr Arrie Aran for GYN   Wart 10/10/2014    Past Surgical History:  Procedure Laterality Date   ABDOMINAL EXPOSURE N/A 07/14/2017   Procedure: ABDOMINAL EXPOSURE;  Surgeon: Larina Earthly, MD;  Location: [facility] OR;  Service: Vascular;  Laterality: N/A;   ANTERIOR LUMBAR FUSION N/A 07/14/2017   Procedure: Anterior Lumbar Interbody Fusion Lumbar five-Sacral one;  Surgeon: Donalee Citrin, MD;  Location: [facility] OR;  Service: Neurosurgery;  Laterality: N/A;   CESAREAN SECTION     X2  Current Medications: Current Meds  Medication Sig   aspirin 81 MG chewable tablet Chew by mouth daily.   aspirin-acetaminophen-caffeine (EXCEDRIN MIGRAINE) 250-250-65 MG tablet Take 2 tablets by mouth 2 (two) times daily as needed for headache.   Biotin 5000 MCG TABS Take 5,000 mcg by mouth daily.   losartan-hydrochlorothiazide (HYZAAR) 100-12.5 MG tablet TAKE 1 TABLET BY MOUTH DAILY   Multiple Vitamin (MULTIVITAMIN) tablet Take 1 tablet by mouth daily.   valACYclovir (VALTREX) 1000 MG tablet Take 2 g by mouth 2 (two)  times daily as needed. Cold sores.     Allergies:   Patient has no known allergies.   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[employer]  Other Topics Concern   Not on file  Social History Narrative   Not on file   Social Determinants of Health   Financial Resource Strain: Not on file  Food Insecurity: Not on file  Transportation Needs: Not on file  Physical Activity: Not on file  Stress: Not on file  Social Connections: Not on file     Family History: The patient's family history includes Alcohol abuse in her maternal grandfather; Alcoholism in her father; Cancer in her father; Dementia in her paternal grandfather; Heart disease in her maternal grandfather; Hyperlipidemia in her mother; Hypertension in her father, mother, and sister.  ROS:   ROS Please see the history of present illness.     All other systems reviewed and are negative.  EKGs/Labs/Other Studies Reviewed:    The following studies were reviewed today:     Recent Labs: 05/16/2020: Hemoglobin 13.2; Platelets 468.0 11/17/2020: ALT 84; Brain Natriuretic Peptide 4; BUN 18; Creat 0.70; Potassium 3.9; Sodium 141  Recent Lipid Panel    Component Value Date/Time   CHOL 192 05/16/2020 0714   CHOL 176 09/16/2019 1354   TRIG 146.0 05/16/2020 0714   HDL 74.60 05/16/2020 0714   HDL 68 09/16/2019 1354   CHOLHDL 3 05/16/2020 0714   VLDL 29.2 05/16/2020 0714   LDLCALC 88 05/16/2020 0714    LDLCALC 71 09/16/2019 1354   LDLDIRECT 96.0 05/03/2019 1414    Physical Exam:    VS:  BP 138/70 (BP Location: Right Arm, Patient Position: Sitting, Cuff Size: Normal)   Pulse 78   Ht 5\' 2"  (1.575 m)   Wt 214 lb (97.1 kg)   BMI 39.14 kg/m     Wt Readings from Last 3 Encounters:  01/04/21 214 lb (97.1 kg)  11/21/20 213 lb (96.6 kg)  11/17/20 216 lb (98 kg)     GEN:  Well nourished, well developed in no acute distress HEENT: Normal NECK: No JVD; No carotid bruits LYMPHATICS: No lymphadenopathy CARDIAC: RRR, no murmurs, rubs, gallops RESPIRATORY:  Clear to auscultation without rales, wheezing or rhonchi  ABDOMEN: Soft, non-tender, non-distended MUSCULOSKELETAL:  No edema; No deformity  SKIN: Warm and dry NEUROLOGIC:  Alert and oriented x 3 PSYCHIATRIC:  Normal affect     Signed, Norman Herrlich, MD  01/04/2021 2:16 PM    [facility]

## 2021-01-04 ENCOUNTER — Other Ambulatory Visit: Payer: Self-pay

## 2021-01-04 ENCOUNTER — Ambulatory Visit (INDEPENDENT_AMBULATORY_CARE_PROVIDER_SITE_OTHER): Payer: BC Managed Care – PPO

## 2021-01-04 ENCOUNTER — Encounter: Payer: Self-pay | Admitting: Cardiology

## 2021-01-04 ENCOUNTER — Ambulatory Visit (INDEPENDENT_AMBULATORY_CARE_PROVIDER_SITE_OTHER): Payer: BC Managed Care – PPO | Admitting: Cardiology

## 2021-01-04 VITALS — BP 138/70 | HR 78 | Ht 62.0 in | Wt 214.0 lb

## 2021-01-04 DIAGNOSIS — I10 Essential (primary) hypertension: Secondary | ICD-10-CM

## 2021-01-04 DIAGNOSIS — R002 Palpitations: Secondary | ICD-10-CM

## 2021-01-04 NOTE — Patient Instructions (Addendum)
Medication Instructions:  Your physician recommends that you continue on your current medications as directed. Please refer to the Current Medication list given to you today.  *If you need a refill on your cardiac medications before your next appointment, please call your pharmacy*   Lab Work: None If you have labs (blood work) drawn today and your tests are completely normal, you will receive your results only by: . MyChart Message (if you have MyChart) OR . A paper copy in the mail If you have any lab test that is abnormal or we need to change your treatment, we will call you to review the results.   Testing/Procedures: A zio monitor was ordered today. It will remain on for 7 days. You will then return monitor and event diary in provided box. It takes 1-2 weeks for report to be downloaded and returned to us. We will call you with the results. If monitor falls off or has orange flashing light, please call Zio for further instructions.      Follow-Up: At [facility], you and your health needs are our priority.  As part of our continuing mission to provide you with exceptional heart care, we have created designated Provider Care Teams.  These Care Teams include your primary Cardiologist (physician) and Advanced Practice Providers (APPs -  Physician Assistants and Nurse Practitioners) who all work together to provide you with the care you need, when you need it.  We recommend signing up for the patient portal called "MyChart".  Sign up information is provided on this After Visit Summary.  MyChart is used to connect with patients for Virtual Visits (Telemedicine).  Patients are able to view lab/test results, encounter notes, upcoming appointments, etc.  Non-urgent messages can be sent to your provider as well.   To learn more about what you can do with MyChart, go to [email or web address].    Your next appointment:   As needed  The format for your next appointment:   In  Person  Provider:   Brian Munley, MD   Other Instructions 1. Avoid all over-the-counter antihistamines except Claritin/Loratadine and Zyrtec/Cetrizine. 2. Avoid all combination including cold sinus allergies flu decongestant and sleep medications 3. You can use Robitussin DM Mucinex and Mucinex DM for cough. 4. can use Tylenol aspirin ibuprofen and naproxen but no combinations such as sleep or sinus.    

## 2021-01-17 DIAGNOSIS — R002 Palpitations: Secondary | ICD-10-CM | POA: Diagnosis not present

## 2021-01-26 ENCOUNTER — Telehealth: Payer: Self-pay

## 2021-01-26 NOTE — Telephone Encounter (Signed)
-----   Message from Baldo Daub, MD sent at 01/26/2021  4:41 PM EST ----- Normal or stable result  Good result normal monitor, she can follow-up as needed.

## 2021-01-26 NOTE — Telephone Encounter (Signed)
Left message on patients voicemail to please return our call.   

## 2021-01-29 ENCOUNTER — Other Ambulatory Visit: Payer: Self-pay | Admitting: Family Medicine

## 2021-01-30 ENCOUNTER — Ambulatory Visit: Payer: BC Managed Care – PPO | Admitting: Family Medicine

## 2021-02-06 ENCOUNTER — Other Ambulatory Visit: Payer: Self-pay

## 2021-02-06 ENCOUNTER — Ambulatory Visit (INDEPENDENT_AMBULATORY_CARE_PROVIDER_SITE_OTHER): Payer: BC Managed Care – PPO | Admitting: Family

## 2021-02-06 DIAGNOSIS — R400 Somnolence: Secondary | ICD-10-CM

## 2021-02-06 DIAGNOSIS — R29898 Other symptoms and signs involving the musculoskeletal system: Secondary | ICD-10-CM

## 2021-02-06 DIAGNOSIS — I10 Essential (primary) hypertension: Secondary | ICD-10-CM

## 2021-02-06 DIAGNOSIS — K760 Fatty (change of) liver, not elsewhere classified: Secondary | ICD-10-CM

## 2021-02-06 DIAGNOSIS — R7303 Prediabetes: Secondary | ICD-10-CM

## 2021-02-06 HISTORY — DX: Somnolence: R40.0

## 2021-02-06 HISTORY — DX: Fatty (change of) liver, not elsewhere classified: K76.0

## 2021-02-06 HISTORY — DX: Other symptoms and signs involving the musculoskeletal system: R29.898

## 2021-02-06 NOTE — Assessment & Plan Note (Signed)
BP Readings from Last 3 Encounters:  02/06/21 134/77  01/04/21 138/70  11/21/20 118/69   BP stable, continue hyzaar 100/12.5.

## 2021-02-06 NOTE — Assessment & Plan Note (Signed)
Discussed hyperglycemia, diet/exercise/weight loss to combat progression to overt DM2.

## 2021-02-06 NOTE — Assessment & Plan Note (Signed)
Noted on [place]. Discussed importance of low fat diet, exercise and weight loss.

## 2021-02-06 NOTE — Assessment & Plan Note (Signed)
Unchanged. Will re-initiate referral to sleep specialist for home sleep study.

## 2021-02-06 NOTE — Progress Notes (Signed)
Subjective:   By signing my name below, I, Lyric Barr-McArthur, attest that this documentation has been prepared under the direction and in the presence of [name], NP, 02/06/2021   Patient ID: Tammy Brennan, female    DOB: 22-Jun-1975, 45 y.o.   MRN: [id number]  Chief Complaint  Patient presents with   Follow-up    HPI Patient is in today for an office visit.  Swelling: She originally went to go see [employer] because of swelling in her feet. Blood work was taken and her liver function was elevated. Further tests were performed which showed that she had fatty liver. She reports resolution of LE edema.  She had a CTA which was negative for PE last visit as well as neg bilateral LE dopplers.  Blood pressure: Her blood pressure is within good range and she is compliant in taking 12.5 mg losartan-hydrochlorothiazide.  BP Readings from Last 3 Encounters:  02/06/21 134/77  01/04/21 138/70  11/21/20 118/69  Blood sugar: The last few times that she has had her blood sugar monitored it has been high. She has been advised to improve her diet, exercise levels and weight loss.  Lab Results  Component Value Date   HGBA1C 5.9 05/16/2020  Ankle: She reports weakness and pain in her right ankle. She notes an injury over 20 years ago to her ankle but is just now recently having pain in it. She is interested in having this looked at further.  Immunizations: She has already received her flu shot this season.   Health Maintenance Due  Topic Date Due   COVID-19 Vaccine (1) Never done   HIV Screening  Never done   Hepatitis C Screening  Never done   TETANUS/TDAP  Never done   COLONOSCOPY (Pts 45-20yrs Insurance coverage will need to be confirmed)  Never done   MAMMOGRAM  09/26/2020   INFLUENZA VACCINE  10/09/2020    Past Medical History:  Diagnosis Date   Carpal tunnel syndrome 06/20/2014   Depression 07/27/2019   Dysrhythmia    PAPLITATIONS   Essential hypertension 06/10/2017    Headache    HX  MIGRAINES   Hyperlipidemia, mixed 10/10/2014   Obesity 10/10/2014   PONV (postoperative nausea and vomiting)    Preventative health care 09/27/2014   Sees Dr Arrie Aran for GYN   Wart 10/10/2014    Past Surgical History:  Procedure Laterality Date   ABDOMINAL EXPOSURE N/A 07/14/2017   Procedure: ABDOMINAL EXPOSURE;  Surgeon: Larina Earthly, MD;  Location: [facility] OR;  Service: Vascular;  Laterality: N/A;   ANTERIOR LUMBAR FUSION N/A 07/14/2017   Procedure: Anterior Lumbar Interbody Fusion Lumbar five-Sacral one;  Surgeon: Donalee Citrin, MD;  Location: [facility] OR;  Service: Neurosurgery;  Laterality: N/A;   CESAREAN SECTION     X2    Family History  Problem Relation Age of Onset   Hypertension Mother    Hyperlipidemia Mother    Hypertension Father    Cancer Father        lung, previous light smoker   Alcoholism Father    Alcohol abuse Maternal Grandfather    Heart disease Maternal Grandfather        MI   Dementia Paternal Grandfather    Hypertension Sister     Social History   Socioeconomic History   Marital status: Married    Spouse name: Not on file   Number of children: Not on file   Years of education: Not on file   Highest  education level: Not on file  Occupational History   Occupation: Teacher Assitant  Tobacco Use   Smoking status: Never   Smokeless tobacco: Never  Vaping Use   Vaping Use: Never used  Substance and Sexual Activity   Alcohol use: No    Alcohol/week: 0.0 standard drinks   Drug use: No   Sexual activity: Yes    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     aspirin-acetaminophen-caffeine (EXCEDRIN MIGRAINE) 250-250-65 MG tablet Take 2 tablets by mouth 2 (two) times daily as needed for headache.     Biotin 5000 MCG TABS Take 5,000 mcg by mouth daily.     losartan-hydrochlorothiazide (HYZAAR) 100-12.5 MG tablet TAKE 1 TABLET BY MOUTH DAILY 90 tablet 0   Multiple Vitamin (MULTIVITAMIN) tablet Take 1 tablet by mouth daily.     valACYclovir (VALTREX) 1000 MG tablet Take 2 g by mouth 2 (two) times daily as needed. Cold sores.  6   No facility-administered medications prior to visit.    No Known Allergies  Review of Systems  Musculoskeletal:        (-) swelling in legs bilaterally improved since last visit  Neurological:  Positive for weakness (in right ankle).      Objective:    Physical Exam Constitutional:      General: She is not in acute distress.    Appearance: Normal appearance. She is not ill-appearing.  HENT:     Head: Normocephalic and atraumatic.     Right Ear: External ear normal.     Left Ear: External ear normal.  Eyes:     Extraocular Movements: Extraocular movements intact.     Pupils: Pupils are equal, round, and reactive to light.  Cardiovascular:     Rate and Rhythm: Normal rate and regular rhythm.     Heart sounds: Normal heart sounds. No murmur heard.   No gallop.  Pulmonary:     Effort: Pulmonary effort is normal. No respiratory distress.     Breath sounds: Normal breath sounds. No wheezing or rales.  Musculoskeletal:        General: No swelling.  Skin:    General: Skin is warm and dry.  Neurological:     Mental Status: She is alert and oriented to person, place, and time.  Psychiatric:        Behavior: Behavior normal.        Judgment: Judgment normal.    BP 134/77 (BP Location: Right Arm, Patient Position: Sitting, Cuff Size: Small)   Pulse 86   Temp 98.2 F (36.8  C) (Oral)   Resp 16   Ht 5\' 2"  (1.575 m)   Wt 218 lb (98.9 kg)   SpO2 100%   BMI 39.87 kg/m  Wt Readings from Last 3 Encounters:  02/06/21 218 lb (98.9 kg)  01/04/21 214 lb (97.1 kg)  11/21/20 213 lb (96.6 kg)       Assessment &  Plan:   Problem List Items Addressed This Visit       Unprioritized   Prediabetes    Discussed hyperglycemia, diet/exercise/weight loss to combat progression to overt DM2.       Fatty liver    Noted on [place]. Discussed importance of low fat diet, exercise and weight loss.       Essential hypertension    BP Readings from Last 3 Encounters:  02/06/21 134/77  01/04/21 138/70  11/21/20 118/69  BP stable, continue hyzaar 100/12.5.       Daytime somnolence    Unchanged. Will re-initiate referral to sleep specialist for home sleep study.       Relevant Orders   Ambulatory referral to Pulmonology   Ankle weakness   Relevant Orders   Ambulatory referral to Sports Medicine   Other Visit Diagnoses     Morbid obesity (HCC)    -  Primary   Relevant Orders   Amb Ref to Medical Weight Management      No orders of the defined types were placed in this encounter.   I, [name], NP, personally preformed the services described in this documentation.  All medical record entries made by the scribe were at my direction and in my presence.  I have reviewed the chart and discharge instructions (if applicable) and agree that the record reflects my personal performance and is accurate and complete. 02/06/2021  I,Lyric Barr-McArthur,acting as a Neurosurgeon for Lemont Fillers, NP.,have documented all relevant documentation on the behalf of Lemont Fillers, NP,as directed by  Lemont Fillers, NP while in the presence of Lemont Fillers, NP.  Lemont Fillers, NP

## 2021-02-06 NOTE — Patient Instructions (Addendum)
Continue working on a healthy diet, exercise and weight loss.

## 2021-02-08 ENCOUNTER — Encounter: Payer: Self-pay | Admitting: Family Medicine

## 2021-02-08 ENCOUNTER — Other Ambulatory Visit: Payer: Self-pay

## 2021-02-08 ENCOUNTER — Ambulatory Visit (INDEPENDENT_AMBULATORY_CARE_PROVIDER_SITE_OTHER): Payer: BC Managed Care – PPO | Admitting: Family Medicine

## 2021-02-08 ENCOUNTER — Ambulatory Visit (HOSPITAL_BASED_OUTPATIENT_CLINIC_OR_DEPARTMENT_OTHER)
Admission: RE | Admit: 2021-02-08 | Discharge: 2021-02-08 | Disposition: A | Discharge status: Home / Self Care | Payer: BC Managed Care – PPO | Source: Ambulatory Visit | Attending: Family Medicine | Admitting: Family Medicine

## 2021-02-08 VITALS — BP 104/80 | Ht 62.0 in | Wt 218.0 lb

## 2021-02-08 DIAGNOSIS — S93409A Sprain of unspecified ligament of unspecified ankle, initial encounter: Secondary | ICD-10-CM | POA: Insufficient documentation

## 2021-02-08 DIAGNOSIS — M25371 Other instability, right ankle: Secondary | ICD-10-CM | POA: Insufficient documentation

## 2021-02-08 DIAGNOSIS — M19071 Primary osteoarthritis, right ankle and foot: Secondary | ICD-10-CM | POA: Diagnosis not present

## 2021-02-08 HISTORY — DX: Sprain of unspecified ligament of unspecified ankle, initial encounter: S93.409A

## 2021-02-08 NOTE — Assessment & Plan Note (Signed)
Appears to have a chronically torn ATFL with repeated inversion injuries.  She did initially have a significant injury that initiated this sequence of events. -Counseled on home exercise therapy and supportive care. -Aircast. -X-ray. -Could consider physical therapy or further imaging.

## 2021-02-08 NOTE — Progress Notes (Signed)
Tammy Brennan - 45 y.o. female MRN [id number]  Date of birth: Apr 10, 1975  SUBJECTIVE:  Including CC & ROS.  No chief complaint on file.   Tammy Brennan is a 45 y.o. female that is presenting with right ankle pain.  This is acute on chronic in nature.  It is occurring over the lateral aspect.  She has repeated ankle inversions.  This all stems from an initial injury from years ago.  She has tried a lace up ankle brace which offer some improvement.   Review of Systems See HPI   HISTORY: Past Medical, Surgical, Social, and Family History Reviewed & Updated per EMR.   Pertinent Historical Findings include:  Past Medical History:  Diagnosis Date   Carpal tunnel syndrome 06/20/2014   Depression 07/27/2019   Dysrhythmia    PAPLITATIONS   Essential hypertension 06/10/2017   Headache    HX  MIGRAINES   Hyperlipidemia, mixed 10/10/2014   Obesity 10/10/2014   PONV (postoperative nausea and vomiting)    Preventative health care 09/27/2014   Sees Dr Arrie Aran for GYN   Wart 10/10/2014    Past Surgical History:  Procedure Laterality Date   ABDOMINAL EXPOSURE N/A 07/14/2017   Procedure: ABDOMINAL EXPOSURE;  Surgeon: Larina Earthly, MD;  Location: [facility] OR;  Service: Vascular;  Laterality: N/A;   ANTERIOR LUMBAR FUSION N/A 07/14/2017   Procedure: Anterior Lumbar Interbody Fusion Lumbar five-Sacral one;  Surgeon: Donalee Citrin, MD;  Location: [facility] OR;  Service: Neurosurgery;  Laterality: N/A;   CESAREAN SECTION     X2    Family History  Problem Relation Age of Onset   Hypertension Mother    Hyperlipidemia Mother    Hypertension Father    Cancer Father        lung, previous light smoker   Alcoholism Father    Alcohol abuse Maternal Grandfather    Heart disease Maternal Grandfather        MI   Dementia Paternal Grandfather    Hypertension Sister     Social History   Socioeconomic History   Marital status: Married    Spouse name: Not on file   Number of children: Not on file   Years  of education: Not on file   Highest education level: Not on file  Occupational History   Occupation: Teacher Assitant  Tobacco Use   Smoking status: Never   Smokeless tobacco: Never  Vaping Use   Vaping Use: Never used  Substance and Sexual Activity   Alcohol use: No    Alcohol/week: 0.0 standard drinks   Drug use: No   Sexual activity: Yes    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, no dietary restrictions, 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[employer]  Other Topics Concern   Not on file  Social History Narrative   Not on file   Social Determinants of Health   Financial Resource Strain: Not on file  Food Insecurity: Not on file  Transportation Needs: Not on file  Physical Activity: Not on file  Stress: Not on file  Social Connections: Not on file  Intimate Partner Violence: Not on file     PHYSICAL EXAM:  VS: BP 104/80 (BP Location: Left Arm, Patient Position: Sitting)   Ht 5\' 2"  (1.575 m)   Wt 218 lb (98.9 kg)   BMI 39.87 kg/m  Physical Exam Gen: NAD, alert, cooperative with exam, well-appearing     ASSESSMENT & PLAN:   Ankle instability, right Appears to have a chronically torn ATFL  with repeated inversion injuries.  She did initially have a significant injury that initiated this sequence of events. -Counseled on home exercise therapy and supportive care. -Aircast. -X-ray. -Could consider physical therapy or further imaging.

## 2021-02-08 NOTE — Patient Instructions (Signed)
Nice to meet you Please try ice as needed Please try the exercises  I will call with the results.   Please send me a message in MyChart with any questions or updates.  Please see me back in 6 weeks.   --Dr. Jordan Likes

## 2021-02-12 ENCOUNTER — Telehealth: Payer: Self-pay | Admitting: Family Medicine

## 2021-02-12 NOTE — Telephone Encounter (Signed)
Left VM for patient. If she calls back please have her speak with a nurse/CMA and inform that she minor degenerative changes around the joint.   If any questions then please take the best time and phone number to call and I will try to call her back.   Myra Rude, MD [facility] 02/12/2021, 8:45 AM

## 2021-02-24 DIAGNOSIS — J209 Acute bronchitis, unspecified: Secondary | ICD-10-CM | POA: Diagnosis not present

## 2021-03-22 ENCOUNTER — Encounter: Payer: Self-pay | Admitting: Family Medicine

## 2021-03-22 ENCOUNTER — Ambulatory Visit (INDEPENDENT_AMBULATORY_CARE_PROVIDER_SITE_OTHER): Payer: BC Managed Care – PPO | Admitting: Family Medicine

## 2021-03-22 VITALS — BP 120/82 | Ht 62.0 in | Wt 218.0 lb

## 2021-03-22 DIAGNOSIS — M25371 Other instability, right ankle: Secondary | ICD-10-CM | POA: Diagnosis not present

## 2021-03-22 NOTE — Patient Instructions (Signed)
Good to see you Please continue using the brace Please use ice as needed  Please call [phone] to schedule the MRI   Please send me a message in MyChart with any questions or updates.  We'll schedule a virtual visit once the MRI is resulted.   --Dr. Jordan Likes

## 2021-03-22 NOTE — Progress Notes (Signed)
°  Tammy Brennan - 46 y.o. female MRN [id number]  Date of birth: [birth year]  SUBJECTIVE:  Including CC & ROS.  No chief complaint on file.   Tammy Brennan is a 47 y.o. female that is presenting with acute worsening of her right ankle pain.  She has had repeated inversion injuries over the years.  She has tried different forms of bracing as well as medications.  She has been through physical therapy as well as over 6 weeks of home physician exercise managed therapy.  Continues to have laxity with anterior drawer and with ambulation..  Independent review of the right ankle x-ray from 12/1 shows no acute changes.   Review of Systems See HPI   HISTORY: Past Medical, Surgical, Social, and Family History Reviewed & Updated per EMR.   Pertinent Historical Findings include:  Past Medical History:  Diagnosis Date   Carpal tunnel syndrome 06/20/2014   Depression 07/27/2019   Dysrhythmia    PAPLITATIONS   Essential hypertension 06/10/2017   Headache    HX  MIGRAINES   Hyperlipidemia, mixed 10/10/2014   Obesity 10/10/2014   PONV (postoperative nausea and vomiting)    Preventative health care 09/27/2014   Sees Dr Arrie Aran for GYN   Wart 10/10/2014    Past Surgical History:  Procedure Laterality Date   ABDOMINAL EXPOSURE N/A 07/14/2017   Procedure: ABDOMINAL EXPOSURE;  Surgeon: Larina Earthly, MD;  Location: [facility] OR;  Service: Vascular;  Laterality: N/A;   ANTERIOR LUMBAR FUSION N/A 07/14/2017   Procedure: Anterior Lumbar Interbody Fusion Lumbar five-Sacral one;  Surgeon: Donalee Citrin, MD;  Location: [facility] OR;  Service: Neurosurgery;  Laterality: N/A;   CESAREAN SECTION     X2     PHYSICAL EXAM:  VS: BP 120/82 (BP Location: Left Arm, Patient Position: Sitting)    Ht 5\' 2"  (1.575 m)    Wt 218 lb (98.9 kg)    BMI 39.87 kg/m  Physical Exam Gen: NAD, alert, cooperative with exam, well-appearing MSK:  Neurovascularly intact       ASSESSMENT & PLAN:   Ankle instability, right Acute on  chronic in nature.  Continues to have laxity with anterior drawer.  Has had over 6 weeks of physician directed home exercise therapy. -Counseled on home exercise therapy and supportive care. -Counseled on Aircast. -MRI of the right ankle to evaluate for chronically torn ATFL or any osteochondral defect.   

## 2021-03-22 NOTE — Assessment & Plan Note (Signed)
Acute on chronic in nature.  Continues to have laxity with anterior drawer.  Has had over 6 weeks of physician directed home exercise therapy. -Counseled on home exercise therapy and supportive care. -Counseled on Aircast. -MRI of the right ankle to evaluate for chronically torn ATFL or any osteochondral defect.

## 2021-03-28 ENCOUNTER — Encounter: Payer: Self-pay | Admitting: Family Medicine

## 2021-04-06 ENCOUNTER — Ambulatory Visit
Admission: RE | Admit: 2021-04-06 | Discharge: 2021-04-06 | Disposition: A | Discharge status: Home / Self Care | Payer: BC Managed Care – PPO | Source: Ambulatory Visit | Attending: Family Medicine | Admitting: Family Medicine

## 2021-04-06 ENCOUNTER — Other Ambulatory Visit: Payer: Self-pay

## 2021-04-06 DIAGNOSIS — M25471 Effusion, right ankle: Secondary | ICD-10-CM | POA: Diagnosis not present

## 2021-04-06 DIAGNOSIS — M65871 Other synovitis and tenosynovitis, right ankle and foot: Secondary | ICD-10-CM | POA: Diagnosis not present

## 2021-04-06 DIAGNOSIS — S93491A Sprain of other ligament of right ankle, initial encounter: Secondary | ICD-10-CM | POA: Diagnosis not present

## 2021-04-06 DIAGNOSIS — M25371 Other instability, right ankle: Secondary | ICD-10-CM

## 2021-04-06 DIAGNOSIS — M7731 Calcaneal spur, right foot: Secondary | ICD-10-CM | POA: Diagnosis not present

## 2021-04-07 ENCOUNTER — Other Ambulatory Visit: Payer: Self-pay | Admitting: Family Medicine

## 2021-04-07 DIAGNOSIS — B9689 Other specified bacterial agents as the cause of diseases classified elsewhere: Secondary | ICD-10-CM

## 2021-04-07 DIAGNOSIS — J208 Acute bronchitis due to other specified organisms: Secondary | ICD-10-CM

## 2021-04-09 ENCOUNTER — Other Ambulatory Visit: Payer: Self-pay

## 2021-04-09 ENCOUNTER — Ambulatory Visit (HOSPITAL_BASED_OUTPATIENT_CLINIC_OR_DEPARTMENT_OTHER)
Admission: RE | Admit: 2021-04-09 | Discharge: 2021-04-09 | Disposition: A | Discharge status: Home / Self Care | Payer: BC Managed Care – PPO | Source: Ambulatory Visit | Attending: Internal Medicine | Admitting: Internal Medicine

## 2021-04-09 ENCOUNTER — Ambulatory Visit (INDEPENDENT_AMBULATORY_CARE_PROVIDER_SITE_OTHER): Payer: BC Managed Care – PPO | Admitting: Internal Medicine

## 2021-04-09 ENCOUNTER — Encounter: Payer: Self-pay | Admitting: Internal Medicine

## 2021-04-09 VITALS — BP 116/80 | HR 93 | Temp 98.2°F | Resp 16 | Ht 62.0 in | Wt 223.0 lb

## 2021-04-09 DIAGNOSIS — R059 Cough, unspecified: Secondary | ICD-10-CM | POA: Diagnosis not present

## 2021-04-09 DIAGNOSIS — J9801 Acute bronchospasm: Secondary | ICD-10-CM

## 2021-04-09 DIAGNOSIS — R051 Acute cough: Secondary | ICD-10-CM

## 2021-04-09 MED ORDER — AZITHROMYCIN 250 MG PO TABS: ORAL_TABLET | ORAL | 0 refills | Status: DC

## 2021-04-09 MED ORDER — ALBUTEROL SULFATE HFA 108 (90 BASE) MCG/ACT IN AERS: 2.0000 | INHALATION_SPRAY | Freq: Four times a day (QID) | RESPIRATORY_TRACT | 0 refills | Status: DC | PRN

## 2021-04-09 MED ORDER — HYDROCODONE BIT-HOMATROP MBR 5-1.5 MG/5ML PO SOLN: 5.0000 mL | Freq: Every evening | ORAL | 0 refills | Status: DC | PRN

## 2021-04-09 NOTE — Progress Notes (Signed)
Subjective:    Patient ID: Tammy Brennan, female    DOB: 1975/10/04, 46 y.o.   MRN: [id number]  DOS:  04/09/2021 Type of visit - description: Acute  Symptoms a started 4 days ago: Cough, some wheezing, no sputum production. Had similar episode in November, at that time she also was wheezing, was seen at a urgent care.  Denies fever chills Some runny nose, no sore throat. No nausea or vomiting.  No myalgias Mild headache.  Review of Systems See above   Past Medical History:  Diagnosis Date   Carpal tunnel syndrome 06/20/2014   Depression 07/27/2019   Dysrhythmia    PAPLITATIONS   Essential hypertension 06/10/2017   Headache    HX  MIGRAINES   Hyperlipidemia, mixed 10/10/2014   Obesity 10/10/2014   PONV (postoperative nausea and vomiting)    Preventative health care 09/27/2014   Sees Dr Arrie Aran for GYN   Wart 10/10/2014    Past Surgical History:  Procedure Laterality Date   ABDOMINAL EXPOSURE N/A 07/14/2017   Procedure: ABDOMINAL EXPOSURE;  Surgeon: Larina Earthly, MD;  Location: [facility] OR;  Service: Vascular;  Laterality: N/A;   ANTERIOR LUMBAR FUSION N/A 07/14/2017   Procedure: Anterior Lumbar Interbody Fusion Lumbar five-Sacral one;  Surgeon: Donalee Citrin, MD;  Location: [facility] OR;  Service: Neurosurgery;  Laterality: N/A;   BTL     2012   CESAREAN SECTION     X2    Current Outpatient Medications  Medication Instructions   albuterol (VENTOLIN HFA) 108 (90 Base) MCG/ACT inhaler 2 puffs, Inhalation, Every 6 hours PRN   aspirin 81 MG chewable tablet Oral, Daily   aspirin-acetaminophen-caffeine (EXCEDRIN MIGRAINE) 250-250-65 MG tablet 2 tablets, Oral, 2 times daily PRN   azithromycin (ZITHROMAX Z-PAK) 250 MG tablet 2 tabs a day the first day, then 1 tab a day x 4 days   benzonatate (TESSALON) 200 mg, Oral, 3 times daily PRN   Biotin 5,000 mcg, Oral, Daily   HYDROcodone bit-homatropine (HYCODAN) 5-1.5 MG/5ML syrup 5 mLs, Oral, At bedtime PRN   losartan-hydrochlorothiazide  (HYZAAR) 100-12.5 MG tablet 1 tablet, Oral, Daily   methylPREDNISolone (MEDROL DOSEPAK) 4 MG TBPK tablet See admin instructions, follow package directions   Multiple Vitamin (MULTIVITAMIN) tablet 1 tablet, Oral, Daily   valACYclovir (VALTREX) 2 g, Oral, 2 times daily PRN, Cold sores.       Objective:   Physical Exam BP 116/80 (BP Location: Left Arm, Patient Position: Sitting, Cuff Size: Small)    Pulse 93    Temp 98.2 F (36.8 C) (Oral)    Resp 16    Ht 5\' 2"  (1.575 m)    Wt 223 lb (101.2 kg)    SpO2 97%    BMI 40.79 kg/m  General:   Well developed, NAD, BMI noted. HEENT:  Normocephalic . Face symmetric, atraumatic. TMs normal. Throat: Symmetric, not red. Lungs:  Prolonged expiratory time, no actual wheezing. Few rhonchi. Question of few crackles at the left base Normal respiratory effort, no intercostal retractions, no accessory muscle use. Heart: RRR,  no murmur.  Lower extremities: no pretibial edema bilaterally  Skin: Not pale. Not jaundice Neurologic:  alert & oriented X3.  Speech normal, gait appropriate for age and unassisted Psych--  Cognition and judgment appear intact.  Cooperative with normal attention span and concentration.  Behavior appropriate. No anxious or depressed appearing.      Assessment     46 year old female, PMH includes obesity, hyperlipidemia, hypertension, presents with:  Cough and  bronchospasm: The patient has no history of asthma or tobacco abuse. Has developed respiratory symptoms, a Haematologist" called for her Medrol pack over the phone 2 days ago but  she is no better. This is the second time she has bronchospasm with respiratory symptoms.  Reactive airway disease likely. Plan: Finish Medrol pack Chest x-ray, r/o PNM, see physical exam Cough control with Mucinex and Tessalon Perles.  Request something for nighttime, sent hydrocodone, small risk of addiction discussed. Zithromax Albuterol See AVS   This visit occurred during  the SARS-CoV-2 public health emergency.  Safety protocols were in place, including screening questions prior to the visit, additional usage of staff PPE, and extensive cleaning of exam room while observing appropriate contact time as indicated for disinfecting solutions.

## 2021-04-09 NOTE — Patient Instructions (Signed)
Proceed with a chest x-ray downstairs  Continue Medrol pack  Start antibiotics Zithromax  Albuterol: 2 puffs every 6 hours as needed for wheezing or persistent cough  For cough: - Mucinex DM over-the-counter as needed - Tessalon Perles as needed - If you have persistent cough, use the inhaler. -For cough at night, take 1 teaspoon of hydrocodone, cough suppressant, will cause drowsiness.  Drinking the fluids, use some Tylenol, call if not gradually better.

## 2021-04-12 ENCOUNTER — Encounter: Payer: Self-pay | Admitting: Family Medicine

## 2021-04-12 ENCOUNTER — Telehealth (INDEPENDENT_AMBULATORY_CARE_PROVIDER_SITE_OTHER): Payer: BC Managed Care – PPO | Admitting: Family Medicine

## 2021-04-12 VITALS — Ht 62.0 in | Wt 223.0 lb

## 2021-04-12 DIAGNOSIS — S93409A Sprain of unspecified ligament of unspecified ankle, initial encounter: Secondary | ICD-10-CM

## 2021-04-12 NOTE — Assessment & Plan Note (Signed)
Acute on chronic in nature.  MRI was demonstrating a chronic tear. -Counseled on home exercise therapy and supportive care. -Counseled on lace up brace. -Referral to orthopedics.

## 2021-04-12 NOTE — Progress Notes (Signed)
Virtual Visit via Video Note  I connected with Tammy Brennan on 04/12/21 at  1:10 PM EST by a video enabled telemedicine application and verified that I am speaking with the correct person using two identifiers.  Location: Patient: home Provider: office   I discussed the limitations of evaluation and management by telemedicine and the availability of in person appointments. The patient expressed understanding and agreed to proceed.  History of Present Illness:  Tammy Brennan is a 46 year old female is following up after an MRI of her right ankle.  This was demonstrating a small OCD lesion and a chronically torn ATFL.  Observations/Objective:   Assessment and Plan:  Chronic torn ATFL of right ankle:  Acute on chronic in nature.  MRI was demonstrating a chronic tear. -Counseled on home exercise therapy and supportive care. -Counseled on lace up brace. -Referral to orthopedics.  Follow Up Instructions:    I discussed the assessment and treatment plan with the patient. The patient was provided an opportunity to ask questions and all were answered. The patient agreed with the plan and demonstrated an understanding of the instructions.   The patient was advised to call back or seek an in-person evaluation if the symptoms worsen or if the condition fails to improve as anticipated.   Tammy Gandy, MD

## 2021-04-20 ENCOUNTER — Encounter: Payer: Self-pay | Admitting: Family Medicine

## 2021-04-20 DIAGNOSIS — M25371 Other instability, right ankle: Secondary | ICD-10-CM | POA: Diagnosis not present

## 2021-04-26 DIAGNOSIS — M6281 Muscle weakness (generalized): Secondary | ICD-10-CM | POA: Diagnosis not present

## 2021-04-26 DIAGNOSIS — M25371 Other instability, right ankle: Secondary | ICD-10-CM | POA: Diagnosis not present

## 2021-05-02 ENCOUNTER — Other Ambulatory Visit: Payer: Self-pay | Admitting: Family Medicine

## 2021-07-04 DIAGNOSIS — N950 Postmenopausal bleeding: Secondary | ICD-10-CM | POA: Diagnosis not present

## 2021-08-10 ENCOUNTER — Other Ambulatory Visit: Payer: Self-pay

## 2021-08-10 DIAGNOSIS — Z1231 Encounter for screening mammogram for malignant neoplasm of breast: Secondary | ICD-10-CM | POA: Diagnosis not present

## 2021-08-10 DIAGNOSIS — Z01419 Encounter for gynecological examination (general) (routine) without abnormal findings: Secondary | ICD-10-CM | POA: Diagnosis not present

## 2021-08-10 DIAGNOSIS — Z6839 Body mass index (BMI) 39.0-39.9, adult: Secondary | ICD-10-CM | POA: Diagnosis not present

## 2021-08-10 LAB — HM MAMMOGRAPHY

## 2021-08-10 MED ORDER — LOSARTAN POTASSIUM-HCTZ 100-12.5 MG PO TABS: 1.0000 | ORAL_TABLET | Freq: Every day | ORAL | 0 refills | Status: DC

## 2021-10-17 ENCOUNTER — Encounter (INDEPENDENT_AMBULATORY_CARE_PROVIDER_SITE_OTHER): Payer: Self-pay

## 2021-11-05 ENCOUNTER — Other Ambulatory Visit: Payer: Self-pay

## 2021-11-05 MED ORDER — LOSARTAN POTASSIUM-HCTZ 100-12.5 MG PO TABS: 1.0000 | ORAL_TABLET | Freq: Every day | ORAL | 0 refills | Status: DC

## 2022-01-04 DIAGNOSIS — Z23 Encounter for immunization: Secondary | ICD-10-CM | POA: Diagnosis not present

## 2022-01-27 DIAGNOSIS — R300 Dysuria: Secondary | ICD-10-CM | POA: Diagnosis not present

## 2022-01-27 DIAGNOSIS — R319 Hematuria, unspecified: Secondary | ICD-10-CM | POA: Diagnosis not present

## 2022-02-01 ENCOUNTER — Other Ambulatory Visit: Payer: Self-pay | Admitting: Family Medicine

## 2022-04-22 DIAGNOSIS — B009 Herpesviral infection, unspecified: Secondary | ICD-10-CM | POA: Diagnosis not present

## 2022-05-02 ENCOUNTER — Other Ambulatory Visit: Payer: Self-pay | Admitting: Family Medicine

## 2022-05-30 ENCOUNTER — Encounter: Payer: Self-pay | Admitting: Urgent Care

## 2022-05-30 ENCOUNTER — Ambulatory Visit (INDEPENDENT_AMBULATORY_CARE_PROVIDER_SITE_OTHER): Payer: BC Managed Care – PPO | Admitting: Urgent Care

## 2022-05-30 VITALS — BP 113/72 | HR 76 | Resp 18 | Ht 62.0 in | Wt 225.0 lb

## 2022-05-30 DIAGNOSIS — I10 Essential (primary) hypertension: Secondary | ICD-10-CM | POA: Diagnosis not present

## 2022-05-30 DIAGNOSIS — R011 Cardiac murmur, unspecified: Secondary | ICD-10-CM | POA: Diagnosis not present

## 2022-05-30 DIAGNOSIS — M7989 Other specified soft tissue disorders: Secondary | ICD-10-CM

## 2022-05-30 DIAGNOSIS — Z6841 Body Mass Index (BMI) 40.0 and over, adult: Secondary | ICD-10-CM

## 2022-05-30 HISTORY — DX: Cardiac murmur, unspecified: R01.1

## 2022-05-30 HISTORY — DX: Other specified soft tissue disorders: M79.89

## 2022-05-30 HISTORY — DX: Body Mass Index (BMI) 40.0 and over, adult: Z684

## 2022-05-30 LAB — CBC WITH DIFFERENTIAL/PLATELET
Basophils Absolute: 0 10*3/uL (ref 0.0–0.1)
Basophils Relative: 0.4 % (ref 0.0–3.0)
Eosinophils Absolute: 0.4 10*3/uL (ref 0.0–0.7)
Eosinophils Relative: 5.1 % — ABNORMAL HIGH (ref 0.0–5.0)
HCT: 37.9 % (ref 36.0–46.0)
Hemoglobin: 12.9 g/dL (ref 12.0–15.0)
Lymphocytes Relative: 27.2 % (ref 12.0–46.0)
Lymphs Abs: 2.4 10*3/uL (ref 0.7–4.0)
MCHC: 34 g/dL (ref 30.0–36.0)
MCV: 90.3 fl (ref 78.0–100.0)
Monocytes Absolute: 0.6 10*3/uL (ref 0.1–1.0)
Monocytes Relative: 6.7 % (ref 3.0–12.0)
Neutro Abs: 5.3 10*3/uL (ref 1.4–7.7)
Neutrophils Relative %: 60.6 % (ref 43.0–77.0)
Platelets: 401 10*3/uL — ABNORMAL HIGH (ref 150.0–400.0)
RBC: 4.2 Mil/uL (ref 3.87–5.11)
RDW: 13.1 % (ref 11.5–15.5)
WBC: 8.7 10*3/uL (ref 4.0–10.5)

## 2022-05-30 LAB — COMPREHENSIVE METABOLIC PANEL
ALT: 24 U/L (ref 0–35)
AST: 18 U/L (ref 0–37)
Albumin: 4.2 g/dL (ref 3.5–5.2)
Alkaline Phosphatase: 121 U/L — ABNORMAL HIGH (ref 39–117)
BUN: 16 mg/dL (ref 6–23)
CO2: 31 mEq/L (ref 19–32)
Calcium: 9.4 mg/dL (ref 8.4–10.5)
Chloride: 100 mEq/L (ref 96–112)
Creatinine, Ser: 0.67 mg/dL (ref 0.40–1.20)
GFR: 103.33 mL/min (ref >60.00)
Glucose, Bld: 98 mg/dL (ref 70–99)
Potassium: 4.3 mEq/L (ref 3.5–5.1)
Sodium: 139 mEq/L (ref 135–145)
Total Bilirubin: 0.4 mg/dL (ref 0.2–1.2)
Total Protein: 6.8 g/dL (ref 6.0–8.3)

## 2022-05-30 MED ORDER — ZEPBOUND 2.5 MG/0.5ML ~~LOC~~ SOAJ: 2.5000 mg | SUBCUTANEOUS | 2 refills | Status: DC

## 2022-05-30 MED ORDER — FUROSEMIDE 20 MG PO TABS: 20.0000 mg | ORAL_TABLET | Freq: Every day | ORAL | 0 refills | Status: DC

## 2022-05-30 NOTE — Assessment & Plan Note (Addendum)
Well-controlled on losartan/HCTZ.  Continue. ?

## 2022-05-30 NOTE — Assessment & Plan Note (Signed)
Previously on saxenda, now on backorder. Pt lost 30+ pounds, tolerated well. Will try Zepbound. Prior Auth needed

## 2022-05-30 NOTE — Assessment & Plan Note (Signed)
Referral placed to cardiology for suspected ECHO needed. New onset murmur heard in the pulmonic region. New onset hand swelling. Hx of CXR showing borderline cardiomegaly.

## 2022-05-30 NOTE — Assessment & Plan Note (Signed)
Lungs CTA on exam today. Labs ordered, PRN lasix. Pt encouraged to eat a banana the days she requires a lasix and to monitor her BP to ensure it does not decrease. Side effect of increased urination discussed.

## 2022-05-30 NOTE — Progress Notes (Deleted)
Acute Office Visit  Subjective:     Patient ID: Tammy STEPANIAK, female    DOB: Sep 24, 1975, 47 y.o.   MRN: [id number]  Chief Complaint  Patient presents with   Edema    Swelling in fingers  Onset one week  Hilliard Clark has been on back order since November would like to discuss other options     HPI Patient is in today for concern of swelling in her fingers for the past one week. She denies any change in diet. No recent travel or prolonged sedentary lifestyle. She denies SOB, DOE, edema, CP, cough. She denies blueness of the fingertips, pain, cold extremities, or radicular symptoms. She does have long-standing hx of palpitations. She was seen for similar sx in 2022 in which it was attributed at that time to a recent car ride and eating too much salt. Had labs performed, showing high D-dimer which ultimately resulted in a CTA chest, negative for PE. Pt has had three CXRs performed since Sept 2022, two showing interstitial edema, one showing borderline cardiomegaly. Had cardiology workup in 2022. Additionally, pt is on losartan-hctz 100-12.5mg  daily for HTN. Took lasix in the past with good response. Pt also wanting to have a replacement for her Saxenda. Was started by her gynecologist last year on Saxenda, was on it for 4-6 months and lost 30+ pounds. It has been out of stock however and pt wanting to obtain an alternative. Tolerated injections well without adverse reactions.  ROS As Per HPI     Objective:    BP 113/72 (BP Location: Right Arm, Patient Position: Sitting, Cuff Size: Large)   Pulse 76   Resp 18   Ht 5\' 2"  (1.575 m)   Wt 225 lb (102.1 kg)   SpO2 100%   BMI 41.15 kg/m    BP Readings from Last 3 Encounters:  05/30/22 113/72  04/09/21 116/80  03/22/21 120/82    Wt Readings from Last 3 Encounters:  05/30/22 225 lb (102.1 kg)  04/12/21 223 lb (101.2 kg)  04/09/21 223 lb (101.2 kg)       Physical Exam Vitals and nursing note reviewed.  Constitutional:       General: She is not in acute distress.    Appearance: Normal appearance. She is obese. She is not ill-appearing.  HENT:     Head: Normocephalic and atraumatic.  Cardiovascular:     Rate and Rhythm: Normal rate and regular rhythm.     Pulses: Normal pulses.     Heart sounds: Murmur (Grade 1/6 L #2 ICS murmur) heard.     No friction rub. No gallop.  Pulmonary:     Effort: Pulmonary effort is normal. No respiratory distress.     Breath sounds: Normal breath sounds. No stridor. No wheezing, rhonchi or rales.  Chest:     Chest wall: No tenderness.  Musculoskeletal:        General: Swelling (B hand swelling) present. Normal range of motion.     Cervical back: Normal range of motion.     Right lower leg: No edema.     Left lower leg: No edema.  Skin:    General: Skin is warm and dry.  Neurological:     General: No focal deficit present.     Mental Status: She is alert and oriented to person, place, and time.     No results found for any visits on 05/30/22.       Assessment & Plan:   Problem List Items Addressed  This Visit       Cardiovascular and Mediastinum   Essential hypertension    Well controlled on losartan-HCTZ. Continue      Relevant Medications   furosemide (LASIX) 20 MG tablet     Other   BMI 40.0-44.9, adult (HCC)    Previously on saxenda, now on backorder. Pt lost 30+ pounds, tolerated well. Will try Zepbound. Prior Auth needed      Relevant Medications   tirzepatide (ZEPBOUND) 2.5 MG/0.5ML Pen   Bilateral hand swelling - Primary    Lungs CTA on exam today. Labs ordered, PRN lasix. Pt encouraged to eat a banana the days she requires a lasix and to monitor her BP to ensure it does not decrease. Side effect of increased urination discussed.      Relevant Orders   Ambulatory referral to Cardiology   Comp Met (CMET)   CBC w/Diff   TSH Rfx on Abnormal to Free T4   Murmur, cardiac    Referral placed to cardiology for suspected ECHO needed. New onset murmur  heard in the pulmonic region. New onset hand swelling. Hx of CXR showing borderline cardiomegaly.       Relevant Orders   Ambulatory referral to Cardiology   ECHOCARDIOGRAM COMPLETE    Meds ordered this encounter  Medications   tirzepatide (ZEPBOUND) 2.5 MG/0.5ML Pen    Sig: Inject 2.5 mg into the skin once a week.    Dispense:  2 mL    Refill:  2    Order Specific Question:   Supervising Provider    Answer:   Penni Homans A [[id number]]   furosemide (LASIX) 20 MG tablet    Sig: Take 1 tablet (20 mg total) by mouth daily. As needed for swelling    Dispense:  30 tablet    Refill:  0    Order Specific Question:   Supervising Provider    Answer:   Penni Homans A [[id number]]    Return in about 4 weeks (around 06/27/2022) for annual physical.  Jeannette Maddy L Kayra Crowell, PA

## 2022-05-31 ENCOUNTER — Encounter: Payer: Self-pay | Admitting: Family Medicine

## 2022-05-31 LAB — TSH RFX ON ABNORMAL TO FREE T4: TSH: 2.54 u[IU]/mL (ref 0.450–4.500)

## 2022-06-12 ENCOUNTER — Encounter: Payer: Self-pay | Admitting: Family Medicine

## 2022-06-13 ENCOUNTER — Telehealth: Payer: Self-pay

## 2022-06-13 NOTE — Telephone Encounter (Signed)
done

## 2022-06-17 NOTE — Assessment & Plan Note (Deleted)
Encouraged heart healthy diet, increase exercise, avoid trans fats, consider a krill oil cap daily 

## 2022-06-17 NOTE — Assessment & Plan Note (Deleted)
Patient encouraged to maintain heart healthy diet, regular exercise, adequate sleep. Consider daily probiotics. Take medications as prescribed. Follows with GYN for paps, MGM is up to date. colonoscopy

## 2022-06-18 ENCOUNTER — Telehealth: Payer: Self-pay | Admitting: *Deleted

## 2022-06-18 ENCOUNTER — Encounter: Payer: BC Managed Care – PPO | Admitting: Family Medicine

## 2022-06-18 DIAGNOSIS — E782 Mixed hyperlipidemia: Secondary | ICD-10-CM

## 2022-06-18 DIAGNOSIS — Z0000 Encounter for general adult medical examination without abnormal findings: Secondary | ICD-10-CM

## 2022-06-18 NOTE — Telephone Encounter (Signed)
PA has already submitted and is in process for this patient and drug.;[id number];XIDHWY:In Process;

## 2022-06-18 NOTE — Progress Notes (Shared)
Subjective:   By signing my name below, I, [name], attest that this documentation has been prepared under the direction and in the presence of Bradd CanaryBlyth, Stacey A, MD., 06/18/2022.   Patient ID: Tammy Brennan, female    DOB: 08/23/1975, 47 y.o.   MRN: [id number]  No chief complaint on file.  HPI Patient is in today for a comprehensive physical exam and follow up on chronic medical concerns. She denies CP/palpitations/SOB/HA/fever/chills/GI or GU symptoms.  Past Medical History:  Diagnosis Date   Carpal tunnel syndrome 06/20/2014   Depression 07/27/2019   Dysrhythmia    PAPLITATIONS   Essential hypertension 06/10/2017   Headache    HX  MIGRAINES   Hyperlipidemia, mixed 10/10/2014   Murmur, cardiac 05/30/2022   Obesity 10/10/2014   PONV (postoperative nausea and vomiting)    Preventative health care 09/27/2014   Sees Dr Arrie Aranichard Tayvon for GYN   Wart 10/10/2014    Past Surgical History:  Procedure Laterality Date   ABDOMINAL EXPOSURE N/A 07/14/2017   Procedure: ABDOMINAL EXPOSURE;  Surgeon: Larina EarthlyEarly, Todd F, MD;  [facility];  Service: Vascular;  Laterality: N/A;   ANTERIOR LUMBAR FUSION N/A 07/14/2017   Procedure: Anterior Lumbar Interbody Fusion Lumbar five-Sacral one;  Surgeon: Donalee Citrinram, Gary, MD;  [facility];  Service: Neurosurgery;  Laterality: N/A;   BTL     2012   CESAREAN SECTION     X2    Family History  Problem Relation Age of Onset   Hypertension Mother    Hyperlipidemia Mother    Hypertension Father    Cancer Father        lung, previous light smoker   Alcoholism Father    Alcohol abuse Maternal Grandfather    Heart disease Maternal Grandfather        MI   Dementia Paternal Grandfather    Hypertension Sister     Social History   Socioeconomic History   Marital status: Married    Spouse name: Not on file   Number of children: Not on file   Years of education: Not on file   Highest education level: Not on file  Occupational History   Occupation:  Teacher Assitant  Tobacco Use   Smoking status: Never   Smokeless tobacco: Never  Vaping Use   Vaping Use: Never used  Substance and Sexual Activity   Alcohol use: No    Alcohol/week: 0.0 standard drinks of alcohol   Drug use: No   Sexual activity: Yes    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, no dietary restrictions, Hotel managerassitant teacher at [employer]  Other Topics Concern   Not on file  Social History Narrative   Not on file   Social Determinants of Health   Financial Resource Strain: Not on file  Food Insecurity: Not on file  Transportation Needs: Not on file  Physical Activity: Not on file  Stress: Not on file  Social Connections: Not on file  Intimate Partner Violence: Not on file    Outpatient Medications Prior to Visit  Medication Sig Dispense Refill   aspirin 81 MG chewable tablet Chew by mouth daily.     aspirin-acetaminophen-caffeine (EXCEDRIN MIGRAINE) 250-250-65 MG tablet Take 2 tablets by mouth 2 (two) times daily as needed for headache.     Biotin 5000 MCG TABS Take 5,000 mcg by mouth daily.     furosemide (LASIX) 20 MG tablet Take 1 tablet (20 mg total) by mouth daily. As needed for swelling 30 tablet 0  losartan-hydrochlorothiazide (HYZAAR) 100-12.5 MG tablet TAKE 1 TABLET BY MOUTH DAILY 90 tablet 0   Multiple Vitamin (MULTIVITAMIN) tablet Take 1 tablet by mouth daily.     tirzepatide (ZEPBOUND) 2.5 MG/0.5ML Pen Inject 2.5 mg into the skin once a week. 2 mL 2   TURMERIC PO Take by mouth.     valACYclovir (VALTREX) 1000 MG tablet Take 2 g by mouth 2 (two) times daily as needed. Cold sores.  6   VITAMIN D, CHOLECALCIFEROL, PO Take by mouth.     No facility-administered medications prior to visit.    No Known Allergies  Review of Systems  Constitutional:  Negative for chills and fever.  Respiratory:  Negative for shortness of breath.   Cardiovascular:  Negative for chest pain and palpitations.  Gastrointestinal:  Negative for abdominal pain, blood in stool,  constipation, diarrhea, nausea and vomiting.  Genitourinary:  Negative for dysuria, frequency, hematuria and urgency.  Skin:           Neurological:  Negative for headaches.       Objective:    Physical Exam Constitutional:      General: She is not in acute distress.    Appearance: Normal appearance. She is normal weight. She is not ill-appearing.  HENT:     Head: Normocephalic and atraumatic.     Right Ear: Tympanic membrane, ear canal and external ear normal.     Left Ear: Tympanic membrane, ear canal and external ear normal.     Nose: Nose normal.     Mouth/Throat:     Mouth: Mucous membranes are moist.     Pharynx: Oropharynx is clear.  Eyes:     General:        Right eye: No discharge.        Left eye: No discharge.     Extraocular Movements: Extraocular movements intact.     Right eye: No nystagmus.     Left eye: No nystagmus.     Pupils: Pupils are equal, round, and reactive to light.  Neck:     Vascular: No carotid bruit.  Cardiovascular:     Rate and Rhythm: Normal rate and regular rhythm.     Pulses: Normal pulses.     Heart sounds: Normal heart sounds. No murmur heard.    No gallop.  Pulmonary:     Effort: Pulmonary effort is normal. No respiratory distress.     Breath sounds: Normal breath sounds. No wheezing or rales.  Abdominal:     General: Bowel sounds are normal.     Palpations: Abdomen is soft.     Tenderness: There is no abdominal tenderness. There is no guarding.  Musculoskeletal:        General: Normal range of motion.     Cervical back: Normal range of motion.     Right lower leg: No edema.     Left lower leg: No edema.     Comments: Muscle strength 5/5 on upper and lower extremities.   Lymphadenopathy:     Cervical: No cervical adenopathy.  Skin:    General: Skin is warm and dry.  Neurological:     Mental Status: She is alert and oriented to person, place, and time.     Sensory: Sensation is intact.     Motor: Motor function is intact.      Coordination: Coordination is intact.     Deep Tendon Reflexes:     Reflex Scores:      Patellar reflexes are 2+  on the right side and 2+ on the left side. Psychiatric:        Mood and Affect: Mood normal.        Behavior: Behavior normal.        Judgment: Judgment normal.     There were no vitals taken for this visit. Wt Readings from Last 3 Encounters:  05/30/22 225 lb (102.1 kg)  04/12/21 223 lb (101.2 kg)  04/09/21 223 lb (101.2 kg)    Diabetic Foot Exam - Simple   No data filed    Lab Results  Component Value Date   WBC 8.7 05/30/2022   HGB 12.9 05/30/2022   HCT 37.9 05/30/2022   PLT 401.0 (H) 05/30/2022   GLUCOSE 98 05/30/2022   CHOL 192 05/16/2020   TRIG 146.0 05/16/2020   HDL 74.60 05/16/2020   LDLDIRECT 96.0 05/03/2019   LDLCALC 88 05/16/2020   ALT 24 05/30/2022   AST 18 05/30/2022   NA 139 05/30/2022   K 4.3 05/30/2022   CL 100 05/30/2022   CREATININE 0.67 05/30/2022   BUN 16 05/30/2022   CO2 31 05/30/2022   TSH 2.540 05/30/2022   HGBA1C 5.9 05/16/2020   MICROALBUR <0.7 06/15/2018    Lab Results  Component Value Date   TSH 2.540 05/30/2022   Lab Results  Component Value Date   WBC 8.7 05/30/2022   HGB 12.9 05/30/2022   HCT 37.9 05/30/2022   MCV 90.3 05/30/2022   PLT 401.0 (H) 05/30/2022   Lab Results  Component Value Date   NA 139 05/30/2022   K 4.3 05/30/2022   CO2 31 05/30/2022   GLUCOSE 98 05/30/2022   BUN 16 05/30/2022   CREATININE 0.67 05/30/2022   BILITOT 0.4 05/30/2022   ALKPHOS 121 (H) 05/30/2022   AST 18 05/30/2022   ALT 24 05/30/2022   PROT 6.8 05/30/2022   ALBUMIN 4.2 05/30/2022   CALCIUM 9.4 05/30/2022   ANIONGAP 11 07/07/2017   GFR 103.33 05/30/2022   Lab Results  Component Value Date   CHOL 192 05/16/2020   Lab Results  Component Value Date   HDL 74.60 05/16/2020   Lab Results  Component Value Date   LDLCALC 88 05/16/2020   Lab Results  Component Value Date   TRIG 146.0 05/16/2020   Lab Results   Component Value Date   CHOLHDL 3 05/16/2020   Lab Results  Component Value Date   HGBA1C 5.9 05/16/2020      Assessment & Plan:  Mammogram: Last completed on 09/27/2019 with no mammographic evidence of malignancy. Repeat mammography recommended in 1-2 years.  Pap Smear: There is no history of this.  Advanced Directives: Encouraged completion of advanced care planning documents.  Healthy Lifestyle: Encouraged 6-8 hours of sleep, heart healthy diet, 60-80 oz of non-alcohol/non-caffeinated fluids, and minimum of 4000 steps daily.  Immunizations: Immunization History  Administered Date(s) Administered   Influenza-Unspecified 12/10/2018, 12/09/2020   PFIZER(Purple Top)SARS-COV-2 Vaccination 05/08/2019, 05/29/2019   Problem List Items Addressed This Visit       Other   Preventative health care   Hyperlipidemia, mixed - Primary   No orders of the defined types were placed in this encounter.  I, Vickey Sages, personally preformed the services described in this documentation.  All medical record entries made by the scribe were at my direction and in my presence.  I have reviewed the chart and discharge instructions (if applicable) and agree that the record reflects my personal performance and is accurate and complete. 06/18/2022  I,Mohammed Iqbal,acting as a scribe for Danise Edge, MD.,have documented all relevant documentation on the behalf of Danise Edge, MD,as directed by  Danise Edge, MD while in the presence of Danise Edge, MD.  Vickey Sages

## 2022-06-18 NOTE — Telephone Encounter (Signed)
Opened in error

## 2022-06-18 NOTE — Telephone Encounter (Signed)
Prior auth started via cover my meds.  Awaiting determination.  Key: Pur[place] Nails

## 2022-06-19 ENCOUNTER — Other Ambulatory Visit (HOSPITAL_COMMUNITY): Payer: Self-pay

## 2022-06-19 NOTE — Telephone Encounter (Signed)
Placed a call to [facility] [phone]) to check the status of the prior authorization.   Status is pending and can take up to 72 hours.   Case id: [id number]

## 2022-06-20 NOTE — Telephone Encounter (Signed)
Pt called back to follow up on authorization status. Advised that per note yesterday, status is pending and it takes up to 72 hours. Adv'd pt to follow up with [place] on Monday if she has not heard back. Pt understands

## 2022-06-24 ENCOUNTER — Encounter: Payer: Self-pay | Admitting: *Deleted

## 2022-06-26 ENCOUNTER — Ambulatory Visit (HOSPITAL_BASED_OUTPATIENT_CLINIC_OR_DEPARTMENT_OTHER): Payer: BC Managed Care – PPO

## 2022-06-27 ENCOUNTER — Other Ambulatory Visit: Payer: Self-pay

## 2022-06-27 MED ORDER — ZEPBOUND 2.5 MG/0.5ML ~~LOC~~ SOAJ: 2.5000 mg | SUBCUTANEOUS | 2 refills | Status: DC

## 2022-06-27 NOTE — Addendum Note (Signed)
Addended by: Kathi Ludwig on: 06/27/2022 12:51 PM   Modules accepted: Orders

## 2022-06-27 NOTE — Telephone Encounter (Signed)
[employer] and they had to restart a new PA.    It was approved case # [id number] 05/28/22-02/22/23.  They will pay for 1 month at a time until pt reach 7.5mg , then they will pay for 90 day supply.

## 2022-06-28 NOTE — Progress Notes (Addendum)
Expand All Collapse All    Acute Office Visit   Subjective:      Subjective [] Expand by Default Patient ID: Tammy Brennan, female    DOB: 08/17/1975, 47 y.o.   MRN: [id number]       Chief Complaint  Patient presents with   Edema      Swelling in fingers  Onset one week  Tammy Brennan has been on back order since November would like to discuss other options       HPI Patient is in today for concern of swelling in her fingers for the past one week. She denies any change in diet. No recent travel or prolonged sedentary lifestyle. She denies SOB, DOE, edema, CP, cough. She denies blueness of the fingertips, pain, cold extremities, or radicular symptoms. She does have long-standing hx of palpitations. She was seen for similar sx in 2022 in which it was attributed at that time to a recent car ride and eating too much salt. Had labs performed, showing high D-dimer which ultimately resulted in a CTA chest, negative for PE. Pt has had three CXRs performed since Sept 2022, two showing interstitial edema, one showing borderline cardiomegaly. Had cardiology workup in 2022. Additionally, pt is on losartan-hctz 100-12.5mg  daily for HTN. Took lasix in the past with good response. Pt also wanting to have a replacement for her Saxenda. Was started by her gynecologist last year on Saxenda, was on it for 4-6 months and lost 30+ pounds. It has been out of stock however and pt wanting to obtain an alternative. Tolerated injections well without adverse reactions.   ROS As Per HPI       Objective:    Objective  BP 113/72 (BP Location: Right Arm, Patient Position: Sitting, Cuff Size: Large)   Pulse 76   Resp 18   Ht 5\' 2"  (1.575 m)   Wt 225 lb (102.1 kg)   SpO2 100%   BMI 41.15 kg/m         BP Readings from Last 3 Encounters:  05/30/22 113/72  04/09/21 116/80  03/22/21 120/82       Wt Readings from Last 3 Encounters:  05/30/22 225 lb (102.1 kg)  04/12/21 223 lb (101.2 kg)  04/09/21  223 lb (101.2 kg)        Physical Exam Vitals and nursing note reviewed.  Constitutional:      General: She is not in acute distress.    Appearance: Normal appearance. She is obese. She is not ill-appearing.  HENT:     Head: Normocephalic and atraumatic.  Cardiovascular:     Rate and Rhythm: Normal rate and regular rhythm.     Pulses: Normal pulses.     Heart sounds: Murmur (Grade 1/6 L #2 ICS murmur) heard.     No friction rub. No gallop.  Pulmonary:     Effort: Pulmonary effort is normal. No respiratory distress.     Breath sounds: Normal breath sounds. No stridor. No wheezing, rhonchi or rales.  Chest:     Chest wall: No tenderness.  Musculoskeletal:        General: Swelling (B hand swelling) present. Normal range of motion.     Cervical back: Normal range of motion.     Right lower leg: No edema.     Left lower leg: No edema.  Skin:    General: Skin is warm and dry.  Neurological:     General: No focal deficit present.     Mental  Status: She is alert and oriented to person, place, and time.        No results found for any visits on 05/30/22.           Assessment & Plan:    Problem List Items Addressed This Visit              Cardiovascular and Mediastinum    Essential hypertension      Well controlled on losartan-HCTZ. Continue        Relevant Medications    furosemide (LASIX) 20 MG tablet        Other    BMI 40.0-44.9, adult (HCC)      Previously on saxenda, now on backorder. Pt lost 30+ pounds, tolerated well. Will try Zepbound. Prior Auth needed        Relevant Medications    tirzepatide (ZEPBOUND) 2.5 MG/0.5ML Pen    Bilateral hand swelling - Primary      Lungs CTA on exam today. Labs ordered, PRN lasix. Pt encouraged to eat a banana the days she requires a lasix and to monitor her BP to ensure it does not decrease. Side effect of increased urination discussed.        Relevant Orders    Ambulatory referral to Cardiology    Comp Met (CMET)     CBC w/Diff    TSH Rfx on Abnormal to Free T4    Murmur, cardiac      Referral placed to cardiology for suspected ECHO needed. New onset murmur heard in the pulmonic region. New onset hand swelling. Hx of CXR showing borderline cardiomegaly.         Relevant Orders    Ambulatory referral to Cardiology    ECHOCARDIOGRAM COMPLETE           Meds ordered this encounter  Medications   tirzepatide (ZEPBOUND) 2.5 MG/0.5ML Pen      Sig: Inject 2.5 mg into the skin once a week.      Dispense:  2 mL      Refill:  2      Order Specific Question:   Supervising Provider      Answer:   Danise Edge A [[id number]]   furosemide (LASIX) 20 MG tablet      Sig: Take 1 tablet (20 mg total) by mouth daily. As needed for swelling      Dispense:  30 tablet      Refill:  0      Order Specific Question:   Supervising Provider      Answer:   Danise Edge A [[id number]]      Return in about 4 weeks (around 06/27/2022) for annual physical.   Dayanis Bergquist L Sanjit Mcmichael, PA

## 2022-06-28 NOTE — Addendum Note (Signed)
Addended by: Guy Sandifer on: 06/28/2022 12:02 PM   Modules accepted: Level of Service

## 2022-06-28 NOTE — Addendum Note (Signed)
Addended by: Guy Sandifer on: 06/28/2022 04:45 PM   Modules accepted: Level of Service

## 2022-06-28 NOTE — Telephone Encounter (Signed)
This will be sent to the Prior Auth team. Can you please check status and update patient?

## 2022-07-01 ENCOUNTER — Other Ambulatory Visit (HOSPITAL_COMMUNITY): Payer: Self-pay

## 2022-07-05 ENCOUNTER — Other Ambulatory Visit: Payer: Self-pay

## 2022-07-09 ENCOUNTER — Ambulatory Visit (INDEPENDENT_AMBULATORY_CARE_PROVIDER_SITE_OTHER): Payer: BC Managed Care – PPO | Admitting: Family

## 2022-07-09 ENCOUNTER — Encounter: Payer: Self-pay | Admitting: Family

## 2022-07-09 ENCOUNTER — Telehealth: Payer: Self-pay | Admitting: Family

## 2022-07-09 VITALS — BP 117/67 | HR 76 | Temp 98.0°F | Resp 16 | Ht 62.0 in | Wt 223.0 lb

## 2022-07-09 DIAGNOSIS — Z0000 Encounter for general adult medical examination without abnormal findings: Secondary | ICD-10-CM | POA: Diagnosis not present

## 2022-07-09 DIAGNOSIS — E781 Pure hyperglyceridemia: Secondary | ICD-10-CM

## 2022-07-09 DIAGNOSIS — R011 Cardiac murmur, unspecified: Secondary | ICD-10-CM | POA: Diagnosis not present

## 2022-07-09 DIAGNOSIS — R5383 Other fatigue: Secondary | ICD-10-CM | POA: Diagnosis not present

## 2022-07-09 DIAGNOSIS — M5416 Radiculopathy, lumbar region: Secondary | ICD-10-CM

## 2022-07-09 DIAGNOSIS — Z23 Encounter for immunization: Secondary | ICD-10-CM

## 2022-07-09 DIAGNOSIS — Z114 Encounter for screening for human immunodeficiency virus [HIV]: Secondary | ICD-10-CM

## 2022-07-09 DIAGNOSIS — Z1159 Encounter for screening for other viral diseases: Secondary | ICD-10-CM

## 2022-07-09 DIAGNOSIS — M722 Plantar fascial fibromatosis: Secondary | ICD-10-CM | POA: Insufficient documentation

## 2022-07-09 NOTE — Telephone Encounter (Signed)
Please call [facility] to request copy of pap and mammogram.

## 2022-07-09 NOTE — Telephone Encounter (Signed)
Electronic request sent 

## 2022-07-09 NOTE — Assessment & Plan Note (Addendum)
Reports pap/mammo up to date per GYN. Will request these records.  Labs as ordered.  It looks like her GYN ordered a cologuard last year. Will request these results from her GYN. Discussed healthy diet/exercise and weight loss. Tdap today. Encouraged her to get flu shot and covid boosters next fall.

## 2022-07-09 NOTE — Progress Notes (Signed)
Subjective:   By signing my name below, I, [name], attest that this documentation has been prepared under the direction and in the presence of [name], NP.  07/09/2022.   Patient ID: Tammy Brennan, female    DOB: Nov 28, 1975, 47 y.o.   MRN: [id number]  Chief Complaint  Patient presents with   Annual Exam    HPI Patient is in today for a comprehensive physical exam.  Fatigue:  Her main complaint today is constantly feeling fatigued, possibly related to being overweight. When she returns home in the afternoons she struggles with keeping her eyes open. At night she usually doesn't sleep well. She endorses snoring but has not yet had a sleep study.  Hot flashes: She complains of occasional hot flashes. She no longer has regular menstrual cycles.  Foot pain:  Upon moving her left foot in certain ways, she occasionally feels a brief burning sensation/pain on the top of her left foot. This usually lasts a few seconds. She also has some similar pains in her bilateral heels. She believes the pain may be related to her prior lumbar fusion surgery.  Heart murmur:  She reports that a heart murmur was auscultated at a clinic visit in March. Her father and sister also have heart murmurs. At the time she also had some swelling that has improved currently. She has not needed to use her prn Lasix recently.  Social history: No surgeries or procedures in the prior year. She denies any recent changes to her family medical history. No tobacco or vaping use. She works as a Geophysicist/field seismologist for first grade. She has a female partner. She is married with 2 children. She enjoys watching her children play baseball and going to the beach in her free time.  Pap Smear:  She states she is due for an update this Summer.  Last completed 06/22/2018.   Mammogram:  She usually has updated mammograms every 6 months to 1 year. Last completed 09/27/2019.  Immunizations:  Influenza vaccine last received  12/09/2020. Covid-19 vaccine last received 05/29/2019. Tdap not yet received; we will administer this today.  Diet:  She is on her second dose of Zepbound. She has noticed feeling slightly less hungry lately.  Dental:  She is not UTD.  Vision:  She is UTD. Her last eye exam was in December 2023.  Denies having any fever, joint pain, new moles, congestion, sinus pain, sore throat, chest pain, palpitations, cough, SOB, wheezing, n/v/d, constipation, blood in stool, dysuria, frequency, hematuria, at this time.  Past Medical History:  Diagnosis Date   Ankle weakness 02/06/2021   Bilateral hand swelling 05/30/2022   BMI 40.0-44.9, adult (HCC) 05/30/2022   Carpal tunnel syndrome 06/20/2014   Complete tear of ligament of ankle 02/08/2021   Daytime somnolence 02/06/2021   Depression 07/27/2019   Dysrhythmia    PAPLITATIONS   Essential hypertension 06/10/2017   Fatty liver 02/06/2021   Headache    HX  MIGRAINES   Hyperlipidemia, mixed 10/10/2014   Hypertriglyceridemia 06/10/2017   Leg pain, right 06/20/2014   Lumbar degenerative disc disease 07/28/2014   Lumbar radiculopathy 07/28/2014   Murmur, cardiac 05/30/2022   Neck pain 08/02/2016   Obesity (BMI 30-39.9) 10/10/2014   Palpitations 06/10/2017   PONV (postoperative nausea and vomiting)    Prediabetes 07/27/2019   Preventative health care 09/27/2014   Sees Dr Arrie Aran for GYN   Snoring 06/10/2017   Spondylolisthesis at L5-S1 level 07/14/2017    Past Surgical History:  Procedure Laterality Date   ABDOMINAL EXPOSURE N/A 07/14/2017   Procedure: ABDOMINAL EXPOSURE;  Surgeon: Larina Earthly, MD;  Location: [facility] OR;  Service: Vascular;  Laterality: N/A;   ANTERIOR LUMBAR FUSION N/A 07/14/2017   Procedure: Anterior Lumbar Interbody Fusion Lumbar five-Sacral one;  Surgeon: Donalee Citrin, MD;  Location: [facility] OR;  Service: Neurosurgery;  Laterality: N/A;   BTL     2012   CESAREAN SECTION     X2    Family History  Problem Relation  Age of Onset   Hypertension Mother    Hyperlipidemia Mother    Hypertension Father    Cancer Father        lung, previous light smoker   Alcoholism Father    Alcohol abuse Maternal Grandfather    Heart disease Maternal Grandfather        MI   Dementia Paternal Grandfather    Hypertension Sister     Social History   Socioeconomic History   Marital status: Married    Spouse name: Not on file   Number of children: Not on file   Years of education: Not on file   Highest education level: Not on file  Occupational History   Occupation: Teacher Assitant  Tobacco Use   Smoking status: Never   Smokeless tobacco: Never  Vaping Use   Vaping Use: Never used  Substance and Sexual Activity   Alcohol use: No    Alcohol/week: 0.0 standard drinks of alcohol   Drug use: No   Sexual activity: Yes    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, no dietary restrictions, 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[employer]  Other Topics Concern   Not on file  Social History Narrative   Works as a Geologist, engineering first grade   2 children   Married   Enjoys watching her kids sports and going to the [employer]   Social Determinants of Corporate investment banker Strain: Not on [employer] Insecurity: Not on file  Transportation Needs: Not on file  Physical Activity: Not on file  Stress: Not on file  Social Connections: Not on file  Intimate Partner Violence: Not on file    Outpatient Medications Prior to Visit  Medication Sig Dispense Refill   aspirin 81 MG chewable tablet Chew 81 mg by mouth daily.     aspirin-acetaminophen-caffeine (EXCEDRIN MIGRAINE) 250-250-65 MG tablet Take 2 tablets by mouth 2 (two) times daily as needed for headache.     Biotin 5000 MCG TABS Take 5,000 mcg by mouth daily.     furosemide (LASIX) 20 MG tablet Take 1 tablet (20 mg total) by mouth daily. As needed for swelling 30 tablet 0   losartan-hydrochlorothiazide (HYZAAR) 100-12.5 MG tablet TAKE 1 TABLET BY MOUTH DAILY 90 tablet 0    Multiple Vitamin (MULTIVITAMIN) tablet Take 1 tablet by mouth daily.     tirzepatide (ZEPBOUND) 2.5 MG/0.5ML Pen Inject 2.5 mg into the skin once a week. 2 mL 2   TURMERIC PO Take 1 tablet by mouth daily.     valACYclovir (VALTREX) 1000 MG tablet Take 2 g by mouth 2 (two) times daily as needed. Cold sores.  6   VITAMIN D, CHOLECALCIFEROL, PO Take 1 tablet by mouth daily.     No facility-administered medications prior to visit.    No Known Allergies  Review of Systems  Constitutional:  Positive for malaise/fatigue. Negative for fever.       (+) Hot flashes  HENT:  Negative for congestion, sinus pain  and sore throat.   Respiratory:  Negative for cough, shortness of breath and wheezing.   Cardiovascular:  Negative for chest pain and palpitations.  Gastrointestinal:  Negative for blood in stool, constipation, diarrhea, nausea and vomiting.  Genitourinary:  Negative for dysuria, frequency and hematuria.  Musculoskeletal:  Positive for myalgias (Top of left foot, bilateral heels). Negative for joint pain.  Skin:        (-) New moles.       Objective:    Physical Exam Constitutional:      Appearance: Normal appearance.  HENT:     Head: Normocephalic and atraumatic.     Right Ear: Tympanic membrane, ear canal and external ear normal.     Left Ear: Tympanic membrane, ear canal and external ear normal.  Eyes:     Extraocular Movements: Extraocular movements intact.     Pupils: Pupils are equal, round, and reactive to light.  Cardiovascular:     Rate and Rhythm: Normal rate and regular rhythm.     Heart sounds: Murmur heard.     Systolic murmur is present with a grade of 1/6.     No gallop.  Pulmonary:     Effort: Pulmonary effort is normal. No respiratory distress.     Breath sounds: Normal breath sounds. No wheezing or rales.  Abdominal:     General: Bowel sounds are normal. There is no distension.     Palpations: Abdomen is soft.     Tenderness: There is no abdominal  tenderness. There is no guarding.  Musculoskeletal:        General: Normal range of motion.  Skin:    General: Skin is warm and dry.  Neurological:     General: No focal deficit present.     Mental Status: She is alert and oriented to person, place, and time.     Motor: Motor function is intact.     Coordination: Coordination is intact.     Deep Tendon Reflexes:     Reflex Scores:      Patellar reflexes are 2+ on the right side and 2+ on the left side.    Comments: 5/5 muscle strength of bilateral upper and lower extremities.  Psychiatric:        Mood and Affect: Mood normal.        Behavior: Behavior normal.     BP 117/67 (BP Location: Right Arm, Patient Position: Sitting, Cuff Size: Large)   Pulse 76   Temp 98 F (36.7 C) (Oral)   Resp 16   Ht 5\' 2"  (1.575 m)   Wt 223 lb (101.2 kg)   SpO2 98%   BMI 40.79 kg/m  Wt Readings from Last 3 Encounters:  07/09/22 223 lb (101.2 kg)  05/30/22 225 lb (102.1 kg)  04/12/21 223 lb (101.2 kg)        Assessment & Plan:   Problem List Items Addressed This Visit       Unprioritized   Preventative health care - Primary    Reports pap/mammo up to date per GYN. Will request these records.  Labs as ordered.  It looks like her GYN ordered a cologuard last year. Will request these results from her GYN. Discussed healthy diet/exercise and weight loss. Tdap today. Encouraged her to get flu shot and covid boosters next fall.       Plantar fasciitis, bilateral    History most consistent with plantar fasciitis. Discussed icing, stretching, good supportive footwear, short term course of otc ibuprofen.  Murmur    She has not yet scheduled her echocardiogram. I encouraged her to do so. If echo looks ok, advised her we can cancel her cardiology consult.       Relevant Orders   ECHOCARDIOGRAM COMPLETE   Lumbar radiculopathy    I suspect that this is the cause for the occasional burning on the top of her left foot.  This is mild and  intermittent. Will monitor.       Hypertriglyceridemia   Relevant Orders   Lipid panel   Fatigue    New. She snores, sleeps poorly, is morbidly obese. Recommended sleep study to rule out OSA.       Relevant Orders   Ambulatory referral to Pulmonology   Other Visit Diagnoses     Encounter for hepatitis C screening test for low risk patient       Relevant Orders   Hepatitis C Antibody   Encounter for screening for HIV       Relevant Orders   HIV antibody (with reflex)   Need for Tdap vaccination       Relevant Orders   Tdap vaccine greater than or equal to 7yo IM (Completed)        No orders of the defined types were placed in this encounter.   I, Lemont Fillers, NP, personally preformed the services described in this documentation.  All medical record entries made by the scribe were at my direction and in my presence.  I have reviewed the chart and discharge instructions (if applicable) and agree that the record reflects my personal performance and is accurate and complete. 07/09/2022.  I,Mathew Stumpf,acting as a Neurosurgeon for [employer], NP.,have documented all relevant documentation on the behalf of Lemont Fillers, NP,as directed by  Lemont Fillers, NP while in the presence of Lemont Fillers, NP.   Lemont Fillers, NP

## 2022-07-09 NOTE — Assessment & Plan Note (Addendum)
History most consistent with plantar fasciitis. Discussed icing, stretching, good supportive footwear, short term course of otc ibuprofen.

## 2022-07-09 NOTE — Assessment & Plan Note (Signed)
New. She snores, sleeps poorly, is morbidly obese. Recommended sleep study to rule out OSA.

## 2022-07-09 NOTE — Assessment & Plan Note (Signed)
She has not yet scheduled her echocardiogram. I encouraged her to do so. If echo looks ok, advised her we can cancel her cardiology consult.

## 2022-07-09 NOTE — Assessment & Plan Note (Signed)
I suspect that this is the cause for the occasional burning on the top of her left foot.  This is mild and intermittent. Will monitor.

## 2022-07-10 LAB — LIPID PANEL
Cholesterol: 177 mg/dL (ref 0–200)
HDL: 54.2 mg/dL (ref >39.00)
NonHDL: 122.89
Total CHOL/HDL Ratio: 3
Triglycerides: 255 mg/dL — ABNORMAL HIGH (ref 0.0–149.0)
VLDL: 51 mg/dL — ABNORMAL HIGH (ref 0.0–40.0)

## 2022-07-10 LAB — HEPATITIS C ANTIBODY: Hepatitis C Ab: NONREACTIVE

## 2022-07-10 LAB — HIV ANTIBODY (ROUTINE TESTING W REFLEX): HIV 1&2 Ab, 4th Generation: NONREACTIVE

## 2022-07-10 LAB — LDL CHOLESTEROL, DIRECT: Direct LDL: 100 mg/dL

## 2022-07-11 ENCOUNTER — Encounter: Payer: Self-pay | Admitting: Family

## 2022-07-11 DIAGNOSIS — E781 Pure hyperglyceridemia: Secondary | ICD-10-CM

## 2022-07-11 MED ORDER — FISH OIL 1000 MG PO CPDR: 3.0000 | DELAYED_RELEASE_CAPSULE | Freq: Two times a day (BID) | ORAL | Status: DC

## 2022-07-14 NOTE — Assessment & Plan Note (Signed)
Zepbound ordered, previously on saxenda

## 2022-07-16 ENCOUNTER — Ambulatory Visit: Payer: BC Managed Care – PPO | Admitting: Cardiology

## 2022-07-22 ENCOUNTER — Telehealth: Payer: Self-pay

## 2022-07-22 NOTE — Telephone Encounter (Signed)
PA initiated via Covermymeds; KEY: [id number].   Clinical Override not needed

## 2022-07-30 ENCOUNTER — Other Ambulatory Visit (HOSPITAL_COMMUNITY): Payer: Self-pay

## 2022-07-30 NOTE — Telephone Encounter (Signed)
Received PA request for Zepbound 2.5mg . Per previous note, insurance only allows one fill of each mg up to 7.5mg . Ran test claim for 5mg , received paid claim. Is patient able to titrate up to the next dose?

## 2022-07-31 ENCOUNTER — Other Ambulatory Visit: Payer: Self-pay | Admitting: Family Medicine

## 2022-07-31 MED ORDER — ZEPBOUND 5 MG/0.5ML ~~LOC~~ SOAJ: 5.0000 mg | SUBCUTANEOUS | 1 refills | Status: DC

## 2022-07-31 NOTE — Telephone Encounter (Signed)
Dr.Blyth stated its ok to send 5 mg Zepbound

## 2022-07-31 NOTE — Addendum Note (Signed)
Addended by: Kathi Ludwig on: 07/31/2022 08:29 AM   Modules accepted: Orders

## 2022-08-06 ENCOUNTER — Encounter: Payer: Self-pay | Admitting: Family

## 2022-08-07 ENCOUNTER — Ambulatory Visit (HOSPITAL_BASED_OUTPATIENT_CLINIC_OR_DEPARTMENT_OTHER): Payer: BC Managed Care – PPO

## 2022-08-13 ENCOUNTER — Institutional Professional Consult (permissible substitution): Payer: BC Managed Care – PPO | Admitting: Nurse Practitioner

## 2022-08-14 ENCOUNTER — Encounter: Payer: Self-pay | Admitting: Nurse Practitioner

## 2022-08-14 ENCOUNTER — Ambulatory Visit (INDEPENDENT_AMBULATORY_CARE_PROVIDER_SITE_OTHER): Payer: BC Managed Care – PPO | Admitting: Nurse Practitioner

## 2022-08-14 VITALS — BP 110/78 | HR 87 | Temp 98.5°F | Ht 62.0 in | Wt 220.0 lb

## 2022-08-14 DIAGNOSIS — R0683 Snoring: Secondary | ICD-10-CM

## 2022-08-14 DIAGNOSIS — G4719 Other hypersomnia: Secondary | ICD-10-CM | POA: Insufficient documentation

## 2022-08-14 NOTE — Assessment & Plan Note (Signed)
BMI 40. Healthy weight loss encouraged.  

## 2022-08-14 NOTE — Patient Instructions (Signed)
Given your symptoms, I am concerned that you may have sleep disordered breathing with sleep apnea. You will need a sleep study for further evaluation. Someone will contact you to schedule this.   We discussed how untreated sleep apnea puts an individual at risk for cardiac arrhthymias, pulm HTN, DM, stroke and increases their risk for daytime accidents. We also briefly reviewed treatment options including weight loss, side sleeping position, oral appliance, CPAP therapy or referral to ENT for possible surgical options  Use caution when driving and pull over if you become sleepy.  Follow up in 6 weeks with Tammy Tamberly Pomplun,NP to go over sleep study results, or sooner, if needed   

## 2022-08-14 NOTE — Assessment & Plan Note (Signed)
She has snoring, excessive daytime sleepiness, restless sleep. BMI 40. History of HTN. Epworth 9. Given this,  I am concerned she could have sleep disordered breathing with obstructive sleep apnea. She will need sleep study for further evaluation.    - discussed how weight can impact sleep and risk for sleep disordered breathing - discussed options to assist with weight loss: combination of diet modification, cardiovascular and strength training exercises   - had an extensive discussion regarding the adverse health consequences related to untreated sleep disordered breathing - specifically discussed the risks for hypertension, coronary artery disease, cardiac dysrhythmias, cerebrovascular disease, and diabetes - lifestyle modification discussed   - discussed how sleep disruption can increase risk of accidents, particularly when driving - safe driving practices were discussed  Patient Instructions  Given your symptoms, I am concerned that you may have sleep disordered breathing with sleep apnea. You will need a sleep study for further evaluation. Someone will contact you to schedule this.   We discussed how untreated sleep apnea puts an individual at risk for cardiac arrhthymias, pulm HTN, DM, stroke and increases their risk for daytime accidents. We also briefly reviewed treatment options including weight loss, side sleeping position, oral appliance, CPAP therapy or referral to ENT for possible surgical options  Use caution when driving and pull over if you become sleepy.  Follow up in 6 weeks with Katie Mane Consolo,NP to go over sleep study results, or sooner, if needed

## 2022-08-14 NOTE — Progress Notes (Addendum)
@Patient  ID: Tammy Brennan, female    DOB: [birth year], 47 y.o.   MRN: [id number]  Chief Complaint  Patient presents with   Consult    Pt never had a sleep study, daytime sleepiness, loud snoring.     Referring provider: Bradd Canary, MD  HPI: 47 year old female, never smoker previously seen for sleep consult 2021 by Dr. Wynona Neat. Past medical history significant for HTN, fatty liver, HLD, obesity, prediabetes, snoring, depression.  TEST/EVENTS:   08/14/2022: Today - sleep consult  Patient presents today to repeat sleep consult, referred by Dr. Abner Greenspan. She was previously seen here in 2021 for sleep consult but due to problems with COVID, she never completed a sleep study. She has continued to have a lot of trouble with poor quality sleep and feels poorly rested every morning. She has little energy throughout the day and finds herself dozing off at times if she's sitting at home. Her husband says that she snores very loudly. He has not noticed any witnessed apneas. She denies morning headaches, drowsy driving, sleep parasomnias/paralysis. No history of narcolepsy or symptoms of cataplexy.  She goes to bed between 9 and 11 PM.  Falls asleep within 30 minutes.  Wakes 2 or more times a night.  Usually gets up between 540 5 to 6 AM during the school year.  She will try to sleep and on weekends and during the summer.  Will take a melatonin and over-the-counter sleep aid to help her fall and stay asleep at night.  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  Weight is up about 15 pounds since she was last seen here.  Never been on CPAP.  Does not use any home oxygen. She has a history of high blood pressure, well-controlled on antihypertensives.  She is prediabetic.  No history of stroke. She is a never smoker.  Does not drink any alcohol.  Has 1 cup of coffee in the morning.  Works as a Architectural technologist.  Lives with her husband and 2 children.    Epworth 9   No Known  Allergies  Immunization History  Administered Date(s) Administered   Influenza-Unspecified 12/10/2018, 12/09/2020   PFIZER(Purple Top)SARS-COV-2 Vaccination 05/08/2019, 05/29/2019   Tdap 07/09/2022    Past Medical History:  Diagnosis Date   Ankle weakness 02/06/2021   Bilateral hand swelling 05/30/2022   BMI 40.0-44.9, adult (HCC) 05/30/2022   Carpal tunnel syndrome 06/20/2014   Complete tear of ligament of ankle 02/08/2021   Daytime somnolence 02/06/2021   Depression 07/27/2019   Dysrhythmia    PAPLITATIONS   Essential hypertension 06/10/2017   Fatty liver 02/06/2021   Headache    HX  MIGRAINES   Hyperlipidemia, mixed 10/10/2014   Hypertriglyceridemia 06/10/2017   Leg pain, right 06/20/2014   Lumbar degenerative disc disease 07/28/2014   Lumbar radiculopathy 07/28/2014   Murmur, cardiac 05/30/2022   Neck pain 08/02/2016   Obesity (BMI 30-39.9) 10/10/2014   Palpitations 06/10/2017   PONV (postoperative nausea and vomiting)    Prediabetes 07/27/2019   Preventative health care 09/27/2014   Sees Dr Arrie Aran for GYN   Snoring 06/10/2017   Spondylolisthesis at L5-S1 level 07/14/2017    Tobacco History: Social History   Tobacco Use  Smoking Status Never  Smokeless Tobacco Never   Counseling given: Not Answered   Outpatient Medications Prior to Visit  Medication Sig Dispense Refill   aspirin 81 MG chewable tablet Chew 81 mg by mouth daily.  aspirin-acetaminophen-caffeine (EXCEDRIN MIGRAINE) 250-250-65 MG tablet Take 2 tablets by mouth 2 (two) times daily as needed for headache.     Biotin 5000 MCG TABS Take 5,000 mcg by mouth daily.     losartan-hydrochlorothiazide (HYZAAR) 100-12.5 MG tablet TAKE 1 TABLET BY MOUTH DAILY 90 tablet 0   Multiple Vitamin (MULTIVITAMIN) tablet Take 1 tablet by mouth daily.     valACYclovir (VALTREX) 1000 MG tablet Take 2 g by mouth 2 (two) times daily as needed. Cold sores.  6   VITAMIN D, CHOLECALCIFEROL, PO Take 1 tablet by  mouth daily.     furosemide (LASIX) 20 MG tablet Take 1 tablet (20 mg total) by mouth daily. As needed for swelling 30 tablet 0   Omega-3 Fatty Acids (FISH OIL) 1000 MG CPDR Take 3 capsules by mouth in the morning and at bedtime.     tirzepatide (ZEPBOUND) 2.5 MG/0.5ML Pen Inject 2.5 mg into the skin once a week. 2 mL 2   tirzepatide (ZEPBOUND) 5 MG/0.5ML Pen Inject 5 mg into the skin once a week. 5 mL 1   TURMERIC PO Take 1 tablet by mouth daily.     No facility-administered medications prior to visit.     Review of Systems:   Constitutional: No night sweats, fevers, chills, or lassitude. +excessive daytime fatigue, weight gain  HEENT: No headaches, difficulty swallowing, tooth/dental problems, or sore throat. No sneezing, itching, ear ache, nasal congestion, or post nasal drip CV:  No chest pain, orthopnea, PND, swelling in lower extremities, anasarca, dizziness, palpitations, syncope Resp: +snoring. No shortness of breath with exertion or at rest. No excess mucus or change in color of mucus. No productive or non-productive. No hemoptysis. No wheezing.  No chest wall deformity GI:  No heartburn, indigestion GU: No dysuria, change in color of urine, urgency or frequency MSK:  No joint pain or swelling.   Neuro: No gait abnormalities or memory impairment.  Psych: No depression or anxiety. Mood stable. +sleep disturbance    Physical Exam:  BP 110/78   Pulse 87   Temp 98.5 F (36.9 C) (Oral)   Ht 5\' 2"  (1.575 m)   Wt 220 lb (99.8 kg)   SpO2 96%   BMI 40.24 kg/m   GEN: Pleasant, interactive, well-appearing; obese; in no acute distress HEENT:  Normocephalic and atraumatic. PERRLA. Sclera white. Nasal turbinates pink, moist and patent bilaterally. No rhinorrhea present. Oropharynx pink and moist, without exudate or edema. No lesions, ulcerations, or postnasal drip. Mallampati III NECK:  Supple w/ fair ROM. No JVD present. Normal carotid impulses w/o bruits. Thyroid symmetrical with  no goiter or nodules palpated. No lymphadenopathy.   CV: RRR, no m/r/g, no peripheral edema. Pulses intact, +2 bilaterally. No cyanosis, pallor or clubbing. PULMONARY:  Unlabored, regular breathing. Clear bilaterally A&P w/o wheezes/rales/rhonchi. No accessory muscle use.  GI: BS present and normoactive. Soft, non-tender to palpation. No organomegaly or masses detected.  MSK: No erythema, warmth or tenderness. Cap refil <2 sec all extrem. No deformities or joint swelling noted.  Neuro: A/Ox3. No focal deficits noted.   Skin: Warm, no lesions or rashe Psych: Normal affect and behavior. Judgement and thought content appropriate.     Lab Results:  CBC    Component Value Date/Time   WBC 8.7 05/30/2022 1310   RBC 4.20 05/30/2022 1310   HGB 12.9 05/30/2022 1310   HGB 13.2 09/16/2019 1354   HCT 37.9 05/30/2022 1310   HCT 40.5 09/16/2019 1354   PLT 401.0 (H) 05/30/2022  1310   PLT 414 09/16/2019 1354   MCV 90.3 05/30/2022 1310   MCV 93 09/16/2019 1354   MCH 30.1 09/16/2019 1354   MCH 30.4 07/07/2017 0944   MCHC 34.0 05/30/2022 1310   RDW 13.1 05/30/2022 1310   RDW 12.1 09/16/2019 1354   LYMPHSABS 2.4 05/30/2022 1310   LYMPHSABS 2.5 09/16/2019 1354   MONOABS 0.6 05/30/2022 1310   EOSABS 0.4 05/30/2022 1310   EOSABS 0.8 (H) 09/16/2019 1354   BASOSABS 0.0 05/30/2022 1310   BASOSABS 0.0 09/16/2019 1354    BMET    Component Value Date/Time   NA 139 05/30/2022 1310   NA 139 05/20/2019 1410   K 4.3 05/30/2022 1310   CL 100 05/30/2022 1310   CO2 31 05/30/2022 1310   GLUCOSE 98 05/30/2022 1310   BUN 16 05/30/2022 1310   BUN 12 05/20/2019 1410   CREATININE 0.67 05/30/2022 1310   CREATININE 0.70 11/17/2020 1606   CALCIUM 9.4 05/30/2022 1310   GFRNONAA 106 05/20/2019 1410   GFRAA 123 05/20/2019 1410    BNP    Component Value Date/Time   BNP 4 11/17/2020 1606     Imaging:  No results found.        No data to display          No results found for:  "NITRICOXIDE"      Assessment & Plan:   Excessive daytime sleepiness She has snoring, excessive daytime sleepiness, restless sleep. BMI 40. History of HTN. Epworth 9. Given this,  I am concerned she could have sleep disordered breathing with obstructive sleep apnea. She will need sleep study for further evaluation.    - discussed how weight can impact sleep and risk for sleep disordered breathing - discussed options to assist with weight loss: combination of diet modification, cardiovascular and strength training exercises   - had an extensive discussion regarding the adverse health consequences related to untreated sleep disordered breathing - specifically discussed the risks for hypertension, coronary artery disease, cardiac dysrhythmias, cerebrovascular disease, and diabetes - lifestyle modification discussed   - discussed how sleep disruption can increase risk of accidents, particularly when driving - safe driving practices were discussed  Patient Instructions  Given your symptoms, I am concerned that you may have sleep disordered breathing with sleep apnea. You will need a sleep study for further evaluation. Someone will contact you to schedule this.   We discussed how untreated sleep apnea puts an individual at risk for cardiac arrhthymias, pulm HTN, DM, stroke and increases their risk for daytime accidents. We also briefly reviewed treatment options including weight loss, side sleeping position, oral appliance, CPAP therapy or referral to ENT for possible surgical options  Use caution when driving and pull over if you become sleepy.  Follow up in 6 weeks with Katie Nicandro Perrault,NP to go over sleep study results, or sooner, if needed     Morbid obesity (HCC) BMI 40. Healthy weight loss encouraged.   I spent 35 minutes of dedicated to the care of this patient on the date of this encounter to include pre-visit review of records, face-to-face time with the patient discussing conditions  above, post visit ordering of testing, clinical documentation with the electronic health record, making appropriate referrals as documented, and communicating necessary findings to members of the patients care team.  Tammy Chapel, NP 08/14/2022  Pt aware and understands NP's role.

## 2022-09-04 ENCOUNTER — Ambulatory Visit (INDEPENDENT_AMBULATORY_CARE_PROVIDER_SITE_OTHER): Payer: BC Managed Care – PPO | Admitting: Family Medicine

## 2022-09-04 ENCOUNTER — Encounter: Payer: Self-pay | Admitting: Family Medicine

## 2022-09-04 VITALS — BP 122/80 | HR 99 | Temp 98.5°F | Ht 62.0 in | Wt 221.0 lb

## 2022-09-04 DIAGNOSIS — R051 Acute cough: Secondary | ICD-10-CM | POA: Diagnosis not present

## 2022-09-04 MED ORDER — BENZONATATE 200 MG PO CAPS
200.0000 mg | ORAL_CAPSULE | Freq: Two times a day (BID) | ORAL | 0 refills | Status: DC | PRN
Start: 2022-09-04 — End: 2023-03-11

## 2022-09-04 MED ORDER — HYDROCOD POLI-CHLORPHE POLI ER 10-8 MG/5ML PO SUER
5.0000 mL | Freq: Two times a day (BID) | ORAL | 0 refills | Status: AC | PRN
Start: 2022-09-04 — End: 2022-09-09

## 2022-09-04 MED ORDER — AZITHROMYCIN 250 MG PO TABS
ORAL_TABLET | ORAL | 0 refills | Status: AC
Start: 2022-09-04 — End: 2022-09-09

## 2022-09-04 MED ORDER — PREDNISONE 20 MG PO TABS
40.0000 mg | ORAL_TABLET | Freq: Every day | ORAL | 0 refills | Status: AC
Start: 2022-09-04 — End: 2022-09-09

## 2022-09-04 MED ORDER — CETIRIZINE HCL 10 MG PO TABS
10.0000 mg | ORAL_TABLET | Freq: Every day | ORAL | 11 refills | Status: DC
Start: 2022-09-04 — End: 2023-03-11

## 2022-09-04 NOTE — Progress Notes (Signed)
Acute Office Visit  Subjective:     Patient ID: Tammy Brennan, female    DOB: May 13, 1975, 47 y.o.   MRN: [id number]  Chief Complaint  Patient presents with   Cough     Patient is in today for cough  Discussed the use of AI scribe software for clinical note transcription with the patient, who gave verbal consent to proceed.  History of Present Illness   The patient presents with a recurrent cough that occurs approximately twice a year. The cough is described as severe, sometimes to the point of gagging, and is mostly dry with occasional periods of being productive. The most recent episode began at the end of May and was treated with a course of generic Singulair, which initially improved the symptoms. However, the cough has since returned and is now associated with wheezing. The patient denies any history of asthma or allergies.  In the period between the end of May and the current episode, the patient was asymptomatic but reported hearing fluid in their chest. The current episode began on Monday, with the patient experiencing aches and chills but no fever. The cough is worse when lying down and has been disruptive to the patient's sleep. The patient also reports pressure in the right ear but denies any nasal symptoms.              All review of systems negative except what is listed in the HPI      Objective:    BP 122/80   Pulse 99   Temp 98.5 F (36.9 C) (Oral)   Ht 5\' 2"  (1.575 m)   Wt 221 lb (100.2 kg)   SpO2 97%   BMI 40.42 kg/m    Physical Exam Vitals reviewed.  Constitutional:      Appearance: Normal appearance.  Cardiovascular:     Rate and Rhythm: Normal rate and regular rhythm.     Pulses: Normal pulses.     Heart sounds: Normal heart sounds.  Pulmonary:     Effort: Pulmonary effort is normal.     Breath sounds: Normal breath sounds.     Comments: Tight breath sounds throughout Skin:    General: Skin is warm and dry.  Neurological:      Mental Status: She is alert and oriented to person, place, and time.  Psychiatric:        Mood and Affect: Mood normal.        Behavior: Behavior normal.        Thought Content: Thought content normal.        Judgment: Judgment normal.     No results found for any visits on 09/04/22.      Assessment & Plan:   Problem List Items Addressed This Visit   None Visit Diagnoses     Acute cough    -  Primary   Relevant Medications   benzonatate (TESSALON) 200 MG capsule   predniSONE (DELTASONE) 20 MG tablet   azithromycin (ZITHROMAX) 250 MG tablet   cetirizine (ZYRTEC) 10 MG tablet   chlorpheniramine-HYDROcodone (TUSSIONEX) 10-8 MG/5ML     Assessment and Plan    Recurrent Cough: Patient reports recurrent episodes of cough, sometimes dry, sometimes productive, with associated wheezing. No history of asthma or allergies. Recent episode improved with Singulair. Current episode started this week, with associated ear pressure and possible postnasal drainage. Physical exam reveals tight breath sounds and retracted eardrums. -Start Prednisone for a few days to reduce inflammation. -Start Zyrtec for possible  allergic component. -Provide Tessalon Perles and Tussionex cough syrup for symptomatic relief. -If no improvement in a few days, start Zithromax (Z-Pak) in case of bacterial bronchitis. -Consider referral to allergist if recurrent episodes continue.  Right Ear Pressure: Patient reports pressure in right ear. Physical exam reveals retracted eardrum, suggesting Eustachian tube dysfunction possibly related to postnasal drainage. -Start Zyrtec for possible allergic component. -Continue to monitor.        Meds ordered this encounter  Medications   benzonatate (TESSALON) 200 MG capsule    Sig: Take 1 capsule (200 mg total) by mouth 2 (two) times daily as needed for cough.    Dispense:  20 capsule    Refill:  0    Order Specific Question:   Supervising Provider    Answer:   Danise Edge A [[id number]]   predniSONE (DELTASONE) 20 MG tablet    Sig: Take 2 tablets (40 mg total) by mouth daily with breakfast for 5 days.    Dispense:  10 tablet    Refill:  0    Order Specific Question:   Supervising Provider    Answer:   Danise Edge A [[id number]]   azithromycin (ZITHROMAX) 250 MG tablet    Sig: Take 2 tablets on day 1, then 1 tablet daily on days 2 through 5    Dispense:  6 tablet    Refill:  0    Order Specific Question:   Supervising Provider    Answer:   Danise Edge A [[id number]]   cetirizine (ZYRTEC) 10 MG tablet    Sig: Take 1 tablet (10 mg total) by mouth daily.    Dispense:  30 tablet    Refill:  11    Order Specific Question:   Supervising Provider    Answer:   Danise Edge A [[id number]]   chlorpheniramine-HYDROcodone (TUSSIONEX) 10-8 MG/5ML    Sig: Take 5 mLs by mouth every 12 (twelve) hours as needed for up to 5 days.    Dispense:  50 mL    Refill:  0    Order Specific Question:   Supervising Provider    Answer:   Danise Edge A [[id number]]    Return if symptoms worsen or fail to improve.  Clayborne Dana, NP

## 2022-09-04 NOTE — Patient Instructions (Signed)
Start with Zyrtec, cough medicines provided, and prednisone. If not improving in 2-3 days, start the Zpak. Continue supportive measures including rest, hydration, humidifier use, steam showers, warm compresses to sinuses, warm liquids with lemon and honey, and over-the-counter cough, cold, and analgesics as needed.  If this continues to recur, might be worth getting allergy tested.

## 2022-09-13 ENCOUNTER — Encounter: Payer: Self-pay | Admitting: Family Medicine

## 2022-09-13 IMAGING — DX DG CHEST 2V
2 series · 2 of 2 positions shown · non-contrast
Comparison: 09/21/2020

CLINICAL DATA: Bilateral lower extremity edema for 6 days

EXAM:
CHEST - 2 VIEW

[chest pa]
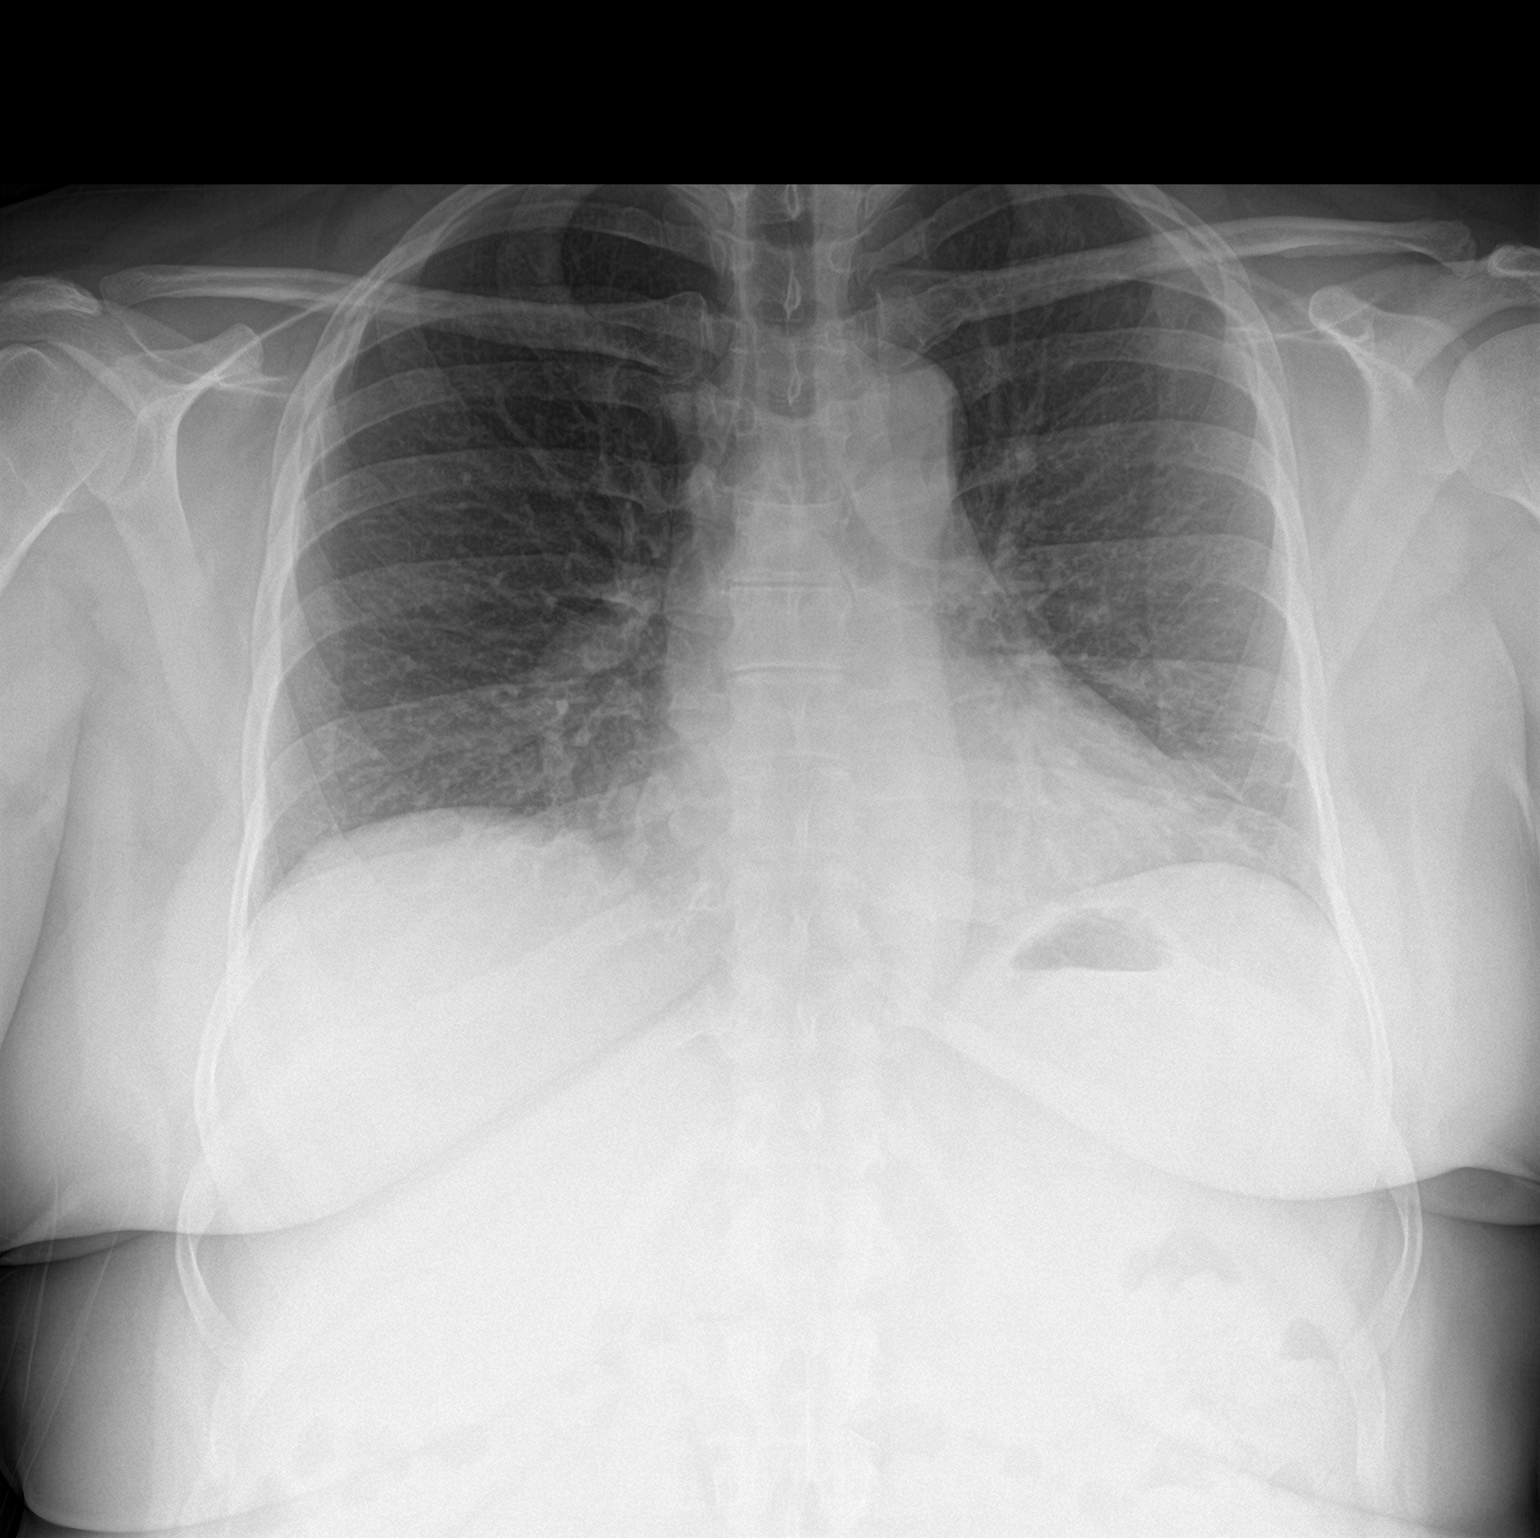

[chest lat]
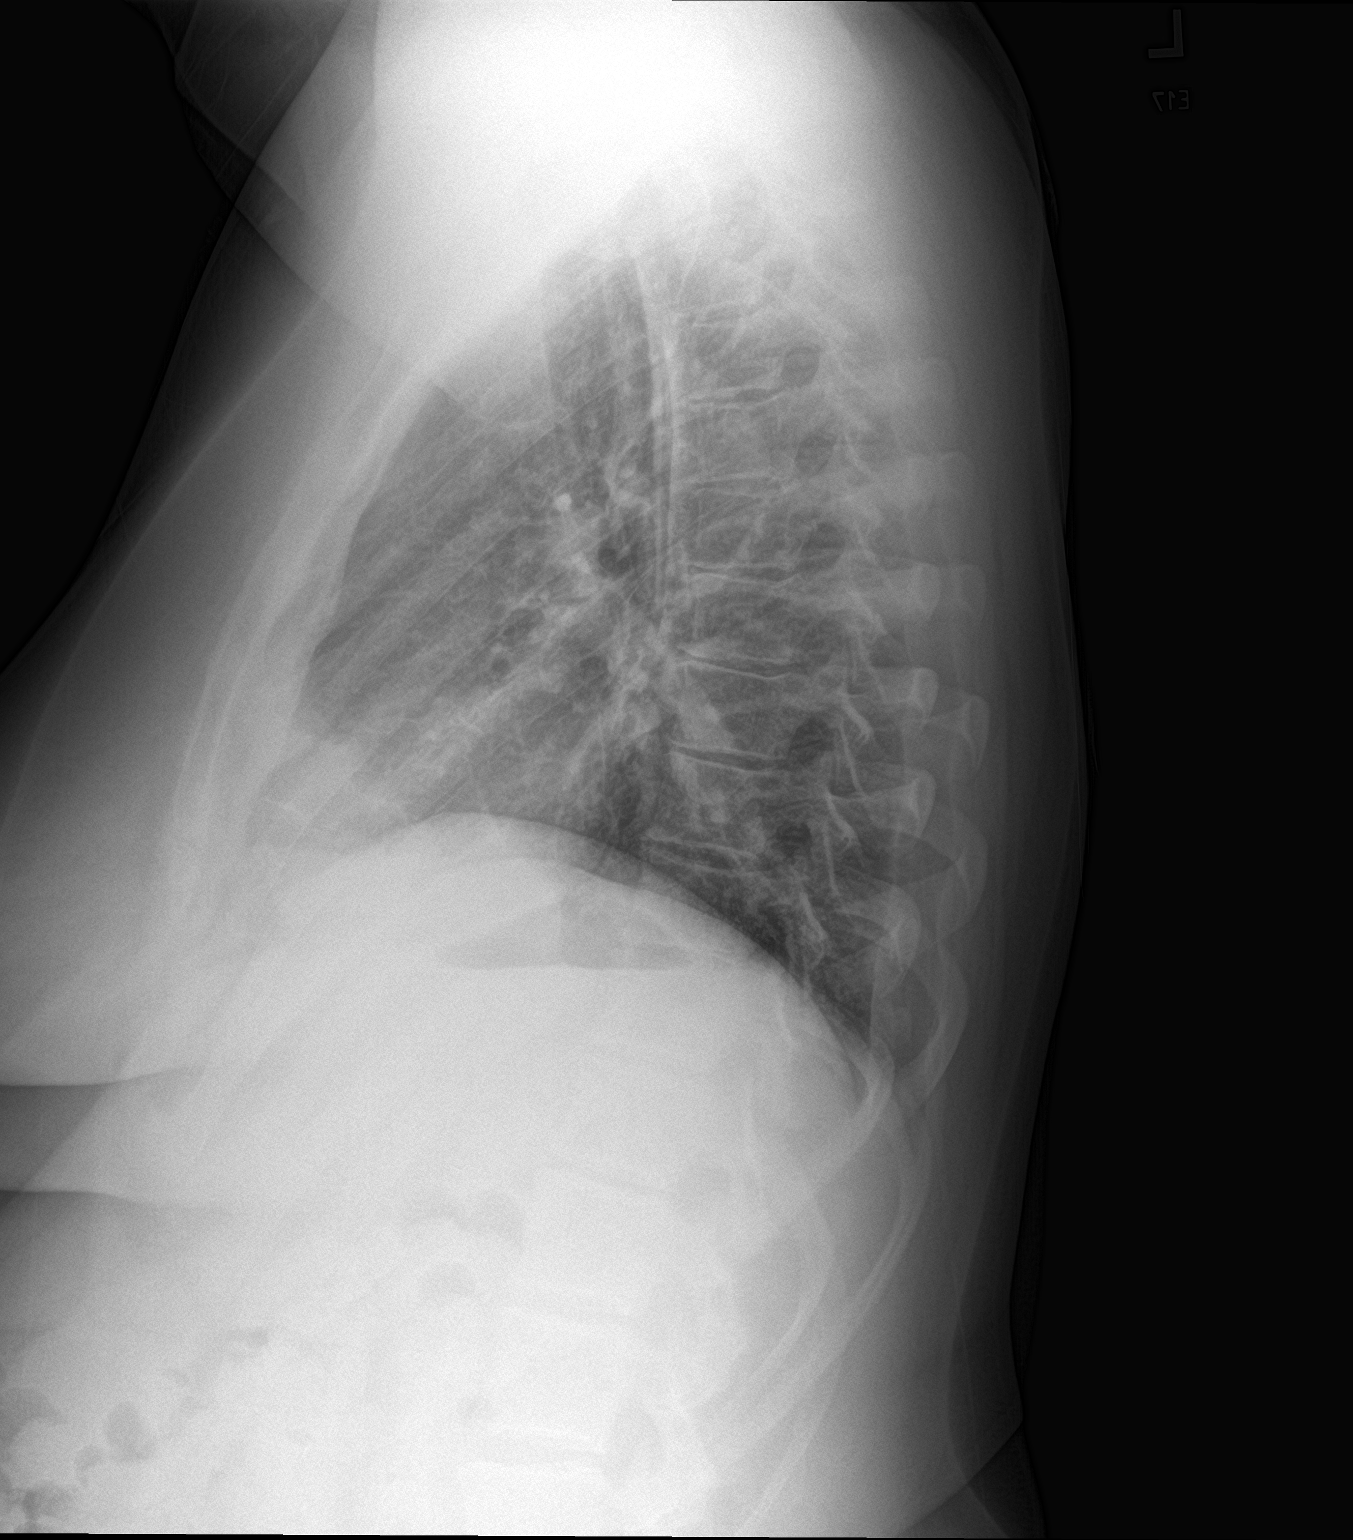

[2 of 2 positions shown; findings below may reference images not displayed]

FINDINGS: Frontal and lateral views of the chest demonstrate a stable cardiac
silhouette. There is mild central vascular congestion and
interstitial prominence without focal airspace disease, effusion, or
pneumothorax. No acute bony abnormalities.
IMPRESSION: 1. Mild interstitial edema.

## 2022-09-14 IMAGING — US US EXTREM LOW VENOUS
1 series · 14 of 24 positions shown · non-contrast
Comparison: None.

CLINICAL DATA: Six weeks of bilateral lower extremity edema.
Elevated D-dimer.

EXAM:
Bilateral LOWER EXTREMITY VENOUS DOPPLER ULTRASOUND
TECHNIQUE: Gray-scale sonography with compression, as well as color and duplex
ultrasound, were performed to evaluate the deep venous system(s)
from the level of the common femoral vein through the popliteal and
proximal calf veins.

[Series 1: us extrem low venous · 14 of 57 slices shown]
[im 1/57]
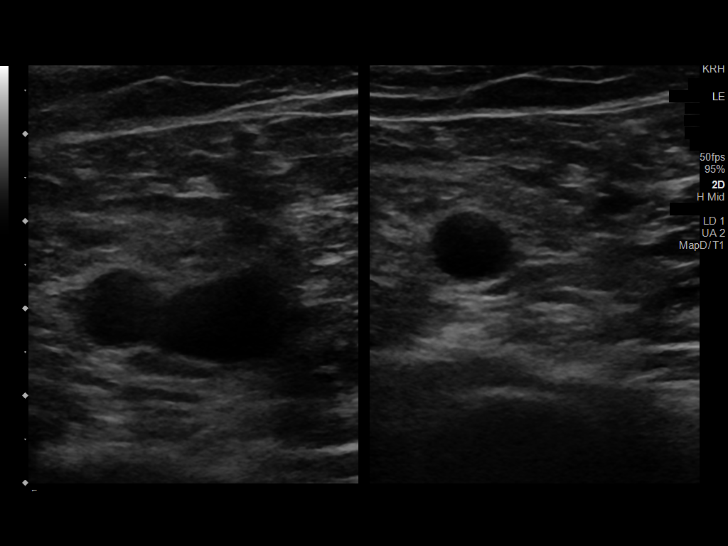
[im 5/57]
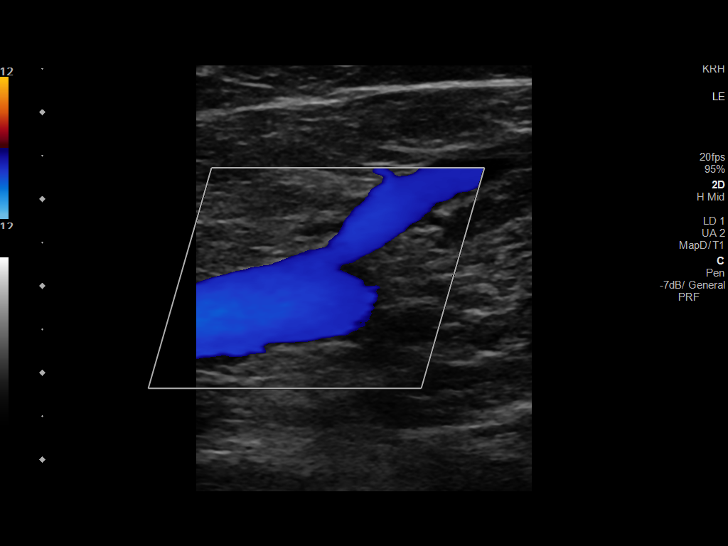
[im 10/57]
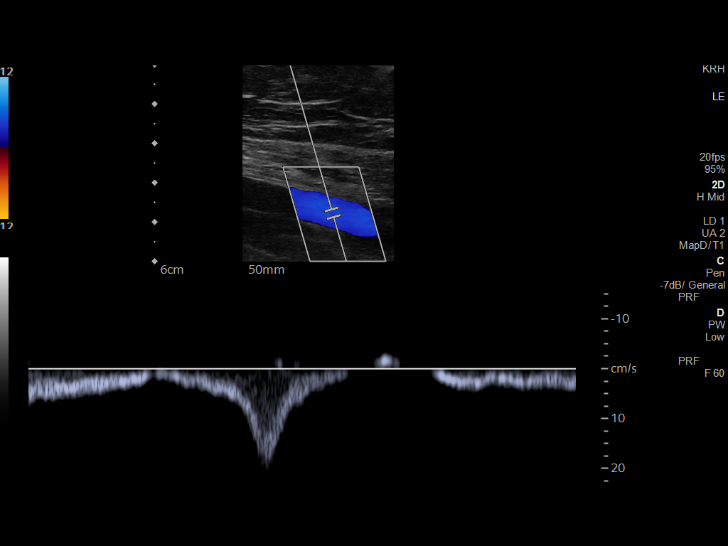
[im 15/57]
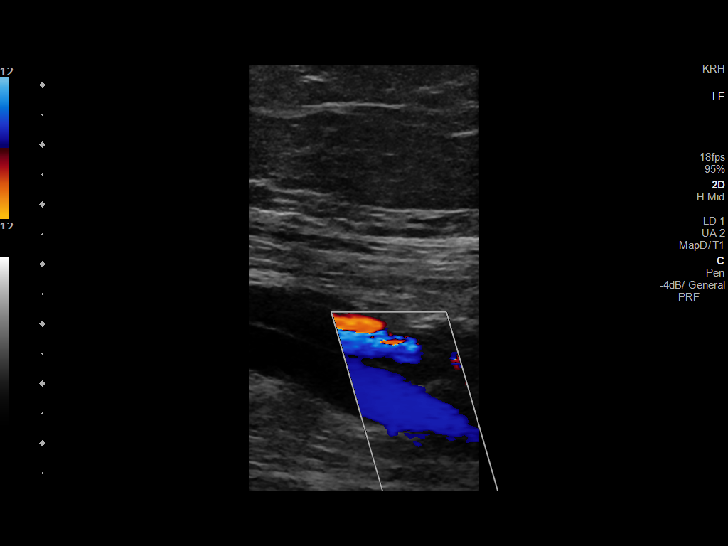
[im 18/57]
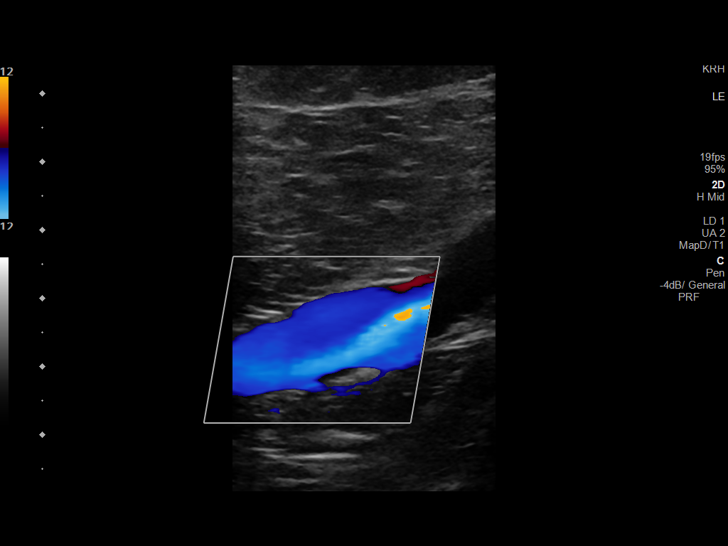
[im 22/57]
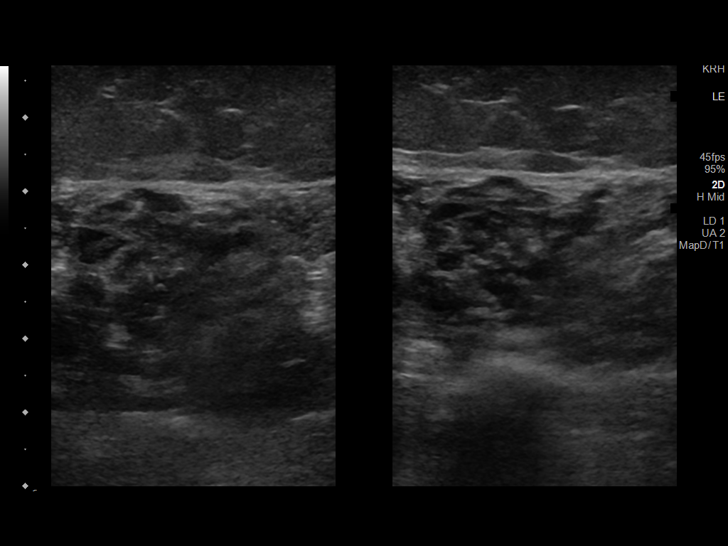
[im 27/57]
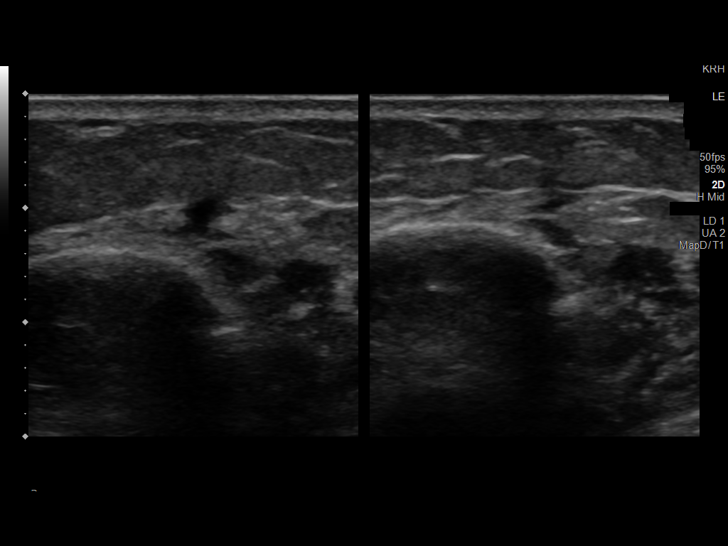
[im 30/57]
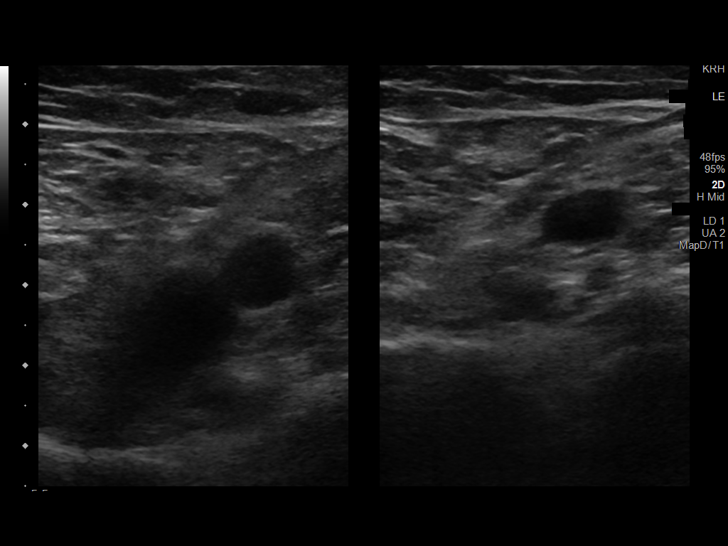
[im 35/57]
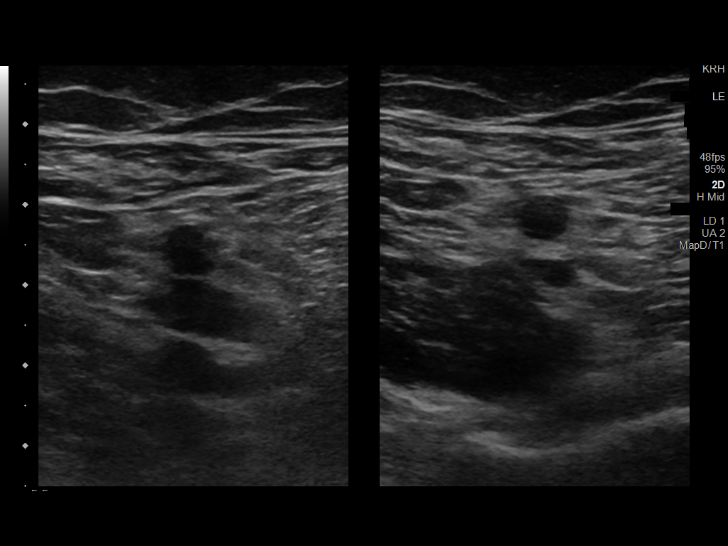
[im 39/57]
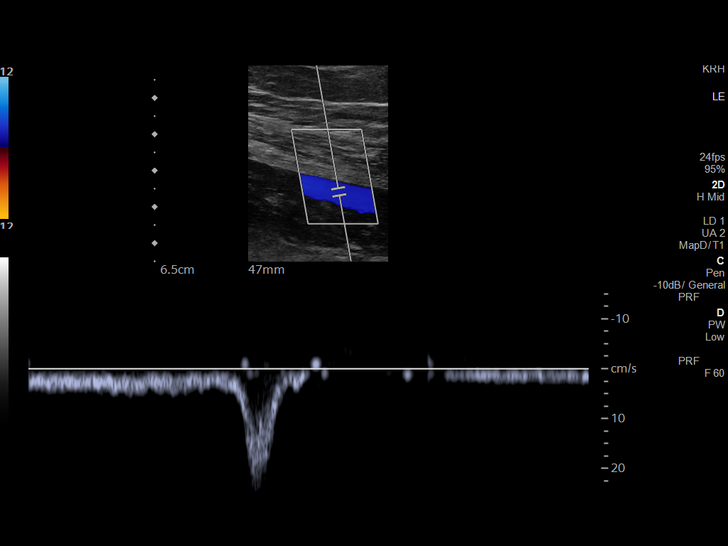
[im 44/57]
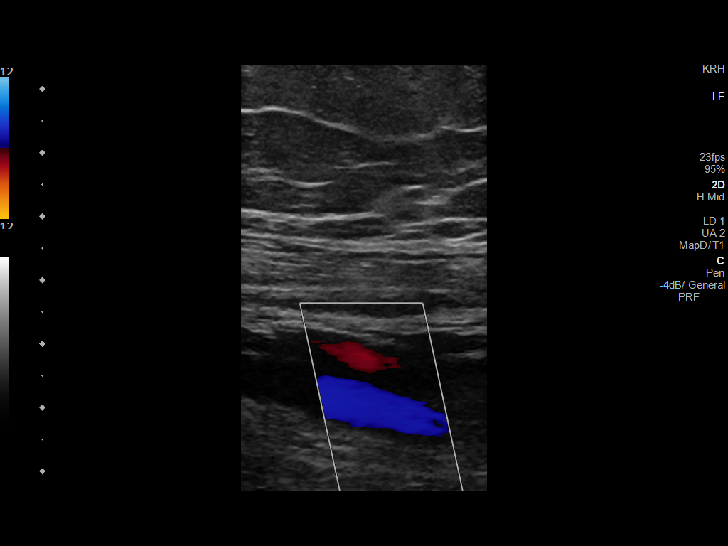
[im 47/57]
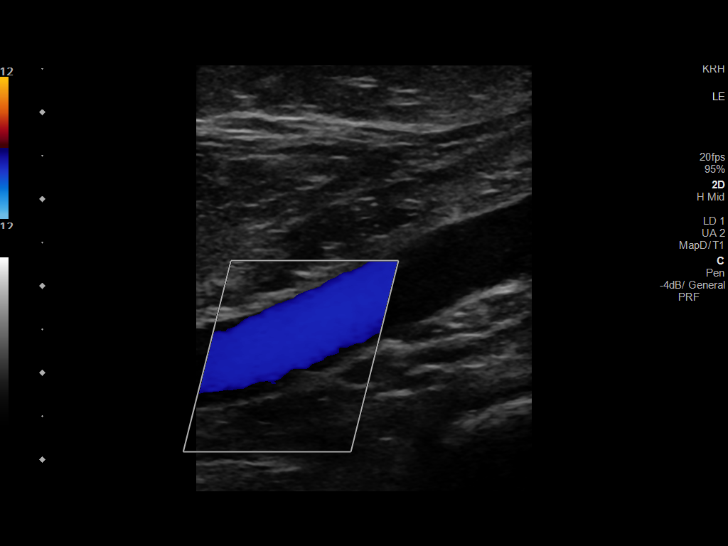
[im 52/57]
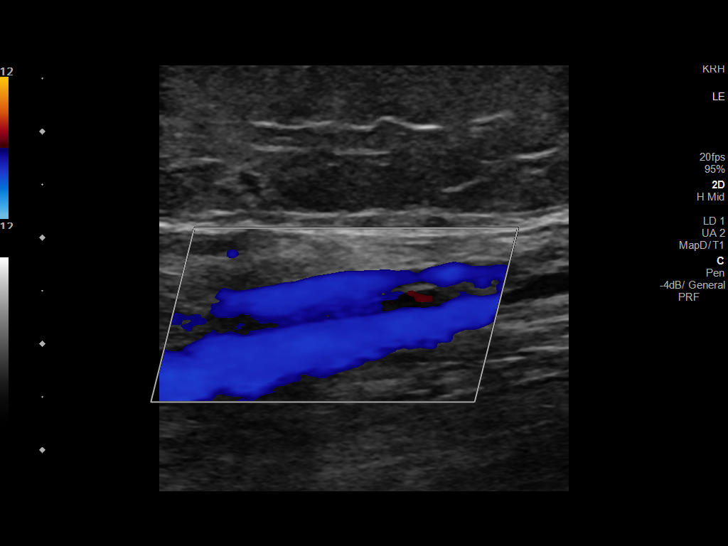
[im 57/57]
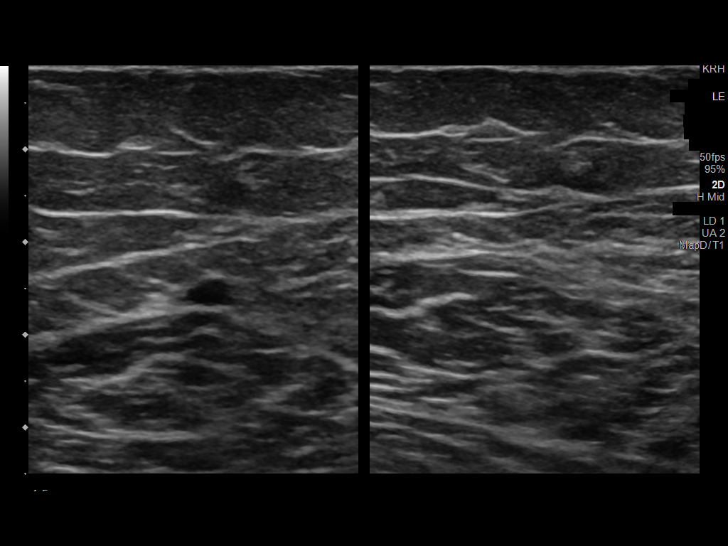

[14 of 24 positions shown; findings below may reference images not displayed]

FINDINGS: VENOUS

Normal compressibility of the common femoral, superficial femoral,
and popliteal veins, as well as the visualized calf veins.
Visualized portions of profunda femoral vein and great saphenous
vein unremarkable. No filling defects to suggest DVT on grayscale or
color Doppler imaging. Doppler waveforms show normal direction of
venous flow, normal respiratory plasticity and response to
augmentation.

OTHER

None.

Limitations: none
IMPRESSION: No evidence of lower extremity deep venous thrombosis bilaterally.

## 2022-09-27 ENCOUNTER — Encounter (INDEPENDENT_AMBULATORY_CARE_PROVIDER_SITE_OTHER): Payer: BC Managed Care – PPO

## 2022-09-27 DIAGNOSIS — G4733 Obstructive sleep apnea (adult) (pediatric): Secondary | ICD-10-CM

## 2022-09-27 DIAGNOSIS — R0683 Snoring: Secondary | ICD-10-CM

## 2022-09-27 DIAGNOSIS — G4719 Other hypersomnia: Secondary | ICD-10-CM

## 2022-10-01 ENCOUNTER — Ambulatory Visit: Payer: BC Managed Care – PPO | Admitting: Nurse Practitioner

## 2022-10-29 ENCOUNTER — Other Ambulatory Visit: Payer: Self-pay | Admitting: Family Medicine

## 2022-11-04 ENCOUNTER — Encounter: Payer: Self-pay | Admitting: Nurse Practitioner

## 2022-11-04 ENCOUNTER — Ambulatory Visit (INDEPENDENT_AMBULATORY_CARE_PROVIDER_SITE_OTHER): Payer: BC Managed Care – PPO | Admitting: Nurse Practitioner

## 2022-11-04 VITALS — BP 120/80 | HR 89 | Temp 97.6°F | Ht 62.0 in | Wt 226.0 lb

## 2022-11-04 DIAGNOSIS — G4733 Obstructive sleep apnea (adult) (pediatric): Secondary | ICD-10-CM | POA: Insufficient documentation

## 2022-11-04 DIAGNOSIS — G4700 Insomnia, unspecified: Secondary | ICD-10-CM

## 2022-11-04 HISTORY — DX: Obstructive sleep apnea (adult) (pediatric): G47.33

## 2022-11-04 MED ORDER — ZOLPIDEM TARTRATE 5 MG PO TABS
5.0000 mg | ORAL_TABLET | Freq: Every evening | ORAL | 0 refills | Status: DC | PRN
Start: 2022-11-04 — End: 2023-03-11

## 2022-11-04 NOTE — Progress Notes (Signed)
@Patient  ID: Tammy Brennan, female    DOB: 02/14/1976, 47 y.o.   MRN: [id number]  Chief Complaint  Patient presents with   Follow-up    F/u after HST.    Referring provider: Bradd Canary, MD  HPI: 47 year old female, never smoker followed for mild OSA and insomnia. Past medical history significant for HTN, fatty liver, HLD, obesity, prediabetes, snoring, depression.  TEST/EVENTS:  09/08/2022 HST: AHI 6.7/h, SpO2 low 84%  08/14/2022: OV with Evalyne Cortopassi NP to repeat sleep consult, referred by Dr. Abner Greenspan. She was previously seen here in 2021 for sleep consult but due to problems with COVID, she never completed a sleep study. She has continued to have a lot of trouble with poor quality sleep and feels poorly rested every morning. She has little energy throughout the day and finds herself dozing off at times if she's sitting at home. Her husband says that she snores very loudly. He has not noticed any witnessed apneas. She denies morning headaches, drowsy driving, sleep parasomnias/paralysis. No history of narcolepsy or symptoms of cataplexy.  She goes to bed between 9 and 11 PM.  Falls asleep within 30 minutes.  Wakes 2 or more times a night.  Usually gets up between 540 5 to 6 AM during the school year.  She will try to sleep and on weekends and during the summer.  Will take a melatonin and over-the-counter sleep aid to help her fall and stay asleep at night.  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  Weight is up about 15 pounds since she was last seen here.  Never been on CPAP.  Does not use any home oxygen. She has a history of high blood pressure, well-controlled on antihypertensives.  She is prediabetic.  No history of stroke. She is a never smoker.  Does not drink any alcohol.  Has 1 cup of coffee in the morning.  Works as a Architectural technologist.  Lives with her husband and 2 children.   [place] 9  11/04/2022: Today - follow up Patient presents today for follow up after undergoing  home sleep study which revealed mild sleep apnea with AHI 6.7/h. She feels unchanged compared to her last visit. She continues to have restless sleep at night and frequent night awakenings. She snores loudly when she's on her back and has woken herself up before. She does use melatonin and an OTC sleep aid but doesn't seem to keep her asleep. She denies any drowsy driving, morning headaches, sleep parasomnias/paralysis. She is working on weight loss measures but has been having trouble finding a medication that is in stock so she keeps having to change prescriptions. She had lost 30 lb with Saxenda.   No Known Allergies  Immunization History  Administered Date(s) Administered   Influenza-Unspecified 12/10/2018, 12/09/2020   PFIZER(Purple Top)SARS-COV-2 Vaccination 05/08/2019, 05/29/2019   Tdap 07/09/2022    Past Medical History:  Diagnosis Date   Ankle weakness 02/06/2021   Bilateral hand swelling 05/30/2022   BMI 40.0-44.9, adult (HCC) 05/30/2022   Carpal tunnel syndrome 06/20/2014   Complete tear of ligament of ankle 02/08/2021   Daytime somnolence 02/06/2021   Depression 07/27/2019   Dysrhythmia    PAPLITATIONS   Essential hypertension 06/10/2017   Fatty liver 02/06/2021   Headache    HX  MIGRAINES   Hyperlipidemia, mixed 10/10/2014   Hypertriglyceridemia 06/10/2017   Leg pain, right 06/20/2014   Lumbar degenerative disc disease 07/28/2014   Lumbar radiculopathy 07/28/2014   Mild  obstructive sleep apnea 11/04/2022   Murmur, cardiac 05/30/2022   Neck pain 08/02/2016   Obesity (BMI 30-39.9) 10/10/2014   Palpitations 06/10/2017   PONV (postoperative nausea and vomiting)    Prediabetes 07/27/2019   Preventative health care 09/27/2014   Sees Dr Arrie Aran for GYN   Snoring 06/10/2017   Spondylolisthesis at L5-S1 level 07/14/2017    Tobacco History: Social History   Tobacco Use  Smoking Status Never  Smokeless Tobacco Never   Counseling given: Not  Answered   Outpatient Medications Prior to Visit  Medication Sig Dispense Refill   aspirin 81 MG chewable tablet Chew 81 mg by mouth daily.     aspirin-acetaminophen-caffeine (EXCEDRIN MIGRAINE) 250-250-65 MG tablet Take 2 tablets by mouth 2 (two) times daily as needed for headache.     benzonatate (TESSALON) 200 MG capsule Take 1 capsule (200 mg total) by mouth 2 (two) times daily as needed for cough. 20 capsule 0   Biotin 5000 MCG TABS Take 5,000 mcg by mouth daily.     cetirizine (ZYRTEC) 10 MG tablet Take 1 tablet (10 mg total) by mouth daily. 30 tablet 11   losartan-hydrochlorothiazide (HYZAAR) 100-12.5 MG tablet TAKE 1 TABLET BY MOUTH DAILY 90 tablet 0   Multiple Vitamin (MULTIVITAMIN) tablet Take 1 tablet by mouth daily.     valACYclovir (VALTREX) 1000 MG tablet Take 2 g by mouth 2 (two) times daily as needed. Cold sores.  6   VITAMIN D, CHOLECALCIFEROL, PO Take 1 tablet by mouth daily.     No facility-administered medications prior to visit.     Review of Systems:   Constitutional: No night sweats, fevers, chills, or lassitude. +excessive daytime fatigue, weight gain  HEENT: No headaches, difficulty swallowing, tooth/dental problems, or sore throat. No sneezing, itching, ear ache, nasal congestion, or post nasal drip CV:  No chest pain, orthopnea, PND, swelling in lower extremities, anasarca, dizziness, palpitations, syncope Resp: +snoring. No shortness of breath with exertion or at rest. No excess mucus or change in color of mucus. No productive or non-productive. No hemoptysis. No wheezing.  No chest wall deformity GI:  No heartburn, indigestion GU: No dysuria, change in color of urine, urgency or frequency MSK:  No joint pain or swelling.   Neuro: No gait abnormalities or memory impairment.  Psych: No depression or anxiety. Mood stable. +sleep disturbance    Physical Exam:  BP 120/80 (BP Location: Right Arm, Patient Position: Sitting, Cuff Size: Large)   Pulse 89    Temp 97.6 F (36.4 C) (Oral)   Ht 5\' 2"  (1.575 m)   Wt 226 lb (102.5 kg)   SpO2 98%   BMI 41.34 kg/m   GEN: Pleasant, interactive, well-appearing; obese; in no acute distress HEENT:  Normocephalic and atraumatic. PERRLA. Sclera white. Nasal turbinates pink, moist and patent bilaterally. No rhinorrhea present. Oropharynx pink and moist, without exudate or edema. No lesions, ulcerations, or postnasal drip. Mallampati III NECK:  Supple w/ fair ROM. No JVD present. Normal carotid impulses w/o bruits. Thyroid symmetrical with no goiter or nodules palpated. No lymphadenopathy.   CV: RRR, no m/r/g, no peripheral edema. Pulses intact, +2 bilaterally. No cyanosis, pallor or clubbing. PULMONARY:  Unlabored, regular breathing. Clear bilaterally A&P w/o wheezes/rales/rhonchi. No accessory muscle use.  GI: BS present and normoactive. Soft, non-tender to palpation. No organomegaly or masses detected.  MSK: No erythema, warmth or tenderness. Cap refil <2 sec all extrem. No deformities or joint swelling noted.  Neuro: A/Ox3. No focal deficits noted.  Skin: Warm, no lesions or rashe Psych: Normal affect and behavior. Judgement and thought content appropriate.     Lab Results:  CBC    Component Value Date/Time   WBC 8.7 05/30/2022 1310   RBC 4.20 05/30/2022 1310   HGB 12.9 05/30/2022 1310   HGB 13.2 09/16/2019 1354   HCT 37.9 05/30/2022 1310   HCT 40.5 09/16/2019 1354   PLT 401.0 (H) 05/30/2022 1310   PLT 414 09/16/2019 1354   MCV 90.3 05/30/2022 1310   MCV 93 09/16/2019 1354   MCH 30.1 09/16/2019 1354   MCH 30.4 07/07/2017 0944   MCHC 34.0 05/30/2022 1310   RDW 13.1 05/30/2022 1310   RDW 12.1 09/16/2019 1354   LYMPHSABS 2.4 05/30/2022 1310   LYMPHSABS 2.5 09/16/2019 1354   MONOABS 0.6 05/30/2022 1310   EOSABS 0.4 05/30/2022 1310   EOSABS 0.8 (H) 09/16/2019 1354   BASOSABS 0.0 05/30/2022 1310   BASOSABS 0.0 09/16/2019 1354    BMET    Component Value Date/Time   NA 139 05/30/2022  1310   NA 139 05/20/2019 1410   K 4.3 05/30/2022 1310   CL 100 05/30/2022 1310   CO2 31 05/30/2022 1310   GLUCOSE 98 05/30/2022 1310   BUN 16 05/30/2022 1310   BUN 12 05/20/2019 1410   CREATININE 0.67 05/30/2022 1310   CREATININE 0.70 11/17/2020 1606   CALCIUM 9.4 05/30/2022 1310   GFRNONAA 106 05/20/2019 1410   GFRAA 123 05/20/2019 1410    BNP    Component Value Date/Time   BNP 4 11/17/2020 1606     Imaging:  No results found.  Administration History     None           No data to display          No results found for: "NITRICOXIDE"      Assessment & Plan:   Mild obstructive sleep apnea Mild OSA. Discussed potential treatment options. She has opted to work on weight loss measures and utilize positional sleeping. Aware of safe driving practices. Will notify us if symptoms do not improve or worsen.  Patient Instructions  Your sleep study showed mild sleep apnea. Mild sleep apnea does not pose as significant health risks as more moderate to severe forms do. We discussed how untreated sleep apnea puts an individual at risk for cardiac arrhthymias, pulm HTN, DM, stroke and increases their risk for daytime accidents. We also briefly reviewed treatment options including weight loss, side sleeping position, oral appliance, CPAP therapy or referral to ENT for possible surgical options. You have opted for weight loss measures and side lying sleeping position.  Ambien 5 mg At bedtime as needed for sleep. Take immediately before bed. Make sure you have 7-8 hours in the bed after you take the medicine. May cause some AM grogginess. Do not drive after taking. If you have worsening snoring or daytime grogginess, we will need to readdress your sleep apnea as this may cause worsened sleep disordered breathing  Follow up in 6 weeks with Dr. Wynona Neat or Florentina Addison Zamyah Wiesman,NP. If symptoms worsen, please contact office for sooner follow up or seek emergency care.    Insomnia She has  very mild sleep apnea that will be treated with positional sleeping and ongoing weight loss measures. She has difficulties with sleep maintenance despite OTC melatonin and sleep aid. We will trial her on pharmacological therapy with ambien. Side effect and safety profile reviewed. Understands proper use of this medication. Will evaluate response at follow  up. Sleep hygiene reviewed.   Morbid obesity (HCC) BMI 41. Healthy weight loss encouraged. She is going to discuss with her PCP and also look at online weight loss programs    I spent 32 minutes of dedicated to the care of this patient on the date of this encounter to include pre-visit review of records, face-to-face time with the patient discussing conditions above, post visit ordering of testing, clinical documentation with the electronic health record, making appropriate referrals as documented, and communicating necessary findings to members of the patients care team.  Noemi Chapel, NP 11/04/2022  Pt aware and understands NP's role.

## 2022-11-04 NOTE — Assessment & Plan Note (Signed)
She has very mild sleep apnea that will be treated with positional sleeping and ongoing weight loss measures. She has difficulties with sleep maintenance despite OTC melatonin and sleep aid. We will trial her on pharmacological therapy with ambien. Side effect and safety profile reviewed. Understands proper use of this medication. Will evaluate response at follow up. Sleep hygiene reviewed.

## 2022-11-04 NOTE — Assessment & Plan Note (Signed)
Mild OSA. Discussed potential treatment options. She has opted to work on weight loss measures and utilize positional sleeping. Aware of safe driving practices. Will notify us if symptoms do not improve or worsen.  Patient Instructions  Your sleep study showed mild sleep apnea. Mild sleep apnea does not pose as significant health risks as more moderate to severe forms do. We discussed how untreated sleep apnea puts an individual at risk for cardiac arrhthymias, pulm HTN, DM, stroke and increases their risk for daytime accidents. We also briefly reviewed treatment options including weight loss, side sleeping position, oral appliance, CPAP therapy or referral to ENT for possible surgical options. You have opted for weight loss measures and side lying sleeping position.  Ambien 5 mg At bedtime as needed for sleep. Take immediately before bed. Make sure you have 7-8 hours in the bed after you take the medicine. May cause some AM grogginess. Do not drive after taking. If you have worsening snoring or daytime grogginess, we will need to readdress your sleep apnea as this may cause worsened sleep disordered breathing  Follow up in 6 weeks with Dr. Wynona Neat or Florentina Addison Sadonna Kotara,NP. If symptoms worsen, please contact office for sooner follow up or seek emergency care.

## 2022-11-04 NOTE — Assessment & Plan Note (Signed)
BMI 41. Healthy weight loss encouraged. She is going to discuss with her PCP and also look at online weight loss programs

## 2022-11-04 NOTE — Patient Instructions (Addendum)
Your sleep study showed mild sleep apnea. Mild sleep apnea does not pose as significant health risks as more moderate to severe forms do. We discussed how untreated sleep apnea puts an individual at risk for cardiac arrhthymias, pulm HTN, DM, stroke and increases their risk for daytime accidents. We also briefly reviewed treatment options including weight loss, side sleeping position, oral appliance, CPAP therapy or referral to ENT for possible surgical options. You have opted for weight loss measures and side lying sleeping position.  Ambien 5 mg At bedtime as needed for sleep. Take immediately before bed. Make sure you have 7-8 hours in the bed after you take the medicine. May cause some AM grogginess. Do not drive after taking. If you have worsening snoring or daytime grogginess, we will need to readdress your sleep apnea as this may cause worsened sleep disordered breathing  Follow up in 6 weeks with Dr. Wynona Neat or Florentina Addison Evi Mccomb,NP. If symptoms worsen, please contact office for sooner follow up or seek emergency care.

## 2022-12-16 ENCOUNTER — Ambulatory Visit: Payer: BC Managed Care – PPO | Admitting: Nurse Practitioner

## 2023-01-27 ENCOUNTER — Other Ambulatory Visit: Payer: Self-pay | Admitting: Family Medicine

## 2023-03-10 ENCOUNTER — Telehealth: Payer: Self-pay | Admitting: Neurology

## 2023-03-10 NOTE — Telephone Encounter (Signed)
No mention of this in patient's chart, please contact to discuss.

## 2023-03-10 NOTE — Telephone Encounter (Signed)
Called patient back to arrange follow up appointment. Patient stated she already has one scheduled for tomorrow (03/11/23) with Mardella Layman

## 2023-03-10 NOTE — Telephone Encounter (Signed)
Copied from CRM [id number]. Topic: Clinical - Medication Question >> Mar 10, 2023 10:19 AM Tammy Brennan wrote: Reason for CRM: Patient is calling to check the status of her medication Zepbound (not on med list) - the pharmacy has told the patient it required prior authorization.

## 2023-03-10 NOTE — Telephone Encounter (Signed)
Looks like pulmonology took Zepbound off her list back on 08/14/22.  We received a request from pharmacy for the 2.5mg  that plan does not cover and to initiate prior auth.   Ok to add back to list and do prior auth?  Her last visit was 09/04/22 for an acute visit

## 2023-03-10 NOTE — Telephone Encounter (Signed)
Called patient to set up appointment. No answer. Left voicemail.

## 2023-03-11 ENCOUNTER — Encounter: Payer: Self-pay | Admitting: Physician Assistant

## 2023-03-11 ENCOUNTER — Telehealth: Payer: Self-pay | Admitting: Physician Assistant

## 2023-03-11 ENCOUNTER — Ambulatory Visit (INDEPENDENT_AMBULATORY_CARE_PROVIDER_SITE_OTHER): Payer: BC Managed Care – PPO | Admitting: Physician Assistant

## 2023-03-11 DIAGNOSIS — Z23 Encounter for immunization: Secondary | ICD-10-CM | POA: Diagnosis not present

## 2023-03-11 DIAGNOSIS — I10 Essential (primary) hypertension: Secondary | ICD-10-CM | POA: Diagnosis not present

## 2023-03-11 DIAGNOSIS — E781 Pure hyperglyceridemia: Secondary | ICD-10-CM | POA: Diagnosis not present

## 2023-03-11 LAB — COMPREHENSIVE METABOLIC PANEL WITH GFR
ALT: 37 U/L — ABNORMAL HIGH (ref 0–35)
AST: 23 U/L (ref 0–37)
Albumin: 4.4 g/dL (ref 3.5–5.2)
Alkaline Phosphatase: 115 U/L (ref 39–117)
BUN: 19 mg/dL (ref 6–23)
CO2: 32 meq/L (ref 19–32)
Calcium: 10.1 mg/dL (ref 8.4–10.5)
Chloride: 100 meq/L (ref 96–112)
Creatinine, Ser: 0.68 mg/dL (ref 0.40–1.20)
GFR: 102.4 mL/min (ref >60.00)
Glucose, Bld: 113 mg/dL — ABNORMAL HIGH (ref 70–99)
Potassium: 4.3 meq/L (ref 3.5–5.1)
Sodium: 140 meq/L (ref 135–145)
Total Bilirubin: 0.6 mg/dL (ref 0.2–1.2)
Total Protein: 6.9 g/dL (ref 6.0–8.3)

## 2023-03-11 LAB — CBC WITH DIFFERENTIAL/PLATELET
Basophils Absolute: 0 10*3/uL (ref 0.0–0.1)
Basophils Relative: 0.4 % (ref 0.0–3.0)
Eosinophils Absolute: 0.5 10*3/uL (ref 0.0–0.7)
Eosinophils Relative: 5.5 % — ABNORMAL HIGH (ref 0.0–5.0)
HCT: 39.5 % (ref 36.0–46.0)
Hemoglobin: 13.3 g/dL (ref 12.0–15.0)
Lymphocytes Relative: 27.7 % (ref 12.0–46.0)
Lymphs Abs: 2.3 10*3/uL (ref 0.7–4.0)
MCHC: 33.8 g/dL (ref 30.0–36.0)
MCV: 91.6 fL (ref 78.0–100.0)
Monocytes Absolute: 0.6 10*3/uL (ref 0.1–1.0)
Monocytes Relative: 6.9 % (ref 3.0–12.0)
Neutro Abs: 5 10*3/uL (ref 1.4–7.7)
Neutrophils Relative %: 59.5 % (ref 43.0–77.0)
Platelets: 420 10*3/uL — ABNORMAL HIGH (ref 150.0–400.0)
RBC: 4.32 Mil/uL (ref 3.87–5.11)
RDW: 13.3 % (ref 11.5–15.5)
WBC: 8.4 10*3/uL (ref 4.0–10.5)

## 2023-03-11 LAB — LIPID PANEL
Cholesterol: 217 mg/dL — ABNORMAL HIGH (ref 0–200)
HDL: 61.2 mg/dL (ref >39.00)
LDL Cholesterol: 100 mg/dL — ABNORMAL HIGH (ref 0–99)
NonHDL: 155.67
Total CHOL/HDL Ratio: 4
Triglycerides: 276 mg/dL — ABNORMAL HIGH (ref 0.0–149.0)
VLDL: 55.2 mg/dL — ABNORMAL HIGH (ref 0.0–40.0)

## 2023-03-11 MED ORDER — ZEPBOUND 2.5 MG/0.5ML ~~LOC~~ SOAJ: 2.5000 mg | SUBCUTANEOUS | 0 refills | Status: DC

## 2023-03-11 NOTE — Patient Instructions (Addendum)
 Zepbound savings card [email or web address]

## 2023-03-11 NOTE — Progress Notes (Signed)
 Established patient visit   Patient: Tammy Brennan   DOB: 1975-08-04   47 y.o. Female  MRN: [id number] Visit Date: 03/11/2023  Today's healthcare provider: Manuelita Flatness, PA-C   Cc. Weight management  Subjective     Weight management Patient is interested in starting Zepbound  again now that the medications are not on backorder. She was on Saxenda for several months and last ~ 30 pounds. She switched to Zepbound , but was only taking for around a month before she was unable to get the medication.  She tolerated both medications well.  Medications: Outpatient Medications Prior to Visit  Medication Sig   aspirin  81 MG chewable tablet Chew 81 mg by mouth daily.   aspirin -acetaminophen -caffeine  (EXCEDRIN  MIGRAINE) 250-250-65 MG tablet Take 2 tablets by mouth 2 (two) times daily as needed for headache.   Biotin  5000 MCG TABS Take 5,000 mcg by mouth daily.   losartan -hydrochlorothiazide  (HYZAAR) 100-12.5 MG tablet Take 1 tablet by mouth daily.   Multiple Vitamin (MULTIVITAMIN) tablet Take 1 tablet by mouth daily.   valACYclovir (VALTREX) 1000 MG tablet Take 2 g by mouth 2 (two) times daily as needed. Cold sores.   VITAMIN D, CHOLECALCIFEROL, PO Take 1 tablet by mouth daily.   [DISCONTINUED] benzonatate  (TESSALON ) 200 MG capsule Take 1 capsule (200 mg total) by mouth 2 (two) times daily as needed for cough.   [DISCONTINUED] cetirizine  (ZYRTEC ) 10 MG tablet Take 1 tablet (10 mg total) by mouth daily.   [DISCONTINUED] zolpidem  (AMBIEN ) 5 MG tablet Take 1 tablet (5 mg total) by mouth at bedtime as needed for sleep.   No facility-administered medications prior to visit.    Review of Systems  Constitutional:  Negative for fatigue and fever.  Respiratory:  Negative for cough and shortness of breath.   Cardiovascular:  Negative for chest pain and leg swelling.  Gastrointestinal:  Negative for abdominal pain.  Neurological:  Negative for dizziness and headaches.       Objective     BP 132/84   Pulse 73   Temp 97.9 F (36.6 C) (Oral)   Ht 5' 2 (1.575 m)   Wt 230 lb 6 oz (104.5 kg)   SpO2 97%   BMI 42.14 kg/m    Physical Exam Vitals reviewed.  Constitutional:      Appearance: She is not ill-appearing.  HENT:     Head: Normocephalic.  Eyes:     Conjunctiva/sclera: Conjunctivae normal.  Cardiovascular:     Rate and Rhythm: Normal rate.  Pulmonary:     Effort: Pulmonary effort is normal. No respiratory distress.  Neurological:     General: No focal deficit present.     Mental Status: She is alert and oriented to person, place, and time.  Psychiatric:        Mood and Affect: Mood normal.        Behavior: Behavior normal.      No results found for any visits on 03/11/23.  Assessment & Plan    Morbid obesity (HCC) Assessment & Plan: Previous success with saxenda, tolerated zepbound  2.5 mg well but d/c d/t supply Will resend -- recommending 2.5 mg weekly x 4 weeks, then increasing to 5 mg weekly. Based on tolerance/efficacy can stay on 5 mg or increase further in another 4 weeks  Orders: -     Zepbound ; Inject 2.5 mg into the skin once a week.  Dispense: 2 mL; Refill: 0  Hypertriglyceridemia Assessment & Plan: Pt is fasting repeat lipids  Orders: -     Lipid panel -     Comprehensive metabolic panel  Essential hypertension Assessment & Plan: Moderately controlled with  Losartan  100 mg hydrochlorothiazide  12.5 mg  Ordering cmp  F/u 6 mo   Orders: -     Lipid panel -     Comprehensive metabolic panel -     CBC with Differential/Platelet  Immunization due -     Flu vaccine trivalent PF, 6mos and older(Flulaval,Afluria,Fluarix,Fluzone)    Return in about 3 months (around 06/09/2023) for weight Management.       Manuelita Flatness, PA-C  [facility] [phone] (phone) [phone] (fax)  [facility]

## 2023-03-11 NOTE — Assessment & Plan Note (Signed)
Moderately controlled with  Losartan 100 mg hydrochlorothiazide 12.5 mg  Ordering cmp  F/u 6 mo

## 2023-03-11 NOTE — Assessment & Plan Note (Signed)
Pt is fasting repeat lipids

## 2023-03-11 NOTE — Telephone Encounter (Signed)
 Started PA for Zepbound 2.5 mg  (Key: [id number])

## 2023-03-11 NOTE — Assessment & Plan Note (Signed)
Previous success with saxenda, tolerated zepbound 2.5 mg well but d/c d/t supply Will resend -- recommending 2.5 mg weekly x 4 weeks, then increasing to 5 mg weekly. Based on tolerance/efficacy can stay on 5 mg or increase further in another 4 weeks

## 2023-03-11 NOTE — Telephone Encounter (Signed)
Pt seen today and prior auth started

## 2023-03-17 NOTE — Telephone Encounter (Signed)
 Approved on March 11, 2023 by [employer] CaseId:[id number];Status:Approved;Review Type:Prior Auth;Coverage Start Date:02/09/2023;Coverage End Date:11/06/2023;;CaseId:[id number];Status:Approved;Review Type:Qty;Coverage Start Date:02/09/2023;Coverage End Date:04/10/2023; Authorization Expiration Date: 11/05/2023

## 2023-04-26 ENCOUNTER — Other Ambulatory Visit: Payer: Self-pay | Admitting: Family Medicine

## 2023-04-28 ENCOUNTER — Encounter: Payer: Self-pay | Admitting: Physician Assistant

## 2023-04-29 ENCOUNTER — Other Ambulatory Visit: Payer: Self-pay | Admitting: Physician Assistant

## 2023-04-29 MED ORDER — ZEPBOUND 5 MG/0.5ML ~~LOC~~ SOAJ: 5.0000 mg | SUBCUTANEOUS | 3 refills | Status: DC

## 2023-06-25 DIAGNOSIS — Z1231 Encounter for screening mammogram for malignant neoplasm of breast: Secondary | ICD-10-CM | POA: Diagnosis not present

## 2023-06-25 DIAGNOSIS — Z124 Encounter for screening for malignant neoplasm of cervix: Secondary | ICD-10-CM | POA: Diagnosis not present

## 2023-06-25 DIAGNOSIS — Z1331 Encounter for screening for depression: Secondary | ICD-10-CM | POA: Diagnosis not present

## 2023-06-25 DIAGNOSIS — Z01419 Encounter for gynecological examination (general) (routine) without abnormal findings: Secondary | ICD-10-CM | POA: Diagnosis not present

## 2023-06-25 LAB — HM PAP SMEAR: HPV, high-risk: NEGATIVE

## 2023-06-25 LAB — HM MAMMOGRAPHY

## 2023-07-04 ENCOUNTER — Ambulatory Visit: Admitting: Medical

## 2023-07-09 DIAGNOSIS — Z1212 Encounter for screening for malignant neoplasm of rectum: Secondary | ICD-10-CM | POA: Diagnosis not present

## 2023-07-10 ENCOUNTER — Encounter: Payer: Self-pay | Admitting: Physician Assistant

## 2023-07-13 ENCOUNTER — Other Ambulatory Visit: Payer: Self-pay | Admitting: Family Medicine

## 2023-07-13 MED ORDER — TIRZEPATIDE-WEIGHT MANAGEMENT 7.5 MG/0.5ML ~~LOC~~ SOLN: 7.5000 mg | SUBCUTANEOUS | 2 refills | Status: DC

## 2023-07-14 ENCOUNTER — Other Ambulatory Visit: Payer: Self-pay | Admitting: Family Medicine

## 2023-07-14 MED ORDER — ZEPBOUND 7.5 MG/0.5ML ~~LOC~~ SOAJ: 7.5000 mg | SUBCUTANEOUS | 2 refills | Status: DC

## 2023-07-14 NOTE — Telephone Encounter (Signed)
 Copied from CRM [id number]. Topic: Clinical - Medication Question >> Jul 14, 2023  1:18 PM Alyse July wrote: Reason for CRM: Patient would like Zepbound  at 7.5 mg sent over to her pharmacy. Patient states she check the pharmacy and medication has not been called in. Per chart Tirzepatide  was called in yesterday and per patient that was not the medication that was discuss. Please contact patient to advise or submit script for Zepbound  7.5 mg.

## 2023-07-16 LAB — EXTERNAL GENERIC LAB PROCEDURE: COLOGUARD: NEGATIVE

## 2023-07-16 LAB — COLOGUARD: COLOGUARD: NEGATIVE

## 2023-08-01 ENCOUNTER — Other Ambulatory Visit: Payer: Self-pay | Admitting: Family Medicine

## 2023-08-28 ENCOUNTER — Other Ambulatory Visit: Payer: Self-pay | Admitting: Family Medicine

## 2023-09-04 ENCOUNTER — Encounter: Payer: Self-pay | Admitting: Family Medicine

## 2023-09-05 ENCOUNTER — Encounter: Payer: Self-pay | Admitting: Student

## 2023-09-05 ENCOUNTER — Ambulatory Visit: Admitting: Student

## 2023-09-05 VITALS — BP 118/80 | HR 89 | Temp 97.8°F | Ht 62.0 in | Wt 220.0 lb

## 2023-09-05 DIAGNOSIS — I10 Essential (primary) hypertension: Secondary | ICD-10-CM

## 2023-09-05 DIAGNOSIS — G4709 Other insomnia: Secondary | ICD-10-CM | POA: Diagnosis not present

## 2023-09-05 DIAGNOSIS — E669 Obesity, unspecified: Secondary | ICD-10-CM

## 2023-09-05 MED ORDER — LOSARTAN POTASSIUM-HCTZ 100-12.5 MG PO TABS: 1.0000 | ORAL_TABLET | Freq: Every day | ORAL | 3 refills | Status: DC

## 2023-09-05 MED ORDER — TRAZODONE HCL 50 MG PO TABS: 25.0000 mg | ORAL_TABLET | Freq: Every evening | ORAL | 1 refills | Status: DC | PRN

## 2023-09-05 MED ORDER — ZEPBOUND 10 MG/0.5ML ~~LOC~~ SOAJ: 10.0000 mg | SUBCUTANEOUS | 1 refills | Status: DC

## 2023-09-05 NOTE — Assessment & Plan Note (Signed)
 Increase tirzepatide  (Zepbound ) to 10 mg weekly injection.  Encouraged DASH diet, decrease po intake and increase exercise as tolerated. Needs 7-8 hours of sleep nightly. Avoid trans fats, eat small, frequent meals every 4-5 hours with lean proteins, complex carbs and healthy fats. Minimize simple carbs, referred to healthy weight and wellness

## 2023-09-05 NOTE — Assessment & Plan Note (Signed)
 Referral sent for sleep study. Rx-trial of trazodone 25 to 50 mg as needed for insomnia. ?Maintain a regular sleep schedule, particularly a regular wake-up time in the morning ?Try not to force sleep ?Avoid caffeinated beverages after lunch ?Avoid alcohol near bedtime (eg, late afternoon and evening)  ?Avoid smoking or other nicotine intake, particularly during the evening ?Adjust the bedroom environment as needed to decrease stimuli (eg, reduce ambient light, turn off the television or radio) ?Avoid prolonged use of light-emitting screens (laptops, tablets, smartphones, ebooks) before bedtime  ?Resolve concerns or worries before bedtime ?Exercise regularly for at least 20 minutes, preferably more than four to five hours prior to bedtime  ?Avoid daytime naps, especially if they are longer than 20 to 30 minutes or occur late in the day

## 2023-09-05 NOTE — Progress Notes (Addendum)
 Subjective:     Patient ID: Tammy Brennan, female    DOB: 30-Aug-1975, 48 y.o.   MRN: [id number]  Chief Complaint  Patient presents with   Medication Problem    Want to discuss zepbound , don't feel it does anything for me so want to discuss other options   Medication Refill    Losartan -potassium    HPI  Tammy Brennan 48 year old female presents for follow-up.    She has been on tirzepatide  7.5 mg weekly and has been compliant.  She feels that the medication is not working as she is not losing much weight.  She has had a 10 pound weight loss within the last 6 months.   Presents with c/o fatigue.  Reports chronic fatigue and in the past was tested for OSA.  Reports previous sleep study stated she had mild OSA, and their recommendation at that time was weight loss.  Continues to struggle with fatigue stating that she is exhausted and can barely stay awake when she comes home from work.Additionally c/o insomnia, contributing to her daytime tiredness.  She has tried melatonin, and OTC supplements without relief of symptoms.  Hypertension Losartan -hydrochlorothiazide  100-12.5 mg daily, compliant.  Obesity Tirzepatide  (Zepbound ) 7.5 mg weekly, compliant with medication.  Wt Readings from Last 3 Encounters:  09/05/23 220 lb (99.8 kg)  03/11/23 230 lb 6 oz (104.5 kg)  11/04/22 226 lb (102.5 kg)   Patient denies fever, chills, SOB, CP, palpitations, dyspnea, edema, HA, vision changes, N/V/D, abdominal pain, urinary symptoms, rash, weight changes, and recent illness or hospitalizations.    History of Present Illness              Health Maintenance Due  Topic Date Due   Hepatitis B Vaccines (1 of 3 - 19+ 3-dose series) Never done   COVID-19 Vaccine (3 - 2024-25 season) 11/10/2022    Past Medical History:  Diagnosis Date   Ankle weakness 02/06/2021   Bilateral hand swelling 05/30/2022   BMI 40.0-44.9, adult (HCC) 05/30/2022   Carpal tunnel syndrome 06/20/2014    Complete tear of ligament of ankle 02/08/2021   Daytime somnolence 02/06/2021   Depression 07/27/2019   Dysrhythmia    PAPLITATIONS   Essential hypertension 06/10/2017   Fatty liver 02/06/2021   Headache    HX  MIGRAINES   Hyperlipidemia, mixed 10/10/2014   Hypertriglyceridemia 06/10/2017   Leg pain, right 06/20/2014   Lumbar degenerative disc disease 07/28/2014   Lumbar radiculopathy 07/28/2014   Mild obstructive sleep apnea 11/04/2022   Murmur, cardiac 05/30/2022   Neck pain 08/02/2016   Obesity (BMI 30-39.9) 10/10/2014   Palpitations 06/10/2017   PONV (postoperative nausea and vomiting)    Prediabetes 07/27/2019   Preventative health care 09/27/2014   Sees Dr Richard Tayvon for GYN   Snoring 06/10/2017   Spondylolisthesis at L5-S1 level 07/14/2017    Past Surgical History:  Procedure Laterality Date   ABDOMINAL EXPOSURE N/A 07/14/2017   Procedure: ABDOMINAL EXPOSURE;  Surgeon: Oris Krystal FALCON, MD;  [facility] OR;  Service: Vascular;  Laterality: N/A;   ANTERIOR LUMBAR FUSION N/A 07/14/2017   Procedure: Anterior Lumbar Interbody Fusion Lumbar five-Sacral one;  Surgeon: Onetha Kuba, MD;  Location: [facility] OR;  Service: Neurosurgery;  Laterality: N/A;   BTL     2012   CESAREAN SECTION     X2    Family History  Problem Relation Age of Onset   Hypertension Mother    Hyperlipidemia Mother    Hypertension Father  Cancer Father        lung, previous light smoker   Alcoholism Father    Alcohol abuse Maternal Grandfather    Heart disease Maternal Grandfather        MI   Dementia Paternal Grandfather    Hypertension Sister     Social History   Socioeconomic History   Marital status: Married    Spouse name: Not on file   Number of children: Not on file   Years of education: Not on file   Highest education level: Some college, no degree  Occupational History   Occupation: Diplomatic Services operational officer  Tobacco Use   Smoking status: Never   Smokeless tobacco: Never  Vaping Use    Vaping status: Never Used  Substance and Sexual Activity   Alcohol use: No    Alcohol/week: 0.0 standard drinks of alcohol   Drug use: No   Sexual activity: Yes    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, no dietary restrictions, 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[employer]  Other Topics Concern   Not on file  Social History Narrative   Works as a Geologist, engineering first grade   2 children   Married   Enjoys watching her kids sports and going to the [employer]   Social Drivers of [employer]: Low Risk  (03/10/2023)   Overall Financial Resource Strain (CARDIA)    Difficulty of Paying Living Expenses: Not hard at all  Food Insecurity: No Food Insecurity (03/10/2023)   Hunger Vital Sign    Worried About Running Out of Food in the Last Year: Never true    Ran Out of Food in the Last Year: Never true  Transportation Needs: No Transportation Needs (03/10/2023)   PRAPARE - Administrator, Civil Service (Medical): No    Lack of Transportation (Non-Medical): No  Physical Activity: Unknown (03/10/2023)   Exercise Vital Sign    Days of Exercise per Week: 0 days    Minutes of Exercise per Session: Not on file  Stress: No Stress Concern Present (03/10/2023)   [employer] of Occupational Health - Occupational Stress Questionnaire    Feeling of Stress : Not at all  Social Connections: Moderately Integrated (03/10/2023)   Social Connection and Isolation Panel    Frequency of Communication with Friends and Family: More than three times a week    Frequency of Social Gatherings with Friends and Family: Once a week    Attends Religious Services: More than 4 times per year    Active Member of [employer] or Organizations: No    Attends Engineer, structural: Not on file    Marital Status: Married  Catering manager Violence: Not on file    Outpatient Medications Prior to Visit  Medication Sig Dispense Refill   aspirin  81 MG chewable tablet Chew 81 mg by mouth daily.      aspirin -acetaminophen -caffeine  (EXCEDRIN  MIGRAINE) 250-250-65 MG tablet Take 2 tablets by mouth 2 (two) times daily as needed for headache.     Biotin  5000 MCG TABS Take 5,000 mcg by mouth daily.     Multiple Vitamin (MULTIVITAMIN) tablet Take 1 tablet by mouth daily.     valACYclovir (VALTREX) 1000 MG tablet Take 2 g by mouth 2 (two) times daily as needed. Cold sores.  6   VITAMIN D, CHOLECALCIFEROL, PO Take 1 tablet by mouth daily.     losartan -hydrochlorothiazide  (HYZAAR) 100-12.5 MG tablet Take 1 tablet by mouth daily. 30 tablet 0   tirzepatide  (ZEPBOUND )  7.5 MG/0.5ML Pen Inject 7.5 mg into the skin once a week. (Patient not taking: Reported on 09/05/2023) 2 mL 2   No facility-administered medications prior to visit.    No Known Allergies  ROS See HPI    Objective:    Physical Exam  General: No acute distress. Awake and conversant.  Eyes: Normal conjunctiva, anicteric. Round symmetric pupils.  ENT: Hearing grossly intact. No nasal discharge.  Neck: Neck is supple. No masses or thyromegaly.  Respiratory: CTAB. Respirations are non-labored. No wheezing.  Skin: Warm. No rashes or ulcers.  Psych: Alert and oriented. Cooperative, Appropriate mood and affect, Normal judgment.  CV: RRR. No murmur. No lower extremity edema.  MSK: Normal ambulation. No clubbing or cyanosis.  Neuro:  CN II-XII grossly normal.     BP 118/80   Pulse 89   Temp 97.8 F (36.6 C)   Ht 5' 2 (1.575 m)   Wt 220 lb (99.8 kg)   SpO2 98%   BMI 40.24 kg/m  Wt Readings from Last 3 Encounters:  09/05/23 220 lb (99.8 kg)  03/11/23 230 lb 6 oz (104.5 kg)  11/04/22 226 lb (102.5 kg)       Assessment & Plan:   Problem List Items Addressed This Visit     Essential hypertension   Well controlled, no changes to meds. Encouraged heart healthy diet such as the DASH diet and exercise as tolerated.        Relevant Medications   losartan -hydrochlorothiazide  (HYZAAR) 100-12.5 MG tablet   Insomnia - Primary    Referral sent for sleep study. Rx-trial of trazodone 25 to 50 mg as needed for insomnia. ?Maintain a regular sleep schedule, particularly a regular wake-up time in the morning ?Try not to force sleep ?Avoid caffeinated beverages after lunch ?Avoid alcohol near bedtime (eg, late afternoon and evening)  ?Avoid smoking or other nicotine intake, particularly during the evening ?Adjust the bedroom environment as needed to decrease stimuli (eg, reduce ambient light, turn off the television or radio) ?Avoid prolonged use of light-emitting screens (laptops, tablets, smartphones, ebooks) before bedtime  ?Resolve concerns or worries before bedtime ?Exercise regularly for at least 20 minutes, preferably more than four to five hours prior to bedtime  ?Avoid daytime naps, especially if they are longer than 20 to 30 minutes or occur late in the day       Relevant Medications   traZODone (DESYREL) 50 MG tablet   Other Relevant Orders   Ambulatory referral to Sleep Studies   Obesity (BMI 30-39.9)   Increase tirzepatide  (Zepbound ) to 10 mg weekly injection.  Encouraged DASH diet, decrease po intake and increase exercise as tolerated. Needs 7-8 hours of sleep nightly. Avoid trans fats, eat small, frequent meals every 4-5 hours with lean proteins, complex carbs and healthy fats. Minimize simple carbs, referred to healthy weight and wellness        Relevant Medications   tirzepatide  (ZEPBOUND ) 10 MG/0.5ML Pen (Start on 11/28/2023)  FU 2 months CPE  Patient was educated on the diagnosis, treatment options, potential risks, benefits, and alternatives. All questions were addressed. Patient verbalized understanding and agrees with the plan of care. Will follow up as advised or sooner if symptoms worsen or new concerns arise.   Portions of this note were dictated using DRAGON voice recognition software. Please disregard any errors in transcription.     I have discontinued Tammy Brennan's Zepbound . I am  also having her start on Zepbound  and traZODone. Additionally, I am having her  maintain her Biotin , aspirin -acetaminophen -caffeine , valACYclovir, multivitamin, aspirin , (VITAMIN D, CHOLECALCIFEROL, PO), and losartan -hydrochlorothiazide .  Meds ordered this encounter  Medications   tirzepatide  (ZEPBOUND ) 10 MG/0.5ML Pen    Sig: Inject 10 mg into the skin once a week.    Dispense:  2 mL    Refill:  1    Supervising Provider:   DOMENICA BLACKBIRD A [[id number]]   losartan -hydrochlorothiazide  (HYZAAR) 100-12.5 MG tablet    Sig: Take 1 tablet by mouth daily.    Dispense:  30 tablet    Refill:  3    Requested drug refills are authorized, however, the patient needs further evaluation and/or laboratory testing before further refills are given. Ask her to make an appointment for this.    Supervising Provider:   DOMENICA BLACKBIRD A [[id number]]   traZODone (DESYREL) 50 MG tablet    Sig: Take 0.5-1 tablets (25-50 mg total) by mouth at bedtime as needed for sleep.    Dispense:  30 tablet    Refill:  1    Supervising Provider:   DOMENICA BLACKBIRD A [[id number]]

## 2023-09-05 NOTE — Assessment & Plan Note (Signed)
 Well controlled, no changes to meds. Encouraged heart healthy diet such as the DASH diet and exercise as tolerated.

## 2023-10-08 ENCOUNTER — Other Ambulatory Visit (HOSPITAL_COMMUNITY): Payer: Self-pay

## 2023-11-05 ENCOUNTER — Encounter: Admitting: Student

## 2023-11-07 ENCOUNTER — Encounter: Admitting: Student

## 2023-11-11 ENCOUNTER — Encounter: Payer: Self-pay | Admitting: Family Medicine

## 2023-11-11 ENCOUNTER — Telehealth: Admitting: Physician Assistant

## 2023-11-11 DIAGNOSIS — B9689 Other specified bacterial agents as the cause of diseases classified elsewhere: Secondary | ICD-10-CM

## 2023-11-11 DIAGNOSIS — J019 Acute sinusitis, unspecified: Secondary | ICD-10-CM | POA: Diagnosis not present

## 2023-11-11 DIAGNOSIS — E669 Obesity, unspecified: Secondary | ICD-10-CM

## 2023-11-11 MED ORDER — AMOXICILLIN-POT CLAVULANATE 875-125 MG PO TABS: 1.0000 | ORAL_TABLET | Freq: Two times a day (BID) | ORAL | 0 refills | Status: AC

## 2023-11-11 NOTE — Progress Notes (Signed)
 I have spent 5 minutes in review of e-visit questionnaire, review and updating patient chart, medical decision making and response to patient.   Elsie Velma Lunger, PA-C

## 2023-11-11 NOTE — Progress Notes (Signed)

## 2023-11-12 ENCOUNTER — Other Ambulatory Visit: Payer: Self-pay | Admitting: Family Medicine

## 2023-11-12 MED ORDER — ZEPBOUND 10 MG/0.5ML ~~LOC~~ SOAJ: 10.0000 mg | SUBCUTANEOUS | 1 refills | Status: DC

## 2023-11-12 NOTE — Telephone Encounter (Signed)
 Patient called back to check on the status of refill(Zepbound ) and was advised that medication is still currently awaiting approval from the provider. Patient states refill request was sent by the pharmacy on Wednesday 11/05/23. No record of incoming request. Patient advise refill request was submitted 11/11/23 and still within the refill processing time window and may or may not be acknowledge by provider today but advise to follow up with the pharmacy for the status of refill.

## 2023-11-14 ENCOUNTER — Encounter: Admitting: Student

## 2023-11-14 ENCOUNTER — Telehealth: Admitting: Physician Assistant

## 2023-11-14 DIAGNOSIS — R051 Acute cough: Secondary | ICD-10-CM

## 2023-11-14 MED ORDER — BENZONATATE 100 MG PO CAPS
100.0000 mg | ORAL_CAPSULE | Freq: Three times a day (TID) | ORAL | 0 refills | Status: AC | PRN
Start: 2023-11-14 — End: ?

## 2023-11-14 MED ORDER — PREDNISONE 10 MG (21) PO TBPK: ORAL_TABLET | ORAL | 0 refills | Status: AC

## 2023-11-14 NOTE — Progress Notes (Signed)
 E-Visit for Cough   We are sorry that you are not feeling well.  Here is how we plan to help!  Based on your presentation I believe you most likely have acute cough with current URI. Since you are already on Augmentin , continue that.     In addition you may use A non-prescription cough medication called Mucinex DM: take 2 tablets every 12 hours. and A prescription cough medication called Tessalon  Perles 100mg . You may take 1-2 capsules every 8 hours as needed for your cough.  I am also prescribing: Prednisone  10 mg daily for 6 days (see taper instructions below)  Directions for 6 day taper: Day 1: 2 tablets before breakfast, 1 after both lunch & dinner and 2 at bedtime Day 2: 1 tab before breakfast, 1 after both lunch & dinner and 2 at bedtime Day 3: 1 tab at each meal & 1 at bedtime Day 4: 1 tab at breakfast, 1 at lunch, 1 at bedtime Day 5: 1 tab at breakfast & 1 tab at bedtime Day 6: 1 tab at breakfast  From your responses in the eVisit questionnaire you describe inflammation in the upper respiratory tract which is causing a significant cough.  This is commonly called Bronchitis and has four common causes:   Allergies Viral Infections Acid Reflux Bacterial Infection Allergies, viruses and acid reflux are treated by controlling symptoms or eliminating the cause. An example might be a cough caused by taking certain blood pressure medications. You stop the cough by changing the medication. Another example might be a cough caused by acid reflux. Controlling the reflux helps control the cough.     HOME CARE Only take medications as instructed by your medical team. Complete the entire course of an antibiotic. Drink plenty of fluids and get plenty of rest. Avoid close contacts especially the very young and the elderly Cover your mouth if you cough or cough into your sleeve. Always remember to wash your hands A steam or ultrasonic humidifier can help congestion.   GET HELP RIGHT AWAY  IF: You develop worsening fever. You become short of breath You cough up blood. Your symptoms persist after you have completed your treatment plan MAKE SURE YOU  Understand these instructions. Will watch your condition. Will get help right away if you are not doing well or get worse.    Thank you for choosing an e-visit.  Your e-visit answers were reviewed by a board certified advanced clinical practitioner to complete your personal care plan. Depending upon the condition, your plan could have included both over the counter or prescription medications.  Please review your pharmacy choice. Make sure the pharmacy is open so you can pick up prescription now. If there is a problem, you may contact your provider through [employer] and have the prescription routed to another pharmacy.  Your safety is important to us . If you have drug allergies check your prescription carefully.   For the next 24 hours you can use MyChart to ask questions about today's visit, request a non-urgent call back, or ask for a work or school excuse. You will get an email in the next two days asking about your experience. I hope that your e-visit has been valuable and will speed your recovery.   I have spent 5 minutes in review of e-visit questionnaire, review and updating patient chart, medical decision making and response to patient.   Delon CHRISTELLA Dickinson, PA-C

## 2023-11-18 ENCOUNTER — Other Ambulatory Visit: Payer: Self-pay

## 2023-11-18 ENCOUNTER — Telehealth: Payer: Self-pay

## 2023-11-18 ENCOUNTER — Other Ambulatory Visit (HOSPITAL_COMMUNITY): Payer: Self-pay

## 2023-11-18 DIAGNOSIS — E669 Obesity, unspecified: Secondary | ICD-10-CM

## 2023-11-18 MED ORDER — ZEPBOUND 10 MG/0.5ML ~~LOC~~ SOAJ: 10.0000 mg | SUBCUTANEOUS | 1 refills | Status: DC

## 2023-11-18 MED ORDER — ZEPBOUND 10 MG/0.5ML ~~LOC~~ SOAJ: 10.0000 mg | SUBCUTANEOUS | 1 refills | Status: AC

## 2023-11-18 NOTE — Telephone Encounter (Signed)
 Pharmacy Patient Advocate Encounter   Received notification from CoverMyMeds that prior authorization for Zepbound  10MG /0.5ML pen-injectors  is required/requested.   Insurance verification completed.   The patient is insured through [employer] .   Per test claim: PA required; PA submitted to above mentioned insurance via Latent Key/confirmation #/EOC [id number] Status is pending

## 2023-11-19 ENCOUNTER — Other Ambulatory Visit (HOSPITAL_COMMUNITY): Payer: Self-pay

## 2023-11-19 NOTE — Telephone Encounter (Signed)
 Pharmacy Patient Advocate Encounter  Received notification from [facility] that Prior Authorization for Zepbound  10MG /0.5ML pen-injectors  has been APPROVED from 10/20/2023 to 11/18/2024. Ran test claim, Copay is $35.00. This test claim was processed through [facility]- copay amounts may vary at other pharmacies due to pharmacy/plan contracts, or as the patient moves through the different stages of their insurance plan.   PA #/Case ID/Reference #: [id number]

## 2023-12-19 DIAGNOSIS — Z23 Encounter for immunization: Secondary | ICD-10-CM | POA: Diagnosis not present

## 2024-01-05 ENCOUNTER — Other Ambulatory Visit: Payer: Self-pay | Admitting: *Deleted

## 2024-01-05 ENCOUNTER — Encounter: Payer: Self-pay | Admitting: Family Medicine

## 2024-01-05 DIAGNOSIS — I10 Essential (primary) hypertension: Secondary | ICD-10-CM

## 2024-01-05 MED ORDER — LOSARTAN POTASSIUM-HCTZ 100-12.5 MG PO TABS: 1.0000 | ORAL_TABLET | Freq: Every day | ORAL | 3 refills | Status: DC

## 2024-02-11 ENCOUNTER — Encounter: Payer: Self-pay | Admitting: Family Medicine

## 2024-04-05 ENCOUNTER — Other Ambulatory Visit: Payer: Self-pay | Admitting: Family Medicine

## 2024-04-05 DIAGNOSIS — I10 Essential (primary) hypertension: Secondary | ICD-10-CM
